# Patient Record
Sex: Male | Born: 1945 | Race: White | Hispanic: No | Marital: Married | Smoking: Former smoker
Health system: Southern US, Community
[De-identification: ages and names within clinical notes are randomized; demographics above are authoritative.]

## PROBLEM LIST (undated history)

## (undated) DIAGNOSIS — I2510 Atherosclerotic heart disease of native coronary artery without angina pectoris: Secondary | ICD-10-CM

## (undated) DIAGNOSIS — R0902 Hypoxemia: Secondary | ICD-10-CM

## (undated) DIAGNOSIS — G4730 Sleep apnea, unspecified: Secondary | ICD-10-CM

## (undated) DIAGNOSIS — E785 Hyperlipidemia, unspecified: Secondary | ICD-10-CM

## (undated) DIAGNOSIS — K810 Acute cholecystitis: Secondary | ICD-10-CM

## (undated) DIAGNOSIS — Z9981 Dependence on supplemental oxygen: Secondary | ICD-10-CM

## (undated) DIAGNOSIS — D509 Iron deficiency anemia, unspecified: Secondary | ICD-10-CM

## (undated) DIAGNOSIS — I5032 Chronic diastolic (congestive) heart failure: Secondary | ICD-10-CM

## (undated) DIAGNOSIS — J449 Chronic obstructive pulmonary disease, unspecified: Secondary | ICD-10-CM

## (undated) DIAGNOSIS — G4733 Obstructive sleep apnea (adult) (pediatric): Secondary | ICD-10-CM

## (undated) DIAGNOSIS — M1990 Unspecified osteoarthritis, unspecified site: Secondary | ICD-10-CM

## (undated) DIAGNOSIS — Z9842 Cataract extraction status, left eye: Secondary | ICD-10-CM

## (undated) DIAGNOSIS — Z952 Presence of prosthetic heart valve: Secondary | ICD-10-CM

## (undated) DIAGNOSIS — C801 Malignant (primary) neoplasm, unspecified: Secondary | ICD-10-CM

## (undated) DIAGNOSIS — I779 Disorder of arteries and arterioles, unspecified: Secondary | ICD-10-CM

## (undated) DIAGNOSIS — I5030 Unspecified diastolic (congestive) heart failure: Secondary | ICD-10-CM

## (undated) DIAGNOSIS — Z9841 Cataract extraction status, right eye: Secondary | ICD-10-CM

## (undated) DIAGNOSIS — I350 Nonrheumatic aortic (valve) stenosis: Secondary | ICD-10-CM

## (undated) DIAGNOSIS — I700 Atherosclerosis of aorta: Secondary | ICD-10-CM

## (undated) DIAGNOSIS — E039 Hypothyroidism, unspecified: Secondary | ICD-10-CM

## (undated) DIAGNOSIS — I10 Essential (primary) hypertension: Secondary | ICD-10-CM

## (undated) HISTORY — PX: CATARACT EXTRACTION: SUR2

## (undated) HISTORY — PX: EYE SURGERY: SHX253

## (undated) HISTORY — DX: Atherosclerotic heart disease of native coronary artery without angina pectoris: I25.10

## (undated) HISTORY — DX: Chronic diastolic (congestive) heart failure: I50.32

## (undated) HISTORY — DX: Nonrheumatic aortic (valve) stenosis: I35.0

## (undated) HISTORY — PX: VASECTOMY: SHX75

## (undated) HISTORY — DX: Chronic obstructive pulmonary disease, unspecified: J44.9

## (undated) HISTORY — DX: Hypothyroidism, unspecified: E03.9

## (undated) HISTORY — PX: CARDIAC CATHETERIZATION: SHX172

---

## 2008-08-24 DIAGNOSIS — D039 Melanoma in situ, unspecified: Secondary | ICD-10-CM

## 2008-08-24 HISTORY — DX: Melanoma in situ, unspecified: D03.9

## 2016-07-23 ENCOUNTER — Other Ambulatory Visit: Payer: Self-pay | Admitting: Family Medicine

## 2016-07-23 ENCOUNTER — Ambulatory Visit
Admission: RE | Admit: 2016-07-23 | Discharge: 2016-07-23 | Disposition: A | Discharge status: Home / Self Care | Payer: Medicare Other | Source: Ambulatory Visit | Attending: Family Medicine | Admitting: Family Medicine

## 2016-07-23 DIAGNOSIS — J9811 Atelectasis: Secondary | ICD-10-CM | POA: Diagnosis not present

## 2016-07-23 DIAGNOSIS — R0602 Shortness of breath: Secondary | ICD-10-CM

## 2016-07-23 DIAGNOSIS — S2241XA Multiple fractures of ribs, right side, initial encounter for closed fracture: Secondary | ICD-10-CM | POA: Insufficient documentation

## 2018-10-08 ENCOUNTER — Emergency Department: Payer: Medicare Other

## 2018-10-08 ENCOUNTER — Other Ambulatory Visit: Payer: Self-pay

## 2018-10-08 ENCOUNTER — Encounter: Payer: Self-pay | Admitting: Emergency Medicine

## 2018-10-08 ENCOUNTER — Inpatient Hospital Stay
Admission: EM | Admit: 2018-10-08 | Discharge: 2018-10-14 | DRG: 286 | Disposition: A | Discharge status: Home / Self Care | Payer: Medicare Other | Attending: Internal Medicine | Admitting: Internal Medicine

## 2018-10-08 DIAGNOSIS — R9431 Abnormal electrocardiogram [ECG] [EKG]: Secondary | ICD-10-CM | POA: Diagnosis not present

## 2018-10-08 DIAGNOSIS — J9690 Respiratory failure, unspecified, unspecified whether with hypoxia or hypercapnia: Secondary | ICD-10-CM

## 2018-10-08 DIAGNOSIS — J449 Chronic obstructive pulmonary disease, unspecified: Secondary | ICD-10-CM | POA: Diagnosis present

## 2018-10-08 DIAGNOSIS — E039 Hypothyroidism, unspecified: Secondary | ICD-10-CM | POA: Diagnosis present

## 2018-10-08 DIAGNOSIS — Z8249 Family history of ischemic heart disease and other diseases of the circulatory system: Secondary | ICD-10-CM

## 2018-10-08 DIAGNOSIS — I959 Hypotension, unspecified: Secondary | ICD-10-CM | POA: Diagnosis present

## 2018-10-08 DIAGNOSIS — I2510 Atherosclerotic heart disease of native coronary artery without angina pectoris: Secondary | ICD-10-CM | POA: Diagnosis present

## 2018-10-08 DIAGNOSIS — I5033 Acute on chronic diastolic (congestive) heart failure: Secondary | ICD-10-CM | POA: Diagnosis not present

## 2018-10-08 DIAGNOSIS — E871 Hypo-osmolality and hyponatremia: Secondary | ICD-10-CM | POA: Diagnosis present

## 2018-10-08 DIAGNOSIS — J101 Influenza due to other identified influenza virus with other respiratory manifestations: Secondary | ICD-10-CM | POA: Diagnosis present

## 2018-10-08 DIAGNOSIS — Z79890 Hormone replacement therapy (postmenopausal): Secondary | ICD-10-CM

## 2018-10-08 DIAGNOSIS — I248 Other forms of acute ischemic heart disease: Secondary | ICD-10-CM | POA: Diagnosis present

## 2018-10-08 DIAGNOSIS — A419 Sepsis, unspecified organism: Secondary | ICD-10-CM

## 2018-10-08 DIAGNOSIS — I110 Hypertensive heart disease with heart failure: Principal | ICD-10-CM | POA: Diagnosis present

## 2018-10-08 DIAGNOSIS — K5900 Constipation, unspecified: Secondary | ICD-10-CM | POA: Diagnosis present

## 2018-10-08 DIAGNOSIS — Z79899 Other long term (current) drug therapy: Secondary | ICD-10-CM

## 2018-10-08 DIAGNOSIS — I5023 Acute on chronic systolic (congestive) heart failure: Secondary | ICD-10-CM | POA: Diagnosis present

## 2018-10-08 DIAGNOSIS — Z6831 Body mass index (BMI) 31.0-31.9, adult: Secondary | ICD-10-CM

## 2018-10-08 DIAGNOSIS — E669 Obesity, unspecified: Secondary | ICD-10-CM | POA: Diagnosis present

## 2018-10-08 DIAGNOSIS — I5031 Acute diastolic (congestive) heart failure: Secondary | ICD-10-CM

## 2018-10-08 DIAGNOSIS — J9601 Acute respiratory failure with hypoxia: Secondary | ICD-10-CM | POA: Diagnosis not present

## 2018-10-08 DIAGNOSIS — I350 Nonrheumatic aortic (valve) stenosis: Secondary | ICD-10-CM

## 2018-10-08 DIAGNOSIS — I10 Essential (primary) hypertension: Secondary | ICD-10-CM | POA: Diagnosis not present

## 2018-10-08 DIAGNOSIS — J9621 Acute and chronic respiratory failure with hypoxia: Secondary | ICD-10-CM | POA: Diagnosis present

## 2018-10-08 DIAGNOSIS — Z880 Allergy status to penicillin: Secondary | ICD-10-CM | POA: Diagnosis not present

## 2018-10-08 DIAGNOSIS — Z87891 Personal history of nicotine dependence: Secondary | ICD-10-CM

## 2018-10-08 HISTORY — DX: Essential (primary) hypertension: I10

## 2018-10-08 HISTORY — DX: Respiratory failure, unspecified, unspecified whether with hypoxia or hypercapnia: J96.90

## 2018-10-08 LAB — INFLUENZA PANEL BY PCR (TYPE A & B)
Influenza A By PCR: POSITIVE — AB
Influenza B By PCR: NEGATIVE

## 2018-10-08 LAB — CBC WITH DIFFERENTIAL/PLATELET
Abs Immature Granulocytes: 0.01 10*3/uL (ref 0.00–0.07)
BASOS PCT: 1 %
Basophils Absolute: 0 10*3/uL (ref 0.0–0.1)
Eosinophils Absolute: 0 10*3/uL (ref 0.0–0.5)
Eosinophils Relative: 1 %
HCT: 48.8 % (ref 39.0–52.0)
Hemoglobin: 15.8 g/dL (ref 13.0–17.0)
Immature Granulocytes: 0 %
Lymphocytes Relative: 5 %
Lymphs Abs: 0.3 10*3/uL — ABNORMAL LOW (ref 0.7–4.0)
MCH: 29.5 pg (ref 26.0–34.0)
MCHC: 32.4 g/dL (ref 30.0–36.0)
MCV: 91.2 fL (ref 80.0–100.0)
MONOS PCT: 13 %
Monocytes Absolute: 0.7 10*3/uL (ref 0.1–1.0)
NEUTROS PCT: 80 %
Neutro Abs: 4.6 10*3/uL (ref 1.7–7.7)
Platelets: 243 10*3/uL (ref 150–400)
RBC: 5.35 MIL/uL (ref 4.22–5.81)
RDW: 12.6 % (ref 11.5–15.5)
WBC: 5.7 10*3/uL (ref 4.0–10.5)
nRBC: 0 % (ref 0.0–0.2)

## 2018-10-08 LAB — URINALYSIS, COMPLETE (UACMP) WITH MICROSCOPIC
Bacteria, UA: NONE SEEN
Bilirubin Urine: NEGATIVE
Glucose, UA: NEGATIVE mg/dL
Ketones, ur: NEGATIVE mg/dL
Leukocytes,Ua: NEGATIVE
Nitrite: NEGATIVE
PROTEIN: NEGATIVE mg/dL
Specific Gravity, Urine: 1.016 (ref 1.005–1.030)
Squamous Epithelial / HPF: NONE SEEN (ref 0–5)
pH: 6 (ref 5.0–8.0)

## 2018-10-08 LAB — COMPREHENSIVE METABOLIC PANEL
ALT: 12 U/L (ref 0–44)
AST: 20 U/L (ref 15–41)
Albumin: 3.9 g/dL (ref 3.5–5.0)
Alkaline Phosphatase: 50 U/L (ref 38–126)
Anion gap: 7 (ref 5–15)
BUN: 16 mg/dL (ref 8–23)
CO2: 35 mmol/L — ABNORMAL HIGH (ref 22–32)
Calcium: 8.5 mg/dL — ABNORMAL LOW (ref 8.9–10.3)
Chloride: 84 mmol/L — ABNORMAL LOW (ref 98–111)
Creatinine, Ser: 0.95 mg/dL (ref 0.61–1.24)
GFR calc Af Amer: >60 mL/min (ref >60)
GFR calc non Af Amer: >60 mL/min (ref >60)
Glucose, Bld: 91 mg/dL (ref 70–99)
Potassium: 3.7 mmol/L (ref 3.5–5.1)
Sodium: 126 mmol/L — ABNORMAL LOW (ref 135–145)
Total Bilirubin: 1.4 mg/dL — ABNORMAL HIGH (ref 0.3–1.2)
Total Protein: 6.9 g/dL (ref 6.5–8.1)

## 2018-10-08 LAB — LACTIC ACID, PLASMA: LACTIC ACID, VENOUS: 1.6 mmol/L (ref 0.5–1.9)

## 2018-10-08 LAB — PROTIME-INR
INR: 0.98
Prothrombin Time: 12.9 seconds (ref 11.4–15.2)

## 2018-10-08 LAB — TSH: TSH: 0.807 u[IU]/mL (ref 0.350–4.500)

## 2018-10-08 LAB — TROPONIN I
Troponin I: 0.05 ng/mL (ref <0.03)
Troponin I: 0.06 ng/mL (ref <0.03)
Troponin I: 0.05 ng/mL (ref <0.03)

## 2018-10-08 LAB — BRAIN NATRIURETIC PEPTIDE: B Natriuretic Peptide: 823 pg/mL — ABNORMAL HIGH (ref 0.0–100.0)

## 2018-10-08 MED ORDER — ORAL CARE MOUTH RINSE
15.0000 mL | Freq: Two times a day (BID) | OROMUCOSAL | Status: DC
  Administered 2018-10-09 – 2018-10-12 (×4): 15 mL via OROMUCOSAL

## 2018-10-08 MED ORDER — OSELTAMIVIR PHOSPHATE 75 MG PO CAPS
75.0000 mg | ORAL_CAPSULE | Freq: Two times a day (BID) | ORAL | Status: AC
  Administered 2018-10-08 – 2018-10-12 (×10): 75 mg via ORAL
  Filled 2018-10-08 (×10): qty 1

## 2018-10-08 MED ORDER — ONDANSETRON HCL 4 MG/2ML IJ SOLN: 4.0000 mg | Freq: Four times a day (QID) | INTRAMUSCULAR | Status: DC | PRN

## 2018-10-08 MED ORDER — SODIUM CHLORIDE 0.9 % IV BOLUS
500.0000 mL | Freq: Once | INTRAVENOUS | Status: AC
  Administered 2018-10-08: 500 mL via INTRAVENOUS

## 2018-10-08 MED ORDER — METHYLPREDNISOLONE SODIUM SUCC 125 MG IJ SOLR
125.0000 mg | Freq: Once | INTRAMUSCULAR | Status: AC
  Administered 2018-10-08: 125 mg via INTRAVENOUS
  Filled 2018-10-08: qty 2

## 2018-10-08 MED ORDER — SODIUM CHLORIDE 0.9% FLUSH
3.0000 mL | Freq: Two times a day (BID) | INTRAVENOUS | Status: DC
  Administered 2018-10-08 – 2018-10-14 (×12): 3 mL via INTRAVENOUS

## 2018-10-08 MED ORDER — ASPIRIN EC 81 MG PO TBEC
81.0000 mg | DELAYED_RELEASE_TABLET | Freq: Every day | ORAL | Status: DC
  Administered 2018-10-08 – 2018-10-14 (×7): 81 mg via ORAL
  Filled 2018-10-08 (×7): qty 1

## 2018-10-08 MED ORDER — BUDESONIDE 0.5 MG/2ML IN SUSP
0.5000 mg | Freq: Two times a day (BID) | RESPIRATORY_TRACT | Status: DC
  Administered 2018-10-08 – 2018-10-14 (×12): 0.5 mg via RESPIRATORY_TRACT
  Filled 2018-10-08 (×11): qty 2

## 2018-10-08 MED ORDER — CARVEDILOL 3.125 MG PO TABS
3.1250 mg | ORAL_TABLET | Freq: Two times a day (BID) | ORAL | Status: DC
  Administered 2018-10-08 – 2018-10-09 (×2): 3.125 mg via ORAL
  Filled 2018-10-08 (×4): qty 1

## 2018-10-08 MED ORDER — IPRATROPIUM-ALBUTEROL 0.5-2.5 (3) MG/3ML IN SOLN
3.0000 mL | Freq: Once | RESPIRATORY_TRACT | Status: AC
  Administered 2018-10-08: 3 mL via RESPIRATORY_TRACT

## 2018-10-08 MED ORDER — GUAIFENESIN ER 600 MG PO TB12
600.0000 mg | ORAL_TABLET | Freq: Two times a day (BID) | ORAL | Status: DC
  Administered 2018-10-08 – 2018-10-14 (×12): 600 mg via ORAL
  Filled 2018-10-08 (×12): qty 1

## 2018-10-08 MED ORDER — ACETAMINOPHEN 650 MG RE SUPP: 650.0000 mg | Freq: Four times a day (QID) | RECTAL | Status: DC | PRN

## 2018-10-08 MED ORDER — POLYETHYLENE GLYCOL 3350 17 G PO PACK
17.0000 g | PACK | Freq: Every day | ORAL | Status: DC | PRN
  Administered 2018-10-09 – 2018-10-11 (×3): 17 g via ORAL
  Filled 2018-10-08 (×3): qty 1

## 2018-10-08 MED ORDER — DOCUSATE SODIUM 100 MG PO CAPS
100.0000 mg | ORAL_CAPSULE | Freq: Two times a day (BID) | ORAL | Status: DC
  Administered 2018-10-08 – 2018-10-14 (×9): 100 mg via ORAL
  Filled 2018-10-08 (×11): qty 1

## 2018-10-08 MED ORDER — HYDRALAZINE HCL 20 MG/ML IJ SOLN: 10.0000 mg | INTRAMUSCULAR | Status: DC | PRN

## 2018-10-08 MED ORDER — IPRATROPIUM-ALBUTEROL 0.5-2.5 (3) MG/3ML IN SOLN
3.0000 mL | Freq: Once | RESPIRATORY_TRACT | Status: AC
  Administered 2018-10-08: 3 mL via RESPIRATORY_TRACT
  Filled 2018-10-08: qty 6

## 2018-10-08 MED ORDER — ACETAMINOPHEN 500 MG PO TABS
1000.0000 mg | ORAL_TABLET | Freq: Once | ORAL | Status: AC
  Administered 2018-10-08: 1000 mg via ORAL
  Filled 2018-10-08: qty 2

## 2018-10-08 MED ORDER — ENOXAPARIN SODIUM 40 MG/0.4ML ~~LOC~~ SOLN
40.0000 mg | SUBCUTANEOUS | Status: DC
  Administered 2018-10-08 – 2018-10-12 (×5): 40 mg via SUBCUTANEOUS
  Filled 2018-10-08 (×5): qty 0.4

## 2018-10-08 MED ORDER — FUROSEMIDE 10 MG/ML IJ SOLN
40.0000 mg | Freq: Two times a day (BID) | INTRAMUSCULAR | Status: DC
  Administered 2018-10-08 – 2018-10-09 (×2): 40 mg via INTRAVENOUS
  Filled 2018-10-08 (×2): qty 4

## 2018-10-08 MED ORDER — AMLODIPINE BESYLATE 5 MG PO TABS: 5.0000 mg | ORAL_TABLET | Freq: Every day | ORAL | Status: DC

## 2018-10-08 MED ORDER — LISINOPRIL 5 MG PO TABS
2.5000 mg | ORAL_TABLET | Freq: Every day | ORAL | Status: DC
  Administered 2018-10-09: 08:00:00 2.5 mg via ORAL
  Filled 2018-10-08: qty 1

## 2018-10-08 MED ORDER — IPRATROPIUM-ALBUTEROL 0.5-2.5 (3) MG/3ML IN SOLN
3.0000 mL | Freq: Four times a day (QID) | RESPIRATORY_TRACT | Status: DC
  Administered 2018-10-08 (×2): 3 mL via RESPIRATORY_TRACT
  Filled 2018-10-08: qty 3

## 2018-10-08 MED ORDER — SODIUM CHLORIDE 0.9% FLUSH: 3.0000 mL | Freq: Once | INTRAVENOUS | Status: DC

## 2018-10-08 MED ORDER — TRAZODONE HCL 50 MG PO TABS
50.0000 mg | ORAL_TABLET | Freq: Every evening | ORAL | Status: DC | PRN
  Filled 2018-10-08: qty 1

## 2018-10-08 MED ORDER — SODIUM CHLORIDE 0.9% FLUSH: 3.0000 mL | INTRAVENOUS | Status: DC | PRN

## 2018-10-08 MED ORDER — SODIUM CHLORIDE 1 G PO TABS
2.0000 g | ORAL_TABLET | Freq: Three times a day (TID) | ORAL | Status: DC
  Administered 2018-10-08 – 2018-10-09 (×3): 2 g via ORAL
  Filled 2018-10-08 (×4): qty 2

## 2018-10-08 MED ORDER — VITAMIN B-12 1000 MCG PO TABS
1000.0000 ug | ORAL_TABLET | Freq: Every day | ORAL | Status: DC
  Administered 2018-10-09 – 2018-10-14 (×6): 1000 ug via ORAL
  Filled 2018-10-08 (×6): qty 1

## 2018-10-08 MED ORDER — ACETAMINOPHEN 325 MG PO TABS
650.0000 mg | ORAL_TABLET | Freq: Four times a day (QID) | ORAL | Status: DC | PRN
  Administered 2018-10-09: 650 mg via ORAL
  Filled 2018-10-08: qty 2

## 2018-10-08 MED ORDER — LEVOTHYROXINE SODIUM 50 MCG PO TABS
50.0000 ug | ORAL_TABLET | Freq: Every day | ORAL | Status: DC
  Administered 2018-10-09 – 2018-10-14 (×6): 50 ug via ORAL
  Filled 2018-10-08 (×6): qty 1

## 2018-10-08 MED ORDER — VITAMIN D3 25 MCG (1000 UNIT) PO TABS
1000.0000 [IU] | ORAL_TABLET | Freq: Every day | ORAL | Status: DC
  Administered 2018-10-09 – 2018-10-14 (×6): 1000 [IU] via ORAL
  Filled 2018-10-08 (×6): qty 1

## 2018-10-08 MED ORDER — IPRATROPIUM-ALBUTEROL 0.5-2.5 (3) MG/3ML IN SOLN: 3.0000 mL | RESPIRATORY_TRACT | Status: DC | PRN

## 2018-10-08 MED ORDER — SODIUM CHLORIDE 0.9 % IV SOLN: 250.0000 mL | INTRAVENOUS | Status: DC | PRN

## 2018-10-08 MED ORDER — ONDANSETRON HCL 4 MG PO TABS: 4.0000 mg | ORAL_TABLET | Freq: Four times a day (QID) | ORAL | Status: DC | PRN

## 2018-10-08 NOTE — Progress Notes (Signed)
Family Meeting Note  Advance Directive:yes  Today a meeting took place with the Patient.  Patient is able to participate   The following clinical team members were present during this meeting:MD  The following were discussed:Patient's diagnosis:resp failure , Patient's progosis: Unable to determine and Goals for treatment: DNI  Additional follow-up to be provided: prn  Time spent during discussion:20 minutes  Gorden Harms, MD

## 2018-10-08 NOTE — ED Notes (Signed)
Pt started with SHOB 2-3 days ago. Denies other symptoms. No pain.

## 2018-10-08 NOTE — Progress Notes (Signed)
   10/08/18 1500  Clinical Encounter Type  Visited With Family (spouse Katharine Look)  Visit Type Initial (response to OR)  Referral From Nurse  Recommendations check in later today   Chaplain attempted to follow up on OR.  Per patient's spouse, patient in restroom.  Chaplain spoke with spouse for a short time and will circle back later today.

## 2018-10-08 NOTE — ED Notes (Signed)
Pt aware of need for urine specimen. 

## 2018-10-08 NOTE — Consult Note (Signed)
Cardiology Consultation:   Patient ID: [name] MRN: [id number]; DOB: March 10, 1946  Admit date: 10/08/2018 Date of Consult: 10/08/2018  Primary Care Provider: [facility] [place] Primary Cardiologist: New to [facility] Physician requesting consult: Dr. Jerelyn Charles Reason for consult: Shortness of breath, aortic valve disease   Patient Profile:   Kenneth Larsen is a 73 y.o. male with a hx of smoking for more than 25 years quit age 69, hypertension, chronic leg swelling, hypothyroidism, presenting with worsening shortness of breath, leg swelling  History of Present Illness:   Seen by urgent care October 08, 2018, reports of cough, leg edema, shortness of breath Was hypoxic in the clinic 87% on room air improved with 2 L nasal cannula Referred to the emergency room  Family in the room with him, they report worsening debility and shortness of breath since Thanksgiving 2019, severe dyspnea on exertion, progressive over the past 3 to 4 months Sleeping in a recliner for quite some time Likely undiagnosed sleep apnea per the family  He helps take care of his mother, does 75% of the caretaking Has had more difficulty Last mother-in-law, dealing with their estate   Patient reports he has had a murmur for many years, no prior cardiac evaluation  Recently noticed significant shortness of breath trying to pick up branches after a storm, to take frequent breaks  Symptoms worse in the past several days, fever Tested positive for influenza Fever 100.6 on arrival  Lab work in the emergency room sodium 126 BNP 800 troponin 0 0.05 Influenza A positive   Past Medical History:  Diagnosis Date  . Hypertension   Former smoker Hypothyroidism Obesity   History reviewed. No pertinent surgical history.   Home Medications:  Prior to Admission medications   Medication Sig Start Date End Date Taking? Authorizing Provider  amLODipine (NORVASC) 5 MG tablet Take 5 mg by mouth  daily. 09/19/18  Yes [provider]  Cholecalciferol (VITAMIN D-1000 MAX ST) 25 MCG (1000 UT) tablet Take 1,000 Units by mouth daily.   Yes [provider]  levothyroxine (SYNTHROID, LEVOTHROID) 50 MCG tablet Take 50 mcg by mouth daily.   Yes [provider]  triamterene-hydrochlorothiazide (MAXZIDE-25) 37.5-25 MG tablet Take 0.5 tablets by mouth daily. 09/19/18  Yes [provider]  vitamin B-12 (CYANOCOBALAMIN) 1000 MCG tablet Take 1,000 mcg by mouth daily.   Yes [provider]    Inpatient Medications: Scheduled Meds: . aspirin EC  81 mg Oral Daily  . budesonide (PULMICORT) nebulizer solution  0.5 mg Nebulization BID  . carvedilol  3.125 mg Oral BID WC  . [START ON 10/09/2018] cholecalciferol  1,000 Units Oral Daily  . docusate sodium  100 mg Oral BID  . enoxaparin (LOVENOX) injection  40 mg Subcutaneous Q24H  . furosemide  40 mg Intravenous BID  . guaiFENesin  600 mg Oral BID  . ipratropium-albuterol  3 mL Nebulization Q6H  . [START ON 10/09/2018] levothyroxine  50 mcg Oral Daily  . lisinopril  2.5 mg Oral Daily  . mouth rinse  15 mL Mouth Rinse BID  . oseltamivir  75 mg Oral BID  . sodium chloride flush  3 mL Intravenous Once  . sodium chloride flush  3 mL Intravenous Q12H  . sodium chloride  2 g Oral TID WC  . [START ON 10/09/2018] vitamin B-12  1,000 mcg Oral Daily   Continuous Infusions: . sodium chloride     PRN Meds: sodium chloride, acetaminophen **OR** acetaminophen, hydrALAZINE, ipratropium-albuterol, ondansetron **OR**  ondansetron (ZOFRAN) IV, polyethylene glycol, sodium chloride flush, traZODone  Allergies:    Allergies  Allergen Reactions  . Penicillins Hives    Social History:   Social History   Socioeconomic History  . Marital status: Married    Spouse name: Not on file  . Number of children: Not on file  . Years of education: Not on file  . Highest education level: Not on file  Occupational History  . Not on  file  Social Needs  . Financial resource strain: Not on file  . Food insecurity:    Worry: Not on file    Inability: Not on file  . Transportation needs:    Medical: Not on file    Non-medical: Not on file  Tobacco Use  . Smoking status: Former Research scientist (life sciences)  . Smokeless tobacco: Never Used  Substance and Sexual Activity  . Alcohol use: Not on file  . Drug use: Not on file  . Sexual activity: Not on file  Lifestyle  . Physical activity:    Days per week: Not on file    Minutes per session: Not on file  . Stress: Not on file  Relationships  . Social connections:    Talks on phone: Not on file    Gets together: Not on file    Attends religious service: Not on file    Active member of club or organization: Not on file    Attends meetings of clubs or organizations: Not on file    Relationship status: Not on file  . Intimate partner violence:    Fear of current or ex partner: Not on file    Emotionally abused: Not on file    Physically abused: Not on file    Forced sexual activity: Not on file  Other Topics Concern  . Not on file  Social History Narrative  . Not on file    Family History:   *History reviewed. No pertinent family history.   ROS:  Please see the history of present illness.  Review of Systems  Constitutional: Positive for malaise/fatigue.  Respiratory: Positive for shortness of breath.   Cardiovascular: Positive for leg swelling.  Gastrointestinal: Negative.   Musculoskeletal: Negative.   Neurological: Negative.   Psychiatric/Behavioral: Negative.   All other systems reviewed and are negative.   Physical Exam/Data:   Vitals:   10/08/18 1339 10/08/18 1400 10/08/18 1428 10/08/18 1518  BP:  116/68 116/63   Pulse:  89 85   Resp:  (!) 30 16   Temp: 98.3 F (36.8 C)  98.5 F (36.9 C)   TempSrc:   Oral   SpO2:  90% 96% 93%  Weight:      Height:   6\' 1"  (1.854 m)     Intake/Output Summary (Last 24 hours) at 10/08/2018 1810 Last data filed at 10/08/2018  1706 Gross per 24 hour  Intake 831 ml  Output 1025 ml  Net -194 ml   Last 3 Weights 10/08/2018  Weight (lbs) 240 lb  Weight (kg) 108.863 kg     Body mass index is 31.66 kg/m.  General:  Well nourished, well developed,  mild respiratory distress, obese HEENT: normal Lymph: no adenopathy Neck: no JVD Endocrine:  No thryomegaly Vascular: No carotid bruits; FA pulses 2+ bilaterally without bruits  Cardiac:  normal S1, S2; RRR; 3/6 systolic ejection murmur heard right sternal border radiating to the left Trace to 1+ pitting edema lower extremities Lungs:  clear to auscultation bilaterally, no wheezing, rhonchi or rales  Abd: soft, nontender, no hepatomegaly  Ext: no edema Musculoskeletal:  No deformities, BUE and BLE strength normal and equal Skin: warm and dry  Neuro:  CNs 2-12 intact, no focal abnormalities noted Psych:  Normal affect   EKG:  The EKG was personally reviewed and demonstrates: Normal sinus rhythm with rate 87 bpm  voltage criteria for LVH likely with repolarization abnormality in V5, V6, 1 and aVL lead II, PVCs and quadrigeminal pattern  Telemetry:  Telemetry was personally reviewed and demonstrates: Normal sinus rhythm  Relevant CV Studies: Echocardiogram pending  Laboratory Data:  Chemistry Recent Labs  Lab 10/08/18 1020  NA 126*  K 3.7  CL 84*  CO2 35*  GLUCOSE 91  BUN 16  CREATININE 0.95  CALCIUM 8.5*  GFRNONAA >60  GFRAA >60  ANIONGAP 7    Recent Labs  Lab 10/08/18 1020  PROT 6.9  ALBUMIN 3.9  AST 20  ALT 12  ALKPHOS 50  BILITOT 1.4*   Hematology Recent Labs  Lab 10/08/18 1020  WBC 5.7  RBC 5.35  HGB 15.8  HCT 48.8  MCV 91.2  MCH 29.5  MCHC 32.4  RDW 12.6  PLT 243   Cardiac Enzymes Recent Labs  Lab 10/08/18 1020 10/08/18 1445  TROPONINI 0.05* 0.06*   No results for input(s): TROPIPOC in the last 168 hours.  BNP Recent Labs  Lab 10/08/18 1020  BNP 823.0*    DDimer No results for input(s): DDIMER in the last 168  hours.  Radiology/Studies:  Dg Chest Port 1 View  Result Date: 10/08/2018 CLINICAL DATA:  Shortness of breath and hypoxia. EXAM: PORTABLE CHEST 1 VIEW COMPARISON:  07/23/2016 FINDINGS: Stable and normal heart size. Stable mild tortuosity and calcified plaque in the thoracic aorta. Lungs show evidence pulmonary venous hypertension and dilated central pulmonary vessels. No overt airspace edema or visualized pleural fluid. Healed posterior right rib fractures. 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 Electronically Signed   By: Aletta Edouard M.D.   On: 10/08/2018 10:49    Assessment and Plan:   1. Acute respiratory distress Likely multifactorial including aortic valve disease, influenza A, mild COPD, Unable to exclude CHF, echo pending -Lower extremity edema likely dependent edema -BNP is elevated but in the setting of flu is indeterminate Agree with gentle diuresis, Tamiflu, nebulizers  2) aortic valve disease Clinical exam concerning for aortic valve stenosis, plus to be severe Pathology discussed with patient and family at the bedside Discussed possible treatment options if needed including surgery versus TAVR -Recommended we wait until echocardiogram results available  3) hypertension Well-controlled on current medications  4) influenza A Supportive, Tamiflu, nebulizers given reactive airway disease  5) hypothyroidism Continue outpatient regiment  6) abnormal EKG In the setting of minimally elevated troponin and respiratory distress Echocardiogram pending, likely LVH with repolarization abnormality  7) hyponatremia Reports having high fluid intake Mountain Dew and ice tea Would recommend free water restriction Close monitoring with diuresis  8) elevated troponin Demand ischemia in the setting of respiratory distress, hypoxia Unable to exclude underlying coronary  disease Suspect LVH based off EKG   Long discussion with son who is a Marine scientist and other family concerning the above In particular focused on aortic valve disease, elevated troponins, progressive shortness of breath symptoms and flu symptoms  Total encounter time more than 110 minutes  Greater than 50% was spent in counseling and coordination of care with the patient   For questions  or updates, please contact [place] Please consult [email or web address] for contact info under     Signed, Ida Rogue, MD  10/08/2018 6:10 PM

## 2018-10-08 NOTE — H&P (Signed)
[facility] [place] NAME: Kenneth Larsen    MR#:  [id number]  DATE OF BIRTH:  11/27/1945  DATE OF ADMISSION:  10/08/2018  PRIMARY CARE PHYSICIAN: [facility] [place]   REQUESTING/REFERRING PHYSICIAN:   CHIEF COMPLAINT:   Chief Complaint  Patient presents with  . Shortness of Breath    HISTORY OF PRESENT ILLNESS: Kenneth Larsen  is a 73 y.o. male with a known history per below presenting to the emergency room with 2 to 3-day history of worsening shortness of breath, worsening leg swelling, was seen in neuro clinic earlier today found to be hypoxic with O2 saturation in the 80s, placed on oxygen via nasal cannula to the emergency room, ER work-up noted for pulmonary vascular hypertension on chest x-ray, UA negative, sodium 126, chloride 84, bicarb 35, BNP greater than 800, troponin 0.05, influenza A positive, patient evaluated in the emergency room, multiple family members present, patient in no apparent distress, resting comfortably in bed, per wife-patient unable to lay flat in bed has to sit propped up, leg edema worsening noted, patient is now being admitted for acute hypoxic respiratory failure most likely secondary to combination of acute influenza A infection and new onset congestive heart failure exacerbation.  PAST MEDICAL HISTORY:   Past Medical History:  Diagnosis Date  . Hypertension     PAST SURGICAL HISTORY:  None  SOCIAL HISTORY:  Social History   Tobacco Use  . Smoking status: Former Research scientist (life sciences)  . Smokeless tobacco: Never Used  Substance Use Topics  . Alcohol use: Not on file    FAMILY HISTORY:  Hypertension  DRUG ALLERGIES:  Allergies  Allergen Reactions  . Penicillins Hives    REVIEW OF SYSTEMS:   CONSTITUTIONAL: + fever, fatigue, weakness.  EYES: No blurred or double vision.  EARS, NOSE, AND THROAT: No tinnitus or ear pain.  RESPIRATORY: + cough, shortness of breath, no wheezing, hemoptysis.   CARDIOVASCULAR: No chest pain, + orthopnea, edema.  GASTROINTESTINAL: No nausea, vomiting, diarrhea or abdominal pain.  GENITOURINARY: No dysuria, hematuria.  ENDOCRINE: No polyuria, nocturia,  HEMATOLOGY: No anemia, easy bruising or bleeding SKIN: No rash or lesion. MUSCULOSKELETAL: No joint pain or arthritis.   NEUROLOGIC: No tingling, numbness, weakness.  PSYCHIATRY: No anxiety or depression.   MEDICATIONS AT HOME:  Prior to Admission medications   Medication Sig Start Date End Date Taking? Authorizing Provider  amLODipine (NORVASC) 5 MG tablet Take 5 mg by mouth daily. 09/19/18  Yes [provider]  Cholecalciferol (VITAMIN D-1000 MAX ST) 25 MCG (1000 UT) tablet Take 1,000 Units by mouth daily.   Yes [provider]  levothyroxine (SYNTHROID, LEVOTHROID) 50 MCG tablet Take 50 mcg by mouth daily.   Yes [provider]  triamterene-hydrochlorothiazide (MAXZIDE-25) 37.5-25 MG tablet Take 0.5 tablets by mouth daily. 09/19/18  Yes [provider]  vitamin B-12 (CYANOCOBALAMIN) 1000 MCG tablet Take 1,000 mcg by mouth daily.   Yes [provider]      PHYSICAL EXAMINATION:   VITAL SIGNS: Blood pressure 113/68, pulse 87, temperature (!) 100.6 F (38.1 C), temperature source Oral, resp. rate (!) 29, height 6' (1.829 m), weight 108.9 kg, SpO2 94 %.  GENERAL:  73 y.o.-year-old patient lying in the bed with no acute distress.  obesity, nontoxic-appearing EYES: Pupils equal, round, reactive to light and accommodation. No scleral icterus. Extraocular muscles intact.  HEENT: Head atraumatic, normocephalic. Oropharynx and nasopharynx clear.  NECK:  Supple, no jugular venous distention. No  thyroid enlargement, no tenderness.  LUNGS: Bilateral diminished breath sounds with Rales up to mid back. Mild  use of accessory muscles of respiration.  CARDIOVASCULAR: S1, S2 normal. No murmurs, rubs, or gallops.  ABDOMEN: Soft, nontender, nondistended. Bowel sounds  present. No organomegaly or mass.  EXTREMITIES: Lateral lower extremity pitting edema, no cyanosis, or clubbing.  NEUROLOGIC: Cranial nerves II through XII are intact. MAES. Gait not checked.  PSYCHIATRIC: The patient is alert and oriented x 3.  SKIN: No obvious rash, lesion, or ulcer.   LABORATORY PANEL:   CBC Recent Labs  Lab 10/08/18 1020  WBC 5.7  HGB 15.8  HCT 48.8  PLT 243  MCV 91.2  MCH 29.5  MCHC 32.4  RDW 12.6  LYMPHSABS 0.3*  MONOABS 0.7  EOSABS 0.0  BASOSABS 0.0   ------------------------------------------------------------------------------------------------------------------  Chemistries  Recent Labs  Lab 10/08/18 1020  NA 126*  K 3.7  CL 84*  CO2 35*  GLUCOSE 91  BUN 16  CREATININE 0.95  CALCIUM 8.5*  AST 20  ALT 12  ALKPHOS 50  BILITOT 1.4*   ------------------------------------------------------------------------------------------------------------------ estimated creatinine clearance is 88.3 mL/min (by C-G formula based on SCr of 0.95 mg/dL). ------------------------------------------------------------------------------------------------------------------ No results for input(s): TSH, T4TOTAL, T3FREE, THYROIDAB in the last 72 hours.  Invalid input(s): FREET3   Coagulation profile Recent Labs  Lab 10/08/18 1020  INR 0.98   ------------------------------------------------------------------------------------------------------------------- No results for input(s): DDIMER in the last 72 hours. -------------------------------------------------------------------------------------------------------------------  Cardiac Enzymes Recent Labs  Lab 10/08/18 1020  TROPONINI 0.05*   ------------------------------------------------------------------------------------------------------------------ Invalid input(s):  POCBNP  ---------------------------------------------------------------------------------------------------------------  Urinalysis    Component Value Date/Time   COLORURINE YELLOW (A) 10/08/2018 1020   APPEARANCEUR CLEAR (A) 10/08/2018 1020   LABSPEC 1.016 10/08/2018 1020   PHURINE 6.0 10/08/2018 1020   GLUCOSEU NEGATIVE 10/08/2018 1020   HGBUR SMALL (A) 10/08/2018 1020   BILIRUBINUR NEGATIVE 10/08/2018 1020   KETONESUR NEGATIVE 10/08/2018 1020   PROTEINUR NEGATIVE 10/08/2018 1020   NITRITE NEGATIVE 10/08/2018 1020   LEUKOCYTESUR NEGATIVE 10/08/2018 1020     RADIOLOGY: Dg Chest Port 1 View  Result Date: 10/08/2018 CLINICAL DATA:  Shortness of breath and hypoxia. EXAM: PORTABLE CHEST 1 VIEW COMPARISON:  07/23/2016 FINDINGS: Stable and normal heart size. Stable mild tortuosity and calcified plaque in the thoracic aorta. Lungs show evidence pulmonary venous hypertension and dilated central pulmonary vessels. No overt airspace edema or visualized pleural fluid. Healed posterior right rib fractures. 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 Electronically Signed   By: Aletta Edouard M.D.   On: 10/08/2018 10:49    EKG: Orders placed or performed during the hospital encounter of 10/08/18  . EKG 12-Lead  . EKG 12-Lead    IMPRESSION AND PLAN: *Acute hypoxic respiratory failure Suspect due to multifactorial process that includes acute influenza A infection and probable acute new onset congestive heart failure exacerbation Admit to regular nursing for bed, supplemental oxygen wean as tolerated  *Acute influenza A infection Tamiflu twice daily, supportive care  *Acute probable new onset congestive heart failure exacerbation Most likely exacerbated by acute influenza A infection Congestive heart failure protocol, strict I&O monitoring, daily weights, IV Lasix twice daily, cardiology to  see, check echocardiogram, low-dose Coreg/lisinopril if blood pressure will tolerate, aspirin, and continue close medical monitoring  *Acute elevated troponins Most likely secondary to demand ischemia from above disease process states Continue to cycle cardiac enzymes, follow-up on echocardiogram  *Chronic benign essential hypertension Currently with  low normal blood pressures, hold antihypertensives, vitals per routine, make changes as per necessary  *Chronic obesity Most likely secondary to excess calories Lifestyle modification recommended  All the records are reviewed and case discussed with ED provider. Management plans discussed with the patient, family and they are in agreement.  CODE STATUS:DNI  TOTAL TIME TAKING CARE OF THIS PATIENT: 40 minutes.    Avel Peace Salary M.D on 10/08/2018   Between 7am to 6pm - Pager - [phone]  After 6pm go to [email or web address] - password [email or web address] [facility]  Office  [phone]  CC: Primary care physician; [facility]   Note: This dictation was prepared with Dragon dictation along with smaller phrase technology. Any transcriptional errors that result from this process are unintentional.

## 2018-10-08 NOTE — ED Notes (Signed)
Attempted to call report. Per floor room being changed and will let RN know when room is clean.

## 2018-10-08 NOTE — Progress Notes (Signed)
New admission from ED with + flu/respiratory failure. Troponin 0. 06 text paged to Dr. Jerelyn Charles.

## 2018-10-08 NOTE — ED Triage Notes (Signed)
Patient presents to the ED from [facility] for shortness of breath.  Per [facility] staff, patient's oxygen saturation was 82% on room air in the office.  Patient was placed on 2L/[place] by office staff.  Patient does not normally wear oxygen.  Patient is also complaining of left ankle swelling that is more than usual.  Patient reports bronchitis in December.

## 2018-10-08 NOTE — ED Provider Notes (Signed)
[facility] Emergency Department Provider Note ____________________________________________   First MD Initiated Contact with Patient 10/08/18 1016     (approximate)  I have reviewed the triage vital signs and the nursing notes.   HISTORY  Chief Complaint Shortness of Breath    HPI Kenneth Larsen is a 73 y.o. male with PMH of hypertension who presents with worsening shortness of breath, gradual onset over the last few weeks, but acutely worsened in the last 2 to 3 days.  It is associated with generalized weakness.  The patient did not note that he had a fever until he came to the hospital.  He was found to be hypoxic when he went to the outpatient clinic.  He is not on home O2 normally.  The patient also reports lower extremity swelling but this is chronic.  He denies vomiting or diarrhea, or any urinary symptoms.   Past Medical History:  Diagnosis Date  . Hypertension     Patient Active Problem List   Diagnosis Date Noted  . Respiratory failure ([place]) 10/08/2018    History reviewed. No pertinent surgical history.  Prior to Admission medications   Medication Sig Start Date End Date Taking? Authorizing Provider  amLODipine (NORVASC) 5 MG tablet Take 5 mg by mouth daily. 09/19/18  Yes [provider]  Cholecalciferol (VITAMIN D-1000 MAX ST) 25 MCG (1000 UT) tablet Take 1,000 Units by mouth daily.   Yes [provider]  levothyroxine (SYNTHROID, LEVOTHROID) 50 MCG tablet Take 50 mcg by mouth daily.   Yes [provider]  triamterene-hydrochlorothiazide (MAXZIDE-25) 37.5-25 MG tablet Take 0.5 tablets by mouth daily. 09/19/18  Yes [provider]  vitamin B-12 (CYANOCOBALAMIN) 1000 MCG tablet Take 1,000 mcg by mouth daily.   Yes [provider]    Allergies Penicillins  No family history on file.  Social History Social History   Tobacco Use  . Smoking status: Former Research scientist (life sciences)  . Smokeless tobacco: Never  Used  Substance Use Topics  . Alcohol use: Not on file  . Drug use: Not on file    Review of Systems  Constitutional: Positive for fever. Eyes: No redness. ENT: No sore throat. Cardiovascular: Denies chest pain. Respiratory: Positive for shortness of breath. Gastrointestinal: No vomiting or diarrhea.  Genitourinary: Negative for dysuria.  Musculoskeletal: Negative for back pain. Skin: Negative for rash. Neurological: Negative for headache.   ____________________________________________   PHYSICAL EXAM:  VITAL SIGNS: ED Triage Vitals  Enc Vitals Group     BP 10/08/18 1003 (!) 115/59     Pulse Rate 10/08/18 1003 88     Resp 10/08/18 1003 (!) 36     Temp 10/08/18 1003 (!) 100.6 F (38.1 C)     Temp Source 10/08/18 1003 Oral     SpO2 10/08/18 1003 98 %     Weight 10/08/18 1004 240 lb (108.9 kg)     Height 10/08/18 1004 6' (1.829 m)     Head Circumference --      Peak Flow --      Pain Score 10/08/18 1004 0     Pain Loc --      Pain Edu? --      Excl. in [place]? --     Constitutional: Alert and oriented.  Uncomfortable appearing.   Eyes: Conjunctivae are normal.  Head: Atraumatic. Nose: No congestion/rhinnorhea. Mouth/Throat: Mucous membranes are moist.   Neck: Normal range of motion.  Cardiovascular: Borderline tachycardic, regular rhythm. Grossly normal heart sounds.  Good  peripheral circulation. Respiratory: Normal respiratory effort.  No retractions.  Decreased breath sounds bilaterally with no significant wheezing or rales. Gastrointestinal: Soft and nontender. No distention.  Genitourinary: No flank tenderness. Musculoskeletal: Trace bilateral lower extremity edema.  Extremities warm and well perfused.  Neurologic:  Normal speech and language. No gross focal neurologic deficits are appreciated.  Skin:  Skin is warm and dry. No rash noted. Psychiatric: Mood and affect are normal. Speech and behavior are normal.  ____________________________________________     LABS (all labs ordered are listed, but only abnormal results are displayed)  Labs Reviewed  COMPREHENSIVE METABOLIC PANEL - Abnormal; Notable for the following components:      Result Value   Sodium 126 (*)    Chloride 84 (*)    CO2 35 (*)    Calcium 8.5 (*)    Total Bilirubin 1.4 (*)    All other components within normal limits  CBC WITH DIFFERENTIAL/PLATELET - Abnormal; Notable for the following components:   Lymphs Abs 0.3 (*)    All other components within normal limits  URINALYSIS, COMPLETE (UACMP) WITH MICROSCOPIC - Abnormal; Notable for the following components:   Color, Urine YELLOW (*)    APPearance CLEAR (*)    Hgb urine dipstick SMALL (*)    All other components within normal limits  INFLUENZA PANEL BY PCR (TYPE A & B) - Abnormal; Notable for the following components:   Influenza A By PCR POSITIVE (*)    All other components within normal limits  BRAIN NATRIURETIC PEPTIDE - Abnormal; Notable for the following components:   B Natriuretic Peptide 823.0 (*)    All other components within normal limits  TROPONIN I - Abnormal; Notable for the following components:   Troponin I 0.05 (*)    All other components within normal limits  CULTURE, BLOOD (ROUTINE X 2)  CULTURE, BLOOD (ROUTINE X 2)  LACTIC ACID, PLASMA  PROTIME-INR  LACTIC ACID, PLASMA   ____________________________________________  EKG  ED ECG REPORT I, Arta Silence, the attending physician, personally viewed and interpreted this ECG.  Date: 10/08/2018 EKG Time: 1004 Rate: 87 Rhythm: normal sinus rhythm QRS Axis: normal Intervals: normal ST/T Wave abnormalities: LVH with repolarization abnormality and nonspecific ST abnormalities Narrative Interpretation: Nonspecific abnormalities; no prior EKG available for comparison  ____________________________________________  RADIOLOGY  CXR: Findings of pulmonary hypertension with no edema no focal  infiltrate  ____________________________________________   PROCEDURES  Procedure(s) performed: No  Procedures  Critical Care performed: No ____________________________________________   INITIAL IMPRESSION / ASSESSMENT AND PLAN / ED COURSE  Pertinent labs & imaging results that were available during my care of the patient were reviewed by me and considered in my medical decision making (see chart for details).  73 year old male with PMH as noted above presents with gradual onset of shortness of breath over the last several weeks, acutely worsened in approximately the last 2 days and associated today with fever.  The patient went to the outpatient clinic and was found to be hypoxic to the low 80s on room air.  He is not normally on home O2.  I reviewed the past medical records in Epic but the patient does not have any recent ED visits or admissions here.  On exam the patient is uncomfortable appearing and tachypneic.  He has a low-grade fever and borderline tachycardia.  His blood pressure is normal.  O2 saturation was in the low 80s on arrival but in the high 90s on nasal cannula.  The remainder of the exam  is as described above with decreased breath sounds bilaterally.  Differential includes new onset CHF, other cardiac etiology, influenza or other viral infection, or pneumonia.  The patient has no history of COPD but has had several episodes of bronchitis in the past.  We will obtain chest x-ray, lab work-up, and reassess.  I will give bronchodilators and steroid.  ----------------------------------------- 1:24 PM on 10/08/2018 -----------------------------------------  Patient is influenza A positive.  Chest x-ray shows no focal infiltrate.  His sodium is also low.  I have ordered a small saline bolus.  Given the hypoxia he will require admission.  I discussed the results of the work-up with the patient and his wife.  I signed the patient out to the hospitalist Dr. Jerelyn Charles for  admission. ____________________________________________   FINAL CLINICAL IMPRESSION(S) / ED DIAGNOSES  Final diagnoses:  Acute on chronic respiratory failure with hypoxia (HCC)  Influenza A      NEW MEDICATIONS STARTED DURING THIS VISIT:  New Prescriptions   No medications on file     Note:  This document was prepared using Dragon voice recognition software and may include unintentional dictation errors.    Arta Silence, MD 10/08/18 [phone]

## 2018-10-08 NOTE — ED Notes (Signed)
ED TO INPATIENT HANDOFF REPORT  ED Nurse Name and Phone #: Elan Mcelvain 57  S Name/Age/Gender Kenneth Larsen 73 y.o. male Room/Bed: ED13A/ED13A  Code Status   Code Status: Not on file  Home/SNF/Other Home Patient oriented to: X 4 Is this baseline? Yes   Triage Complete: Triage complete  Chief Complaint left ankle swollen SOB  Triage Note Patient presents to the ED from [facility] for shortness of breath.  Per [facility] staff, patient's oxygen saturation was 82% on room air in the office.  Patient was placed on 2L/[place] by office staff.  Patient does not normally wear oxygen.  Patient is also complaining of left ankle swelling that is more than usual.  Patient reports bronchitis in December.     Allergies Allergies  Allergen Reactions  . Penicillins Hives    Level of Care/Admitting Diagnosis ED Disposition    ED Disposition Condition [facility] Area: [place] [[id number]]  Level of Care: Med-Surg [16]  Diagnosis: Respiratory failure [facility]) [[id number]]  Admitting Physician: Gorden Harms [[id number]]  Attending Physician: Gorden Harms [[id number]]  Estimated length of stay: past midnight tomorrow  Certification:: I certify this patient will need inpatient services for at least 2 midnights  PT Class (Do Not Modify): Inpatient [101]  PT Acc Code (Do Not Modify): Private [1]       B Medical/Surgery History Past Medical History:  Diagnosis Date  . Hypertension    History reviewed. No pertinent surgical history.   A IV Location/Drains/Wounds Patient Lines/Drains/Airways Status   Active Line/Drains/Airways    Name:   Placement date:   Placement time:   Site:   Days:   Peripheral IV 10/08/18 Right Antecubital   10/08/18    1020    Antecubital   less than 1   Peripheral IV 10/08/18 Right Hand   10/08/18    1023    Hand   less than 1          Intake/Output Last 24 hours No intake or output data in the 24 hours ending 10/08/18  1346  Labs/Imaging Results for orders placed or performed during the hospital encounter of 10/08/18 (from the past 48 hour(s))  Comprehensive metabolic panel     Status: Abnormal   Collection Time: 10/08/18 10:20 AM  Result Value Ref Range   Sodium 126 (L) 135 - 145 mmol/L   Potassium 3.7 3.5 - 5.1 mmol/L   Chloride 84 (L) 98 - 111 mmol/L   CO2 35 (H) 22 - 32 mmol/L   Glucose, Bld 91 70 - 99 mg/dL   BUN 16 8 - 23 mg/dL   Creatinine, Ser 0.95 0.61 - 1.24 mg/dL   Calcium 8.5 (L) 8.9 - 10.3 mg/dL   Total Protein 6.9 6.5 - 8.1 g/dL   Albumin 3.9 3.5 - 5.0 g/dL   AST 20 15 - 41 U/L   ALT 12 0 - 44 U/L   Alkaline Phosphatase 50 38 - 126 U/L   Total Bilirubin 1.4 (H) 0.3 - 1.2 mg/dL   GFR calc non Af Amer >60 >60 mL/min   GFR calc Af Amer >60 >60 mL/min   Anion gap 7 5 - 15    Comment: Performed at [facility], [address] [place], [place] [address]  Lactic acid, plasma     Status: None   Collection Time: 10/08/18 10:20 AM  Result Value Ref Range   Lactic Acid, Venous 1.6 0.5 - 1.9 mmol/L  Comment: Performed at [facility], [address] [place], [place] [address]  CBC with Differential     Status: Abnormal   Collection Time: 10/08/18 10:20 AM  Result Value Ref Range   WBC 5.7 4.0 - 10.5 K/uL   RBC 5.35 4.22 - 5.81 MIL/uL   Hemoglobin 15.8 13.0 - 17.0 g/dL   HCT 48.8 39.0 - 52.0 %   MCV 91.2 80.0 - 100.0 fL   MCH 29.5 26.0 - 34.0 pg   MCHC 32.4 30.0 - 36.0 g/dL   RDW 12.6 11.5 - 15.5 %   Platelets 243 150 - 400 K/uL   nRBC 0.0 0.0 - 0.2 %   Neutrophils Relative % 80 %   Neutro Abs 4.6 1.7 - 7.7 K/uL   Lymphocytes Relative 5 %   Lymphs Abs 0.3 (L) 0.7 - 4.0 K/uL   Monocytes Relative 13 %   Monocytes Absolute 0.7 0.1 - 1.0 K/uL   Eosinophils Relative 1 %   Eosinophils Absolute 0.0 0.0 - 0.5 K/uL   Basophils Relative 1 %   Basophils Absolute 0.0 0.0 - 0.1 K/uL   Immature Granulocytes 0 %   Abs Immature Granulocytes 0.01 0.00 - 0.07 K/uL     Comment: Performed at [facility], [address] [place], [place] [address]  Protime-INR     Status: None   Collection Time: 10/08/18 10:20 AM  Result Value Ref Range   Prothrombin Time 12.9 11.4 - 15.2 seconds   INR 0.98     Comment: Performed at [facility], [address] [place], [place] [address]  Urinalysis, Complete w Microscopic     Status: Abnormal   Collection Time: 10/08/18 10:20 AM  Result Value Ref Range   Color, Urine YELLOW (A) YELLOW   APPearance CLEAR (A) CLEAR   Specific Gravity, Urine 1.016 1.005 - 1.030   pH 6.0 5.0 - 8.0   Glucose, UA NEGATIVE NEGATIVE mg/dL   Hgb urine dipstick SMALL (A) NEGATIVE   Bilirubin Urine NEGATIVE NEGATIVE   Ketones, ur NEGATIVE NEGATIVE mg/dL   Protein, ur NEGATIVE NEGATIVE mg/dL   Nitrite NEGATIVE NEGATIVE   Leukocytes,Ua NEGATIVE NEGATIVE   RBC / HPF 0-5 0 - 5 RBC/hpf   WBC, UA 0-5 0 - 5 WBC/hpf   Bacteria, UA NONE SEEN NONE SEEN   Squamous Epithelial / LPF NONE SEEN 0 - 5   Mucus PRESENT     Comment: Performed at [facility], [address] [place], [place] [address]  Influenza panel by PCR (type A & B)     Status: Abnormal   Collection Time: 10/08/18 10:20 AM  Result Value Ref Range   Influenza A By PCR POSITIVE (A) NEGATIVE   Influenza B By PCR NEGATIVE NEGATIVE    Comment: (NOTE) The Xpert Xpress Flu assay is intended as an aid in the diagnosis of  influenza and should not be used as a sole basis for treatment.  This  assay is FDA approved for nasopharyngeal swab specimens only. Nasal  washings and aspirates are unacceptable for Xpert Xpress Flu testing. Performed at [facility], [address] [place], [place] [address]   Brain natriuretic peptide     Status: Abnormal   Collection Time: 10/08/18 10:20 AM  Result Value Ref Range   B Natriuretic Peptide 823.0 (H) 0.0 - 100.0 pg/mL    Comment: Performed at [facility], [address], [place], [place] [address]   Troponin I - Add-On to  previous collection     Status: Abnormal   Collection Time: 10/08/18 10:20 AM  Result Value Ref Range   Troponin I 0.05 (HH) <0.03 ng/mL    Comment: CRITICAL RESULT CALLED TO, READ BACK BY AND VERIFIED WITH Kimala Horne AT 1129 10/08/2018 DAS Performed at [facility], [address] [place], [place] [address]    Dg Chest Port 1 View  Result Date: 10/08/2018 CLINICAL DATA:  Shortness of breath and hypoxia. EXAM: PORTABLE CHEST 1 VIEW COMPARISON:  07/23/2016 FINDINGS: Stable and normal heart size. Stable mild tortuosity and calcified plaque in the thoracic aorta. Lungs show evidence pulmonary venous hypertension and dilated central pulmonary vessels. No overt airspace edema or visualized pleural fluid. Healed posterior right rib fractures. 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 Electronically Signed   By: Aletta Edouard M.D.   On: 10/08/2018 10:49    Pending Labs Unresulted Labs (From admission, onward)    Start     Ordered   10/08/18 1006  Culture, blood (Routine x 2)  BLOOD CULTURE X 2,   STAT     10/08/18 1006   Signed and Held  CBC  (enoxaparin (LOVENOX)    CrCl >/= 30 ml/min)  Once,   R    Comments:  Baseline for enoxaparin therapy IF NOT ALREADY DRAWN.  Notify MD if PLT < 100 K.    Signed and Held   Signed and Held  Creatinine, serum  (enoxaparin (LOVENOX)    CrCl >/= 30 ml/min)  Once,   R    Comments:  Baseline for enoxaparin therapy IF NOT ALREADY DRAWN.    Signed and Held   Signed and Held  Creatinine, serum  (enoxaparin (LOVENOX)    CrCl >/= 30 ml/min)  Weekly,   R    Comments:  while on enoxaparin therapy    Signed and Held   Signed and Held  TSH  Once,   R     Signed and Held   Signed and Held  Troponin I - Now Then Q6H  Now then every 6 hours,   R     Signed and Held   Signed and Held  Basic metabolic panel  Tomorrow morning,   R      Signed and Held          Vitals/Pain Today's Vitals   10/08/18 1230 10/08/18 1300 10/08/18 1330 10/08/18 1339  BP: (!) 107/57 (!) 103/57 113/68   Pulse: 90 85 87   Resp: (!) 30 (!) 23 (!) 29   Temp:    98.3 F (36.8 C)  TempSrc:      SpO2: 94% 94% 94%   Weight:      Height:      PainSc:        Isolation Precautions Droplet precaution  Medications Medications  sodium chloride flush (NS) 0.9 % injection 3 mL (3 mLs Intravenous Not Given 10/08/18 1051)  sodium chloride 0.9 % bolus 500 mL (500 mLs Intravenous Transfusing/Transfer 10/08/18 1346)  ipratropium-albuterol (DUONEB) 0.5-2.5 (3) MG/3ML nebulizer solution 3 mL (3 mLs Nebulization Given 10/08/18 1042)  ipratropium-albuterol (DUONEB) 0.5-2.5 (3) MG/3ML nebulizer solution 3 mL (3 mLs Nebulization Given 10/08/18 1042)  methylPREDNISolone sodium succinate (SOLU-MEDROL) 125 mg/2 mL injection 125 mg (125 mg Intravenous Given 10/08/18 1042)  acetaminophen (TYLENOL) tablet 1,000 mg (1,000 mg Oral Given 10/08/18 1042)    Mobility walks Low fall risk   Focused Assessments Pulmonary  Assessment Handoff:  Lung sounds: L Breath Sounds: Diminished R Breath Sounds: Diminished O2 Device: Nasal Cannula O2 Flow Rate (L/min): 2 L/min      R Recommendations: See Admitting Provider Note  Report given to:   Additional Notes:

## 2018-10-08 NOTE — Progress Notes (Signed)
   10/08/18 1815  Clinical Encounter Type  Visited With Patient and family together;Health care provider  Visit Type Follow-up  Recommendations follow up for AD Sunday, 2/16   Followed up on OR regarding prayer and AD.  Patient and spouse only expressed interest in prayer.  Chaplain facilitated conversation regarding what they would like included in prayer before leading it.  In conversation with patient nurse after visit, nurse indicated that they were interested in AD and went into room to clarify with patient and spouse.  Nurse returned to chaplain and reported that they had thought earlier discussion met their needs and would like a chaplain to review AD with them on Sunday, 2/16.

## 2018-10-08 NOTE — ED Notes (Signed)
Pts WOB has improved, currently unlabored, sitting on side of bed

## 2018-10-09 LAB — BASIC METABOLIC PANEL
ANION GAP: 5 (ref 5–15)
BUN: 21 mg/dL (ref 8–23)
CO2: 34 mmol/L — ABNORMAL HIGH (ref 22–32)
Calcium: 8.2 mg/dL — ABNORMAL LOW (ref 8.9–10.3)
Chloride: 89 mmol/L — ABNORMAL LOW (ref 98–111)
Creatinine, Ser: 1.05 mg/dL (ref 0.61–1.24)
GFR calc Af Amer: >60 mL/min (ref >60)
GFR calc non Af Amer: >60 mL/min (ref >60)
Glucose, Bld: 137 mg/dL — ABNORMAL HIGH (ref 70–99)
Potassium: 4.5 mmol/L (ref 3.5–5.1)
Sodium: 128 mmol/L — ABNORMAL LOW (ref 135–145)

## 2018-10-09 LAB — TROPONIN I: TROPONIN I: 0.05 ng/mL — AB (ref <0.03)

## 2018-10-09 MED ORDER — IPRATROPIUM-ALBUTEROL 0.5-2.5 (3) MG/3ML IN SOLN
3.0000 mL | Freq: Two times a day (BID) | RESPIRATORY_TRACT | Status: DC
  Administered 2018-10-09 – 2018-10-14 (×11): 3 mL via RESPIRATORY_TRACT
  Filled 2018-10-09 (×10): qty 3

## 2018-10-09 MED ORDER — MENTHOL 3 MG MT LOZG
1.0000 | LOZENGE | OROMUCOSAL | Status: DC | PRN
  Filled 2018-10-09: qty 9

## 2018-10-09 MED ORDER — FUROSEMIDE 10 MG/ML IJ SOLN: 40.0000 mg | Freq: Every day | INTRAMUSCULAR | Status: DC

## 2018-10-09 MED ORDER — FUROSEMIDE 10 MG/ML IJ SOLN
20.0000 mg | Freq: Every day | INTRAMUSCULAR | Status: DC
  Administered 2018-10-10: 20 mg via INTRAVENOUS
  Filled 2018-10-09: qty 2

## 2018-10-09 NOTE — Progress Notes (Addendum)
Pt sitting in chair. BP low parameters/asymptomatic. Denies having any symptoms. Recheck in other arm. Pt returned to bed with po's given. Reviewed meds. Family and chaplain at bedside. No acute distress. Dr. Posey Pronto notified and advised to continue to monitor pt; no new orders at this time since he is asymptomatic. Pt and family advised on safety/fall precautions- do not get up without assistance with repeat back.

## 2018-10-09 NOTE — Progress Notes (Signed)
Transported in bed with 02 on to RM 235 with myself and transporter with pt tolerating transfer well.

## 2018-10-09 NOTE — Progress Notes (Signed)
Progress Note  Patient Name: Kenneth Larsen Date of Encounter: 10/09/2018  Primary Cardiologist: New to [facility]-  Subjective   Reports breathing has improved, still on nasal cannula oxygen Not at baseline, still with significant cough, wheezing congestion Has been receiving Lasix twice daily Good urine output per the patient, not appear to be recorded Blood pressure running low, slight climbing BUN and creatinine  Sodium 126 up to 128  Inpatient Medications    Scheduled Meds: . aspirin EC  81 mg Oral Daily  . budesonide (PULMICORT) nebulizer solution  0.5 mg Nebulization BID  . carvedilol  3.125 mg Oral BID WC  . cholecalciferol  1,000 Units Oral Daily  . docusate sodium  100 mg Oral BID  . enoxaparin (LOVENOX) injection  40 mg Subcutaneous Q24H  . [START ON 10/10/2018] furosemide  20 mg Intravenous Daily  . guaiFENesin  600 mg Oral BID  . ipratropium-albuterol  3 mL Nebulization BID  . levothyroxine  50 mcg Oral Daily  . mouth rinse  15 mL Mouth Rinse BID  . oseltamivir  75 mg Oral BID  . sodium chloride flush  3 mL Intravenous Once  . sodium chloride flush  3 mL Intravenous Q12H  . vitamin B-12  1,000 mcg Oral Daily   Continuous Infusions: . sodium chloride     PRN Meds: sodium chloride, acetaminophen **OR** acetaminophen, ipratropium-albuterol, menthol-cetylpyridinium, ondansetron **OR** ondansetron (ZOFRAN) IV, polyethylene glycol, sodium chloride flush, traZODone   Vital Signs    Vitals:   10/09/18 1510 10/09/18 1514 10/09/18 1517 10/09/18 1538  BP: (!) 72/38 (!) 72/39 (!) 79/54 (!) 85/47  Pulse: 69 74 72 73  Resp:  20 20 20   Temp: 97.9 F (36.6 C)     TempSrc: Oral     SpO2: 94% 93% 93% 95%  Weight:      Height:        Intake/Output Summary (Last 24 hours) at 10/09/2018 1620 Last data filed at 10/08/2018 1706 Gross per 24 hour  Intake -  Output 350 ml  Net -350 ml   Last 3 Weights 10/09/2018 10/08/2018  Weight (lbs) 235 lb 6.4 oz 240 lb  Weight  (kg) 106.777 kg 108.863 kg      Telemetry    NSR - Personally Reviewed  ECG     - Personally Reviewed  Physical Exam   Constitutional:  oriented to person, place, and time. No distress.  On nasal cannula oxygen HENT:  Head: Grossly normal Eyes:  no discharge. No scleral icterus.  Neck: No JVD, no carotid bruits  Cardiovascular: Regular rate and rhythm, 0-2/5 systolic ejection murmur right sternal border Pulmonary/Chest: Scattered Rales, wheezes, improved compared to yesterday Abdominal: Soft.  no distension.  no tenderness.  Musculoskeletal: Normal range of motion Neurological:  normal muscle tone. Coordination normal. No atrophy Skin: Skin warm and dry Psychiatric: normal affect, pleasant   Labs    Chemistry Recent Labs  Lab 10/08/18 1020 10/09/18 0217  NA 126* 128*  K 3.7 4.5  CL 84* 89*  CO2 35* 34*  GLUCOSE 91 137*  BUN 16 21  CREATININE 0.95 1.05  CALCIUM 8.5* 8.2*  PROT 6.9  --   ALBUMIN 3.9  --   AST 20  --   ALT 12  --   ALKPHOS 50  --   BILITOT 1.4*  --   GFRNONAA >60 >60  GFRAA >60 >60  ANIONGAP 7 5     Hematology Recent Labs  Lab 10/08/18 1020  WBC  5.7  RBC 5.35  HGB 15.8  HCT 48.8  MCV 91.2  MCH 29.5  MCHC 32.4  RDW 12.6  PLT 243    Cardiac Enzymes Recent Labs  Lab 10/08/18 1020 10/08/18 1445 10/08/18 2052 10/09/18 0217  TROPONINI 0.05* 0.06* 0.05* 0.05*   No results for input(s): TROPIPOC in the last 168 hours.   BNP Recent Labs  Lab 10/08/18 1020  BNP 823.0*     DDimer No results for input(s): DDIMER in the last 168 hours.   Radiology    Dg Chest Port 1 View  Result Date: 10/08/2018 CLINICAL DATA:  Shortness of breath and hypoxia. EXAM: PORTABLE CHEST 1 VIEW COMPARISON:  07/23/2016 FINDINGS: Stable and normal heart size. Stable mild tortuosity and calcified plaque in the thoracic aorta. Lungs show evidence pulmonary venous hypertension and dilated central pulmonary vessels. No overt airspace edema or visualized  pleural fluid. Healed posterior right rib fractures. 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 Electronically Signed   By: Aletta Edouard M.D.   On: 10/08/2018 10:49    Cardiac Studies   Echocardiogram pending  Patient Profile     Kenneth Larsen is a 73 y.o. male with a hx of smoking for more than 25 years quit age 89, hypertension, chronic leg swelling, hypothyroidism,  aortic valve disease, presenting with worsening shortness of breath, leg swelling  Assessment & Plan    1. Acute respiratory distress Likely multifactorial including aortic valve disease, influenza A, mild COPD, Unable to exclude CHF,  echo pending.  Discussed with technician, they will do this next Presented with leg swelling, this has improved moderately -BNP is elevated on arrival but in the setting of influenza and COPD Blood pressure running low, will decrease his Lasix dosing Continue Tamiflu, nebulizers  2) aortic valve disease Clinical exam concerning for aortic valve stenosis,  possibly severe given worsening shortness of breath symptoms past several months Pathology discussed with patient and family yesterday If severe we did discuss surgery versus TAVR --Echo pending  3) hypertension Improved with diuresis, now running low ACE inhibitor held, his home medications held He was started on carvedilol low-dose.  Hold parameters placed  4) influenza A Supportive, Tamiflu, nebulizers given reactive airway disease  5) hypothyroidism Continue outpatient regiment  6) abnormal EKG/elevated troponin, Demand ischemia Echocardiogram pending, likely LVH with repolarization abnormality For severe aortic valve disease would likely need outpatient catheterization in preparation for intervention  7) hyponatremia Reports having high fluid intake Mountain Dew and ice tea Would recommend free water restriction,  discussed with him today Slight increase in sodium at 128.  Denies any symptoms   Total encounter time more than 25 minutes  Greater than 50% was spent in counseling and coordination of care with the patient   For questions or updates, please contact [place] Please consult [email or web address] for contact info under        Signed, Ida Rogue, MD  10/09/2018, 4:20 PM

## 2018-10-09 NOTE — Progress Notes (Addendum)
TC report given to Lattie Haw RN with bed 253 assignment with pt accepted. Transport called to assist with transport. Pt and family updated.

## 2018-10-09 NOTE — Progress Notes (Signed)
[facility] [facility] [place] NAME: Kenneth Larsen    MR#:  [id number]  DATE OF BIRTH:  [birth year]  SUBJECTIVE:  patient came in with increasing shortness of breath a few days along with leg swelling. Wife and daughter in the room. Patient has some horse nurse of voice and sore throat. He was tested positive for flu. No fever. feelsbetter than yesterday.  REVIEW OF SYSTEMS:   Review of Systems  Constitutional: Negative for chills, fever and weight loss.  HENT: Positive for sore throat. Negative for ear discharge, ear pain and nosebleeds.   Eyes: Negative for blurred vision, pain and discharge.  Respiratory: Positive for cough, sputum production and shortness of breath. Negative for wheezing.   Cardiovascular: Positive for leg swelling and PND. Negative for chest pain, palpitations and orthopnea.  Gastrointestinal: Negative for abdominal pain, diarrhea, nausea and vomiting.  Genitourinary: Negative for frequency and urgency.  Musculoskeletal: Negative for back pain and joint pain.  Neurological: Positive for weakness. Negative for sensory change, speech change and focal weakness.  Psychiatric/Behavioral: Negative for depression and hallucinations. The patient is not nervous/anxious.    Tolerating Diet:yes Tolerating PT:   DRUG ALLERGIES:   Allergies  Allergen Reactions  . Penicillins Hives    VITALS:  Blood pressure 97/70, pulse 74, temperature 97.8 F (36.6 C), temperature source Oral, resp. rate 20, height 6\' 1"  (1.854 m), weight 106.8 kg, SpO2 94 %.  PHYSICAL EXAMINATION:   Physical Exam  GENERAL:  73 y.o.-year-old patient lying in the bed with no acute distress. Obese EYES: Pupils equal, round, reactive to light and accommodation. No scleral icterus. Extraocular muscles intact.  HEENT: Head atraumatic, normocephalic. Oropharynx and nasopharynx clear.  NECK:  Supple, no jugular venous distention. No thyroid enlargement, no  tenderness.  LUNGS decreased  breath sounds bilaterally, no wheezing, rales, rhonchi. No use of accessory muscles of respiration.  CARDIOVASCULAR: S1, S2 normal. No murmurs, rubs, or gallops.  ABDOMEN: Soft, nontender, nondistended. Bowel sounds present. No organomegaly or mass.  EXTREMITIES: ++ edema b/l.    NEUROLOGIC: Cranial nerves II through XII are intact. No focal Motor or sensory deficits b/l.   PSYCHIATRIC:  patient is alert and oriented x 3.  SKIN: No obvious rash, lesion, or ulcer.   LABORATORY PANEL:  CBC Recent Labs  Lab 10/08/18 1020  WBC 5.7  HGB 15.8  HCT 48.8  PLT 243    Chemistries  Recent Labs  Lab 10/08/18 1020 10/09/18 0217  NA 126* 128*  K 3.7 4.5  CL 84* 89*  CO2 35* 34*  GLUCOSE 91 137*  BUN 16 21  CREATININE 0.95 1.05  CALCIUM 8.5* 8.2*  AST 20  --   ALT 12  --   ALKPHOS 50  --   BILITOT 1.4*  --    Cardiac Enzymes Recent Labs  Lab 10/09/18 0217  TROPONINI 0.05*   RADIOLOGY:  Dg Chest Port 1 View  Result Date: 10/08/2018 CLINICAL DATA:  Shortness of breath and hypoxia. EXAM: PORTABLE CHEST 1 VIEW COMPARISON:  07/23/2016 FINDINGS: Stable and normal heart size. Stable mild tortuosity and calcified plaque in the thoracic aorta. Lungs show evidence pulmonary venous hypertension and dilated central pulmonary vessels. No overt airspace edema or visualized pleural fluid. Healed posterior right rib fractures. 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 Electronically Signed   By: Aletta Edouard M.D.   On: 10/08/2018 10:49   ASSESSMENT  AND PLAN:  Kenneth Larsen  is a 73 y.o. male with a known history per below presenting to the emergency room with 2 to 3-day history of worsening shortness of breath, worsening leg swelling, was seen in neuro clinic earlier today found to be hypoxic with O2 saturation in the 80s, placed on oxygen via nasal cannula to the  emergency room.  *Acute hypoxic respiratory failure -Suspect due to multifactorial process that includes acute influenza A infection and probable acute new onset congestive heart failure exacerbation -supplemental oxygen wean as tolerated  *Acute influenza A infection Tamiflu twice daily, supportive care  *Acute probable new onset congestive heart failure exacerbation -?AS -Congestive heart failure protocol -strict I&O monitoring, daily weights - IV Lasix twice daily -cardiology consultation from Dr. Rockey Situ appreciated. -check echocardiogram, low-dose Coreg/lisinopril if blood pressure will tolerate -aspirin  * elevated troponins Most likely secondary to demand ischemia from above disease process states Continue to cycle cardiac enzymes, follow-up on echocardiogram  *Chronic benign essential hypertension Currently with low normal blood pressures, hold antihypertensives, vitals per routine, make changes as per necessary  *Chronic obesity Most likely secondary to excess calories Lifestyle modification recommended  Case discussed with Care Management/Social Worker. Management plans discussed with the patient, family and they are in agreement.  CODE STATUS: partial  DVT Prophylaxis: Lovenox  TOTAL TIME TAKING CARE OF THIS PATIENT: *30** minutes.  >50% time spent on counselling and coordination of care  POSSIBLE D/C IN 2 to 3 DAYS, DEPENDING ON CLINICAL CONDITION.  Note: This dictation was prepared with Dragon dictation along with smaller phrase technology. Any transcriptional errors that result from this process are unintentional.  [name] M.D on 10/09/2018 at 1:58 PM  Between 7am to 6pm - Pager - [phone]  After 6pm go to [email or web address] - password [email or web address] [facility]  Office  [phone]  CC: Primary care physician; [facility] HealthPatient ID: Kenneth Larsen, male   DOB: 09-Feb-1946, 73 y.o.   MRN: [id number]

## 2018-10-09 NOTE — Plan of Care (Signed)
  Problem: Spiritual Needs Goal: Ability to function at adequate level Outcome: Progressing   Problem: Education: Goal: Knowledge of General Education information will improve Description Including pain rating scale, medication(s)/side effects and non-pharmacologic comfort measures Outcome: Progressing   Problem: Health Behavior/Discharge Planning: Goal: Ability to manage health-related needs will improve Outcome: Progressing   Problem: Clinical Measurements: Goal: Ability to maintain clinical measurements within normal limits will improve Outcome: Progressing Goal: Diagnostic test results will improve Outcome: Progressing Goal: Respiratory complications will improve Outcome: Progressing Goal: Cardiovascular complication will be avoided Outcome: Progressing   Problem: Activity: Goal: Risk for activity intolerance will decrease Outcome: Progressing   Problem: Safety: Goal: Ability to remain free from injury will improve Outcome: Progressing

## 2018-10-09 NOTE — Progress Notes (Signed)
Received report from Westley Gambles, RN regarding this patient, Kenneth Larsen.  Advised patient's blood pressure is low with systolic in 22'E and 49'P.  Patient is asymptomatic and Dr. Posey Pronto has advised [place] to just monitor patient.

## 2018-10-09 NOTE — Progress Notes (Signed)
Reports feeling better today. Loose wheezing throughout with clear thick sputum produced with occasional cough. Up in chair x 2 hours and tolerated well. Noted decrease in edema in LE's. Wife at bedside. 02 continued with pt DOE. Awaiting ECHO. New order to transfer pt to Telemetry 2A. Family and pt updated regarding transfer order.

## 2018-10-09 NOTE — Progress Notes (Signed)
Pastoral Care Visit for [place]    10/09/18 1520  Clinical Encounter Type  Visited With Patient and family together (spouse and son present)  Visit Type Follow-up;Other (Comment) (HCPOA)  Referral From Nurse  Consult/Referral To Oren Bracket met w/ pt and family to educate about HCPOA.  Explained and left ppwk with pt.  Pt will notify nurse when ready to have a chap come back to complete ppwk with notary. Pt and spouse wish to complete  Darcey Nora, Chaplain

## 2018-10-10 ENCOUNTER — Inpatient Hospital Stay (HOSPITAL_COMMUNITY)
Admit: 2018-10-10 | Discharge: 2018-10-10 | Disposition: A | Discharge status: Home / Self Care | Payer: Medicare Other | Attending: Cardiovascular Disease | Admitting: Cardiovascular Disease

## 2018-10-10 DIAGNOSIS — I5031 Acute diastolic (congestive) heart failure: Secondary | ICD-10-CM

## 2018-10-10 DIAGNOSIS — I350 Nonrheumatic aortic (valve) stenosis: Secondary | ICD-10-CM

## 2018-10-10 DIAGNOSIS — R9431 Abnormal electrocardiogram [ECG] [EKG]: Secondary | ICD-10-CM

## 2018-10-10 LAB — ECHOCARDIOGRAM COMPLETE
Height: 73 in
Weight: 3820.8 oz

## 2018-10-10 MED ORDER — FUROSEMIDE 10 MG/ML IJ SOLN
20.0000 mg | Freq: Two times a day (BID) | INTRAMUSCULAR | Status: DC
  Administered 2018-10-10 – 2018-10-11 (×2): 20 mg via INTRAVENOUS
  Filled 2018-10-10 (×2): qty 2

## 2018-10-10 MED ORDER — PERFLUTREN LIPID MICROSPHERE
1.0000 mL | INTRAVENOUS | Status: AC | PRN
  Administered 2018-10-10: 3 mL via INTRAVENOUS
  Filled 2018-10-10: qty 10

## 2018-10-10 NOTE — Plan of Care (Signed)
Nutrition Education Note  RD consulted for nutrition education regarding new onset CHF.  RD provided "Low Sodium Nutrition Therapy" handout from the Academy of Nutrition and Dietetics. Reviewed patient's dietary recall. Provided examples on ways to decrease sodium intake in diet. Discouraged intake of processed foods and use of salt shaker. Encouraged fresh fruits and vegetables as well as whole grain sources of carbohydrates to maximize fiber intake.   RD discussed why it is important for patient to adhere to diet recommendations, and emphasized the role of fluids, foods to avoid, and importance of weighing self daily. Teach back method used.  Expect good compliance.  Body mass index is 31.51 kg/m. Pt meets criteria for obese based on current BMI.  Current diet order is HH, patient is consuming approximately 85% of meals at this time. Labs and medications reviewed. No further nutrition interventions warranted at this time. RD contact information provided. If additional nutrition issues arise, please re-consult RD.   Kenneth Larsen, RD, LDN  After Hours/Weekend Pager: [phone]

## 2018-10-10 NOTE — Progress Notes (Addendum)
Ch received an OR regarding finalizing an AD for pt. Pt was responsive and had mental capacity to sign in the presence of witness. Pt was having a ECO done when ch arrived but ch assured pt that they could have the AD completed in a timely manner. Ch was able to gather 2 witnesses from volunteer services and a notary. Ch was mindful to not utilize witnesses that were visitors b/c entry into pt room required wearing a face mask and clean hands. Both pt and wife had a AD completed by notary after ch checked over the document with the notary. Ch ensured pt that a copy was placed in his chart and handed a copy and the original AD to the pt's wife. No further needs at this time.    10/10/18 1000  Clinical Encounter Type  Visited With Patient and family together;Health care provider  Visit Type Social support;Spiritual support;Psychological support (AD completion )  Referral From Chaplain  Consult/Referral To Chaplain  Spiritual Encounters  Spiritual Needs Emotional;Grief support  Stress Factors  Patient Stress Factors Exhausted;Major life changes  Family Stress Factors Exhausted;Major life changes  Advance Directives (For Healthcare)  Does Patient Have a Medical Advance Directive? Yes  Does patient want to make changes to medical advance directive? No - Patient declined  Type of Paramedic of [place];Living will  Copy of [place] in Chart? Yes - validated most recent copy scanned in chart (See row information)  Copy of Living Will in Chart? Yes - validated most recent copy scanned in chart (See row information)  Would patient like information on creating a medical advance directive? No - Patient declined

## 2018-10-10 NOTE — Plan of Care (Signed)
  Problem: Activity: Goal: Risk for activity intolerance will decrease Outcome: Progressing   

## 2018-10-10 NOTE — Progress Notes (Signed)
*  PRELIMINARY RESULTS* Echocardiogram 2D Echocardiogram has been performed.  Wallie Char Arrie Zuercher 10/10/2018, 10:21 AM

## 2018-10-10 NOTE — Progress Notes (Signed)
Progress Note  Patient Name: Kenneth Larsen Date of Encounter: 10/10/2018  Primary Cardiologist: New to [facility]  Subjective   He reports continued shortness of breath and orthopnea although he has improved. He reports progressive symptoms of worsening exertional dyspnea over the last year.  He has been told about a heart murmur in the past but no recent echocardiogram evaluation. No chest pain.   Inpatient Medications    Scheduled Meds: . aspirin EC  81 mg Oral Daily  . budesonide (PULMICORT) nebulizer solution  0.5 mg Nebulization BID  . cholecalciferol  1,000 Units Oral Daily  . docusate sodium  100 mg Oral BID  . enoxaparin (LOVENOX) injection  40 mg Subcutaneous Q24H  . furosemide  20 mg Intravenous BID  . guaiFENesin  600 mg Oral BID  . ipratropium-albuterol  3 mL Nebulization BID  . levothyroxine  50 mcg Oral Daily  . mouth rinse  15 mL Mouth Rinse BID  . oseltamivir  75 mg Oral BID  . sodium chloride flush  3 mL Intravenous Once  . sodium chloride flush  3 mL Intravenous Q12H  . vitamin B-12  1,000 mcg Oral Daily   Continuous Infusions: . sodium chloride     PRN Meds: sodium chloride, acetaminophen **OR** acetaminophen, ipratropium-albuterol, menthol-cetylpyridinium, ondansetron **OR** ondansetron (ZOFRAN) IV, polyethylene glycol, sodium chloride flush, traZODone   Vital Signs    Vitals:   10/09/18 2031 10/10/18 0506 10/10/18 0807 10/10/18 0907  BP:  (!) 95/51  (!) 99/45  Pulse:  65  75  Resp:  18  18  Temp:  98.8 F (37.1 C)  100 F (37.8 C)  TempSrc:  Oral  Oral  SpO2: 92% 95% 93% 94%  Weight:  108.3 kg    Height:        Intake/Output Summary (Last 24 hours) at 10/10/2018 1903 Last data filed at 10/10/2018 1030 Gross per 24 hour  Intake 6 ml  Output 625 ml  Net -619 ml   Last 3 Weights 10/10/2018 10/09/2018 10/08/2018  Weight (lbs) 238 lb 12.8 oz 235 lb 6.4 oz 240 lb  Weight (kg) 108.319 kg 106.777 kg 108.863 kg      Telemetry    NSR -  Personally Reviewed  ECG     - Personally Reviewed  Physical Exam   Constitutional:  oriented to person, place, and time. No distress.  On nasal cannula oxygen HENT:  Head: Grossly normal Eyes:  no discharge. No scleral icterus.  Neck: Mild JVD, no carotid bruits  Cardiovascular: Regular rate and rhythm, 3/6 systolic ejection murmur right sternal border which is late peaking with absent S2. Pulmonary/Chest: Bibasilar crackles are still here. Abdominal: Soft.  no distension.  no tenderness.  Musculoskeletal: Normal range of motion Neurological:  normal muscle tone. Coordination normal. No atrophy Skin: Skin warm and dry Psychiatric: normal affect, pleasant   Labs    Chemistry Recent Labs  Lab 10/08/18 1020 10/09/18 0217  NA 126* 128*  K 3.7 4.5  CL 84* 89*  CO2 35* 34*  GLUCOSE 91 137*  BUN 16 21  CREATININE 0.95 1.05  CALCIUM 8.5* 8.2*  PROT 6.9  --   ALBUMIN 3.9  --   AST 20  --   ALT 12  --   ALKPHOS 50  --   BILITOT 1.4*  --   GFRNONAA >60 >60  GFRAA >60 >60  ANIONGAP 7 5     Hematology Recent Labs  Lab 10/08/18 1020  WBC 5.7  RBC 5.35  HGB 15.8  HCT 48.8  MCV 91.2  MCH 29.5  MCHC 32.4  RDW 12.6  PLT 243    Cardiac Enzymes Recent Labs  Lab 10/08/18 1020 10/08/18 1445 10/08/18 2052 10/09/18 0217  TROPONINI 0.05* 0.06* 0.05* 0.05*   No results for input(s): TROPIPOC in the last 168 hours.   BNP Recent Labs  Lab 10/08/18 1020  BNP 823.0*     DDimer No results for input(s): DDIMER in the last 168 hours.   Radiology    No results found.  Cardiac Studies   Echocardiogram was done today and personally reviewed by me:    1. The left ventricle has low normal systolic function, with an ejection fraction of 50-55%. The cavity size was mildly dilated. Left ventricular diastology could not be evaluated. Not able to evaluate wall motion.  2. The right ventricle has normal systolic function. The cavity was normal. There is no increase  in right ventricular wall thickness.  3. Left atrial size was mildly dilated.  4. The mitral valve is normal in structure. There is moderate mitral annular calcification present.  5. The tricuspid valve is normal in structure.  6. The aortic valve is tricuspid Severe calcifcation of the aortic valve. severe stenosis of the aortic valve.  7. The pulmonic valve was normal in structure.  8. Difficult study overall. Definity was used.    Patient Profile     RAYMUNDO ROUT is a 73 y.o. male with a hx of smoking for more than 25 years quit age 89, hypertension, chronic leg swelling, hypothyroidism,  aortic valve disease, presenting with worsening shortness of breath, leg swelling  Assessment & Plan    1. Acute respiratory distress Likely multifactorial including aortic valve disease, influenza A, mild COPD,  2.  Acute diastolic heart failure: I suspect that this is likely due to aortic valve disease.  He continues to be volume overloaded and I increased IV furosemide to twice daily. Given that his blood pressure has been on the low side, I discontinued carvedilol.  Do not start ACE inhibitor or ARB.  3.  Severe aortic stenosis: I suspect that this is the likely culprit for the patient's heart failure. The patient and his family had a lot of questions about management discussed.  It appears that he has been having progressive symptoms over the last year. I recommend evaluation for TAVR preferably once he recovers from influenza. Potentially, a right and left cardiac catheterization can be performed during this hospitalization once he is not orthopneic.  The patient and his family prefer this route.  4.  Hypotension: Seems to be mild.  I discontinued carvedilol.  Cannot start any antihypertensive medications. This could be due to severe aortic stenosis.  5. influenza A Supportive, Tamiflu, nebulizers given reactive airway disease  6.  hyponatremia: Likely due to congestive heart  failure. Would recommend free water restriction.  Avoid any sodium supplementation given his heart failure.  The patient and his family had a lot of questions about the patient's condition.  His son is a Marine scientist.   Total encounter time more than 35 minutes  Greater than 50% was spent in counseling and coordination of care with the patient   For questions or updates, please contact [place] Please consult [email or web address] for contact info under        Signed, Kathlyn Sacramento, MD  10/10/2018, 7:03 PM

## 2018-10-10 NOTE — Progress Notes (Signed)
Kenneth Larsen at [place] NAME: Kenneth Larsen    MR#:  [id number]  DATE OF BIRTH:  05/14/1946  SUBJECTIVE:  patient is out sitting in the chair. Wife in the room. Feels a lot better. Good urine output over 24 hours.  REVIEW OF SYSTEMS:   Review of Systems  Constitutional: Negative for chills, fever and weight loss.  HENT: Positive for sore throat. Negative for ear discharge, ear pain and nosebleeds.   Eyes: Negative for blurred vision, pain and discharge.  Respiratory: Positive for cough, sputum production and shortness of breath. Negative for wheezing.   Cardiovascular: Positive for leg swelling and PND. Negative for chest pain, palpitations and orthopnea.  Gastrointestinal: Negative for abdominal pain, diarrhea, nausea and vomiting.  Genitourinary: Negative for frequency and urgency.  Musculoskeletal: Negative for back pain and joint pain.  Neurological: Positive for weakness. Negative for sensory change, speech change and focal weakness.  Psychiatric/Behavioral: Negative for depression and hallucinations. The patient is not nervous/anxious.    Tolerating Diet:yes Tolerating PT: ambulatory  DRUG ALLERGIES:   Allergies  Allergen Reactions  . Penicillins Hives    VITALS:  Blood pressure (!) 99/45, pulse 75, temperature 100 F (37.8 C), temperature source Oral, resp. rate 18, height 6\' 1"  (1.854 m), weight 108.3 kg, SpO2 94 %.  PHYSICAL EXAMINATION:   Physical Exam  GENERAL:  73 y.o.-year-old patient lying in the bed with no acute distress. Obese EYES: Pupils equal, round, reactive to light and accommodation. No scleral icterus. Extraocular muscles intact.  HEENT: Head atraumatic, normocephalic. Oropharynx and nasopharynx clear.  NECK:  Supple, no jugular venous distention. No thyroid enlargement, no tenderness.  LUNGS decreased  breath sounds bilaterally, no wheezing, rales, rhonchi. No use of accessory muscles of respiration.   CARDIOVASCULAR: S1, S2 normal. Systolic harsh murmurs, rubs, or gallops.  ABDOMEN: Soft, nontender, nondistended. Bowel sounds present. No organomegaly or mass.  EXTREMITIES: ++ edema b/l.    NEUROLOGIC: Cranial nerves II through XII are intact. No focal Motor or sensory deficits b/l.   PSYCHIATRIC:  patient is alert and oriented x 3.  SKIN: No obvious rash, lesion, or ulcer.   LABORATORY PANEL:  CBC Recent Labs  Lab 10/08/18 1020  WBC 5.7  HGB 15.8  HCT 48.8  PLT 243    Chemistries  Recent Labs  Lab 10/08/18 1020 10/09/18 0217  NA 126* 128*  K 3.7 4.5  CL 84* 89*  CO2 35* 34*  GLUCOSE 91 137*  BUN 16 21  CREATININE 0.95 1.05  CALCIUM 8.5* 8.2*  AST 20  --   ALT 12  --   ALKPHOS 50  --   BILITOT 1.4*  --    Cardiac Enzymes Recent Labs  Lab 10/09/18 0217  TROPONINI 0.05*   RADIOLOGY:  No results found. ASSESSMENT AND PLAN:  Kenneth Larsen  is a 73 y.o. male with a known history per below presenting to the emergency room with 2 to 3-day history of worsening shortness of breath, worsening leg swelling, was seen in neuro clinic earlier today found to be hypoxic with O2 saturation in the 80s, placed on oxygen via nasal cannula to the emergency room.  *Acute hypoxic respiratory failure -Suspect due to multifactorial process that includes acute influenza A infection and probable acute new onset congestive heart failure exacerbation -supplemental oxygen wean as tolerated  *Acute influenza A infection Tamiflu twice daily, supportive care  *Acute probable new onset congestive heart failure exacerbation -?AS -Congestive heart  failure protocol -strict I&O monitoring, daily weights - IV Lasix twice daily -cardiology consultation from Dr. Rockey Situ appreciated. -check echocardiogram, low-dose Coreg/lisinopril if blood pressure will tolerate -aspirin  * elevated troponins Most likely secondary to demand ischemia from above disease process states Continue to cycle  cardiac enzymes, follow-up on echocardiogram  *Chronic benign essential hypertension Currently with low normal blood pressures, hold antihypertensives, vitals per routine, make changes as per necessary  Case discussed with Care Management/Social Worker. Management plans discussed with the patient, family and they are in agreement.  CODE STATUS: partial  DVT Prophylaxis: Lovenox  TOTAL TIME TAKING CARE OF THIS PATIENT: *30** minutes.  >50% time spent on counselling and coordination of care  POSSIBLE D/C IN 2 to 3 DAYS, DEPENDING ON CLINICAL CONDITION.  Note: This dictation was prepared with Dragon dictation along with smaller phrase technology. Any transcriptional errors that result from this process are unintentional.  Kenneth Larsen M.D on 10/10/2018 at 4:10 PM  Between 7am to 6pm - Pager - [phone]  After 6pm go to [email or web address] - password [email or web address] [place] [facility]  Office  [phone]  CC: Primary care physician; [facility] HealthPatient ID: Kenneth Larsen, male   DOB: August 12, 1946, 73 y.o.   MRN: [id number]

## 2018-10-11 ENCOUNTER — Encounter: Payer: Self-pay | Admitting: *Deleted

## 2018-10-11 DIAGNOSIS — J9601 Acute respiratory failure with hypoxia: Secondary | ICD-10-CM

## 2018-10-11 DIAGNOSIS — I5033 Acute on chronic diastolic (congestive) heart failure: Secondary | ICD-10-CM

## 2018-10-11 LAB — BASIC METABOLIC PANEL
Anion gap: 5 (ref 5–15)
BUN: 21 mg/dL (ref 8–23)
CALCIUM: 8 mg/dL — AB (ref 8.9–10.3)
CO2: 39 mmol/L — ABNORMAL HIGH (ref 22–32)
Chloride: 86 mmol/L — ABNORMAL LOW (ref 98–111)
Creatinine, Ser: 0.98 mg/dL (ref 0.61–1.24)
GFR calc Af Amer: >60 mL/min (ref >60)
GFR calc non Af Amer: >60 mL/min (ref >60)
Glucose, Bld: 89 mg/dL (ref 70–99)
Potassium: 4.2 mmol/L (ref 3.5–5.1)
SODIUM: 130 mmol/L — AB (ref 135–145)

## 2018-10-11 MED ORDER — MAGNESIUM CITRATE PO SOLN
1.0000 | Freq: Once | ORAL | Status: AC
  Administered 2018-10-11: 1 via ORAL
  Filled 2018-10-11 (×2): qty 296

## 2018-10-11 NOTE — Plan of Care (Signed)
  Problem: Activity: Goal: Risk for activity intolerance will decrease Outcome: Progressing   

## 2018-10-11 NOTE — Progress Notes (Signed)
[name] NAME: Kenneth Larsen    MR#:  [id number]  DATE OF BIRTH:  08-25-1945  SUBJECTIVE:  patient is out sitting in the chair. Feels a lot better. Good urine output over 24 hours.  REVIEW OF SYSTEMS:   Review of Systems  Constitutional: Negative for chills, fever and weight loss.  HENT: Positive for sore throat. Negative for ear discharge, ear pain and nosebleeds.   Eyes: Negative for blurred vision, pain and discharge.  Respiratory: Positive for cough, sputum production and shortness of breath. Negative for wheezing.   Cardiovascular: Positive for leg swelling and PND. Negative for chest pain, palpitations and orthopnea.  Gastrointestinal: Negative for abdominal pain, diarrhea, nausea and vomiting.  Genitourinary: Negative for frequency and urgency.  Musculoskeletal: Negative for back pain and joint pain.  Neurological: Positive for weakness. Negative for sensory change, speech change and focal weakness.  Psychiatric/Behavioral: Negative for depression and hallucinations. The patient is not nervous/anxious.    Tolerating Diet:yes Tolerating PT: ambulatory  DRUG ALLERGIES:   Allergies  Allergen Reactions  . Penicillins Hives    VITALS:  Blood pressure 114/68, pulse 77, temperature 98.2 F (36.8 C), temperature source Oral, resp. rate 18, height 6\' 1"  (1.854 m), weight 106.6 kg, SpO2 93 %.  PHYSICAL EXAMINATION:   Physical Exam  GENERAL:  73 y.o.-year-old patient lying in the bed with no acute distress. Obese EYES: Pupils equal, round, reactive to light and accommodation. No scleral icterus. Extraocular muscles intact.  HEENT: Head atraumatic, normocephalic. Oropharynx and nasopharynx clear.  NECK:  Supple, no jugular venous distention. No thyroid enlargement, no tenderness.  LUNGS decreased  breath sounds bilaterally, no wheezing, rales, rhonchi. No use of accessory muscles of respiration.  CARDIOVASCULAR: S1,  S2 normal. Systolic harsh murmurs, rubs, or gallops.  ABDOMEN: Soft, nontender, nondistended. Bowel sounds present. No organomegaly or mass.  EXTREMITIES: + edema b/l.    NEUROLOGIC: Cranial nerves II through XII are intact. No focal Motor or sensory deficits b/l.   PSYCHIATRIC:  patient is alert and oriented x 3.  SKIN: No obvious rash, lesion, or ulcer.   LABORATORY PANEL:  CBC Recent Labs  Lab 10/08/18 1020  WBC 5.7  HGB 15.8  HCT 48.8  PLT 243    Chemistries  Recent Labs  Lab 10/08/18 1020  10/11/18 0454  NA 126*   < > 130*  K 3.7   < > 4.2  CL 84*   < > 86*  CO2 35*   < > 39*  GLUCOSE 91   < > 89  BUN 16   < > 21  CREATININE 0.95   < > 0.98  CALCIUM 8.5*   < > 8.0*  AST 20  --   --   ALT 12  --   --   ALKPHOS 50  --   --   BILITOT 1.4*  --   --    < > = values in this interval not displayed.   Cardiac Enzymes Recent Labs  Lab 10/09/18 0217  TROPONINI 0.05*   RADIOLOGY:  No results found. ASSESSMENT AND PLAN:  Kenneth Larsen  is a 73 y.o. male with a known history per below presenting to the emergency room with 2 to 3-day history of worsening shortness of breath, worsening leg swelling, was seen in neuro clinic earlier today found to be hypoxic with O2 saturation in the 80s, placed on oxygen via nasal cannula to the emergency room.  *  Acute hypoxic respiratory failure -Suspect due to multifactorial process that includes acute influenza A infection and probable acute new onset congestive heart failure exacerbation and COPD -supplemental oxygen wean as tolerated -IV lasix 20 mg bid per cardiology  *Acute influenza A infection Tamiflu twice daily, supportive care  *known history of aortic stenosis looks severe Per echo. --Congestive heart failure protocol -strict I&O monitoring, daily weights - IV Lasix twice daily -cardiology consultation from Dr. Emelia Loron appreciated. -Plan noted for heart catheterization tomorrow.  *elevated troponins Most likely  secondary to demand ischemia from above disease process states  *Chronic benign essential hypertension Currently with low normal blood pressures, hold antihypertensives, vitals per routine, make changes as per necessary  Case discussed with Care Management/Social Worker. Management plans discussed with the patient, family and they are in agreement.  CODE STATUS: partial  DVT Prophylaxis: Lovenox  TOTAL TIME TAKING CARE OF THIS PATIENT: *30** minutes.  >50% time spent on counselling and coordination of care  POSSIBLE D/C IN fewDAYS, DEPENDING ON CLINICAL CONDITION.  Note: This dictation was prepared with Dragon dictation along with smaller phrase technology. Any transcriptional errors that result from this process are unintentional.  [name] M.D on 10/11/2018 at 2:03 PM  Between 7am to 6pm - Pager - [phone]  After 6pm go to [email or web address] - password [email or web address] [place] [facility]  Office  [phone]  CC: Primary care physician; Center, [facility] HealthPatient ID: Kenneth Larsen, male   DOB: 11-03-1945, 73 y.o.   MRN: [id number]

## 2018-10-11 NOTE — Progress Notes (Signed)
Progress Note  Patient Name: Kenneth Larsen Date of Encounter: 10/11/2018  Primary Cardiologist: New - [facility]  Subjective   Breathing continues to improve, though some chest congestion persists.  Feels like in the orthopnea has gotten better, though he still slept at 60 degrees last night.  He has not tried lying flat.  No chest pain.  Leg swelling almost completely resolved.  Inpatient Medications    Scheduled Meds: . aspirin EC  81 mg Oral Daily  . budesonide (PULMICORT) nebulizer solution  0.5 mg Nebulization BID  . cholecalciferol  1,000 Units Oral Daily  . docusate sodium  100 mg Oral BID  . enoxaparin (LOVENOX) injection  40 mg Subcutaneous Q24H  . furosemide  20 mg Intravenous BID  . guaiFENesin  600 mg Oral BID  . ipratropium-albuterol  3 mL Nebulization BID  . levothyroxine  50 mcg Oral Daily  . mouth rinse  15 mL Mouth Rinse BID  . oseltamivir  75 mg Oral BID  . sodium chloride flush  3 mL Intravenous Once  . sodium chloride flush  3 mL Intravenous Q12H  . vitamin B-12  1,000 mcg Oral Daily   Continuous Infusions: . sodium chloride     PRN Meds: sodium chloride, acetaminophen **OR** acetaminophen, ipratropium-albuterol, menthol-cetylpyridinium, ondansetron **OR** ondansetron (ZOFRAN) IV, polyethylene glycol, sodium chloride flush, traZODone   Vital Signs    Vitals:   10/10/18 2118 10/11/18 0509 10/11/18 0803 10/11/18 0826  BP:  97/60  114/68  Pulse:  78  77  Resp:    18  Temp:  99.3 F (37.4 C)  98.2 F (36.8 C)  TempSrc:  Oral  Oral  SpO2: 94% 91% 93% 93%  Weight:  106.6 kg    Height:        Intake/Output Summary (Last 24 hours) at 10/11/2018 1350 Last data filed at 10/11/2018 1259 Gross per 24 hour  Intake 640 ml  Output 2600 ml  Net -1960 ml   Last 3 Weights 10/11/2018 10/10/2018 10/09/2018  Weight (lbs) 235 lb 1.6 oz 238 lb 12.8 oz 235 lb 6.4 oz  Weight (kg) 106.641 kg 108.319 kg 106.777 kg      Telemetry    Normal sinus rhythm with PACs  and PVCs.- Personally Reviewed  ECG   No new tracing.  Physical Exam   GEN: No acute distress.  Wife at the bedside. Neck: No JVD or HJR.   Cardiac: RRR with 3/6 crescendo-decrescendo systolic murmur loudest at the right upper sternal border. Respiratory:  Diffusely diminished.  No wheezes or crackles. GI: Soft, nontender, non-distended  MS:  Compression stockings in place with trace ankle edema bilaterally; No deformity. Neuro:  Nonfocal  Psych: Normal affect   Labs    Chemistry Recent Labs  Lab 10/08/18 1020 10/09/18 0217 10/11/18 0454  NA 126* 128* 130*  K 3.7 4.5 4.2  CL 84* 89* 86*  CO2 35* 34* 39*  GLUCOSE 91 137* 89  BUN 16 21 21   CREATININE 0.95 1.05 0.98  CALCIUM 8.5* 8.2* 8.0*  PROT 6.9  --   --   ALBUMIN 3.9  --   --   AST 20  --   --   ALT 12  --   --   ALKPHOS 50  --   --   BILITOT 1.4*  --   --   GFRNONAA >60 >60 >60  GFRAA >60 >60 >60  ANIONGAP 7 5 5      Hematology Recent Labs  Lab 10/08/18  1020  WBC 5.7  RBC 5.35  HGB 15.8  HCT 48.8  MCV 91.2  MCH 29.5  MCHC 32.4  RDW 12.6  PLT 243    Cardiac Enzymes Recent Labs  Lab 10/08/18 1020 10/08/18 1445 10/08/18 2052 10/09/18 0217  TROPONINI 0.05* 0.06* 0.05* 0.05*   No results for input(s): TROPIPOC in the last 168 hours.   BNP Recent Labs  Lab 10/08/18 1020  BNP 823.0*     DDimer No results for input(s): DDIMER in the last 168 hours.   Radiology    No results found.  Cardiac Studies   Echo (10/10/2018):  1. The left ventricle has low normal systolic function, with an ejection fraction of 50-55%. The cavity size was mildly dilated. Left ventricular diastology could not be evaluated. Not able to evaluate wall motion. 2. The right ventricle has normal systolic function. The cavity was normal. There is no increase in right ventricular wall thickness. 3. Left atrial size was mildly dilated. 4. The mitral valve is normal in structure. There is moderate mitral annular  calcification present. 5. The tricuspid valve is normal in structure. 6. The aortic valve is tricuspid Severe calcifcation of the aortic valve. severe stenosis of the aortic valve. 7. The pulmonic valve was normal in structure. 8. Difficult study overall. Definity was used.  Patient Profile     Kenneth Swift Wrightis a 73 y.o.malewith a hx of smoking for more than 25 years quit age 73, hypertension, chronic leg swelling, hypothyroidism, aortic valve disease, presenting with worsening shortness of breath, leg swelling.  Assessment & Plan    Acute respiratory distress This seems to be improving and is likely multifactorial including influenza infection, underlying COPD, and a half path with severe aortic stenosis.  Patient is down 5 pounds during this admission and has only trace pedal edema on exam today.  Continue treatment of flu and COPD.  Continue gentle diuresis with furosemide 20 mg IV twice daily (will hold tonight and tomorrow morning's doses in anticipation of possible catheterization).  HFpEF Patient is only mildly volume overloaded at this time by physical exam.  He has lost 5 pounds since admission and is net -2.4 L (intake not completely recorded).  Continue furosemide 20 mg IV twice daily (will hold tonight and tomorrow morning's doses in anticipation of possible catheterization).  Severe aortic stenosis Likely contributing some to symptoms, though this is not an acute problem.  Patient will need further work-up including left and right heart catheterization.  Patient would like to exabyte this is much as possible.  I have asked him to try and lie flat to see if you would tolerate the procedure.  If he does not have any significant orthopnea, we will tentatively plan for left and right heart catheterization tomorrow.  NPO after midnight for possible right and left heart catheterization tomorrow.  Patient will need outpatient follow-up with the valve clinic in [place]  after discharge.  Influenza A Continue oseltamavir and supportive care per internal medicine.  Hyponatremia Improving.  Continue gentle diuresis, as above.  For questions or updates, please contact [place] Please consult [email or web address] for contact info under [facility].   Signed, Nelva Bush, MD  10/11/2018, 1:50 PM

## 2018-10-11 NOTE — Care Management Important Message (Signed)
Important Message  Patient Details  Name: Kenneth Larsen MRN: [id number] Date of Birth: 10-03-1945   Medicare Important Message Given:  Yes    Elza Rafter, RN 10/11/2018, 1:16 PM

## 2018-10-11 NOTE — Progress Notes (Signed)
Pt has not had a BM since 2/15, has received miralax multiple times with no relief.. pt requesting magnesium citrate, MD paged Dr. Jerelyn Charles gave orders to give magnesium citrate x 1. Will give and continue to monitor. Conley Simmonds, RN, BSN

## 2018-10-12 MED ORDER — SODIUM CHLORIDE 0.9% FLUSH: 3.0000 mL | Freq: Two times a day (BID) | INTRAVENOUS | Status: DC

## 2018-10-12 MED ORDER — SODIUM CHLORIDE 0.9 % IV SOLN
INTRAVENOUS | Status: DC
  Administered 2018-10-13: 06:00:00 via INTRAVENOUS

## 2018-10-12 MED ORDER — GUAIFENESIN-DM 100-10 MG/5ML PO SYRP
5.0000 mL | ORAL_SOLUTION | ORAL | Status: DC | PRN
  Administered 2018-10-12 – 2018-10-14 (×4): 5 mL via ORAL
  Filled 2018-10-12 (×6): qty 5

## 2018-10-12 MED ORDER — FUROSEMIDE 40 MG PO TABS
40.0000 mg | ORAL_TABLET | Freq: Once | ORAL | Status: AC
  Administered 2018-10-12: 40 mg via ORAL
  Filled 2018-10-12: qty 1

## 2018-10-12 MED ORDER — SODIUM CHLORIDE 0.9 % IV SOLN: 250.0000 mL | INTRAVENOUS | Status: DC | PRN

## 2018-10-12 MED ORDER — ASPIRIN 81 MG PO CHEW
81.0000 mg | CHEWABLE_TABLET | ORAL | Status: AC
  Administered 2018-10-13: 81 mg via ORAL
  Filled 2018-10-12: qty 1

## 2018-10-12 NOTE — Plan of Care (Signed)
  Problem: Clinical Measurements: Goal: Ability to maintain clinical measurements within normal limits will improve Outcome: Progressing   Problem: Clinical Measurements: Goal: Diagnostic test results will improve Outcome: Not Progressing Note:  BNP 823 on admission, NA 130, Troponins slightly elevated Goal: Respiratory complications will improve Outcome: Not Progressing Note:  Pt positive for flu, 2L oxygen in place, this is acute for pt

## 2018-10-12 NOTE — Care Management Note (Signed)
Case Management Note  Patient Details  Name: RANDALL COLDEN MRN: [id number] Date of Birth: January 27, 1946  Subjective/Objective:     Patient is from home with wife.  He is independent in all ADL's.  Scheduled cath for 2-20.  New CHF.  Patient does not have a scale but can obtain one at [facility].  Prescriptions from [facility] on [address] [place].  Current with PCP.  No equipment needs.  Currently on 2L acute oxygen.  Offered HH services and they would like to wait to make a decision until after Cath tomorrow.  Will speak with patient again tomorrow.                Action/Plan:   Expected Discharge Date:                  Expected Discharge Plan:  Home/Self Care  In-House Referral:     Discharge planning Services  CM Consult  Post Acute Care Choice:    Choice offered to:     DME Arranged:    DME Agency:     HH Arranged:  Patient Refused [place] Agency:     Status of Service:  In process, will continue to follow  If discussed at Long Length of Stay Meetings, dates discussed:    Additional Comments:  Elza Rafter, RN 10/12/2018, 12:19 PM

## 2018-10-12 NOTE — Progress Notes (Signed)
Progress Note  Patient Name: Kenneth Larsen Date of Encounter: 10/12/2018  Primary Cardiologist: Reola Calkins - [facility]  Subjective   Difficult night due to multiple bowel movements.  Patient also with increasing cough with yellow sputum.  No chest pain.  Breathing is improving.  Able to lie almost completely flat most of the night.  Inpatient Medications    Scheduled Meds: . aspirin EC  81 mg Oral Daily  . budesonide (PULMICORT) nebulizer solution  0.5 mg Nebulization BID  . cholecalciferol  1,000 Units Oral Daily  . docusate sodium  100 mg Oral BID  . enoxaparin (LOVENOX) injection  40 mg Subcutaneous Q24H  . guaiFENesin  600 mg Oral BID  . ipratropium-albuterol  3 mL Nebulization BID  . levothyroxine  50 mcg Oral Daily  . mouth rinse  15 mL Mouth Rinse BID  . oseltamivir  75 mg Oral BID  . sodium chloride flush  3 mL Intravenous Once  . sodium chloride flush  3 mL Intravenous Q12H  . vitamin B-12  1,000 mcg Oral Daily   Continuous Infusions: . sodium chloride     PRN Meds: sodium chloride, acetaminophen **OR** acetaminophen, guaiFENesin-dextromethorphan, ipratropium-albuterol, menthol-cetylpyridinium, ondansetron **OR** ondansetron (ZOFRAN) IV, polyethylene glycol, sodium chloride flush, traZODone   Vital Signs    Vitals:   10/11/18 0826 10/11/18 2011 10/11/18 2041 10/12/18 0359  BP: 114/68 133/67  133/75  Pulse: 77 66  80  Resp: 18 18  16   Temp: 98.2 F (36.8 C) 98.7 F (37.1 C)  98.5 F (36.9 C)  TempSrc: Oral Oral  Oral  SpO2: 93% 92% 96% 94%  Weight:    106.2 kg  Height:        Intake/Output Summary (Last 24 hours) at 10/12/2018 0715 Last data filed at 10/11/2018 2142 Gross per 24 hour  Intake 620 ml  Output 1150 ml  Net -530 ml   Last 3 Weights 10/12/2018 10/11/2018 10/10/2018  Weight (lbs) 234 lb 1.6 oz 235 lb 1.6 oz 238 lb 12.8 oz  Weight (kg) 106.187 kg 106.641 kg 108.319 kg      Telemetry    Normal sinus rhythm with frequent PVCs. - Personally  Reviewed  ECG   No new tracing.  Physical Exam   GEN: No acute distress.   Neck: No JVD Cardiac: RRR with 3/6 6 crescendo-decrescendo systolic murmur loudest at the right upper sternal border.  No rubs or gallops.Marland Kitchen  Respiratory:  Faint rhonchi.  No wheezes or crackles. GI: Soft, nontender, non-distended  MS:  Trace ankle edema; No deformity. Neuro:  Nonfocal  Psych: Normal affect   Labs    Chemistry Recent Labs  Lab 10/08/18 1020 10/09/18 0217 10/11/18 0454  NA 126* 128* 130*  K 3.7 4.5 4.2  CL 84* 89* 86*  CO2 35* 34* 39*  GLUCOSE 91 137* 89  BUN 16 21 21   CREATININE 0.95 1.05 0.98  CALCIUM 8.5* 8.2* 8.0*  PROT 6.9  --   --   ALBUMIN 3.9  --   --   AST 20  --   --   ALT 12  --   --   ALKPHOS 50  --   --   BILITOT 1.4*  --   --   GFRNONAA >60 >60 >60  GFRAA >60 >60 >60  ANIONGAP 7 5 5      Hematology Recent Labs  Lab 10/08/18 1020  WBC 5.7  RBC 5.35  HGB 15.8  HCT 48.8  MCV 91.2  MCH 29.5  MCHC 32.4  RDW 12.6  PLT 243    Cardiac Enzymes Recent Labs  Lab 10/08/18 1020 10/08/18 1445 10/08/18 2052 10/09/18 0217  TROPONINI 0.05* 0.06* 0.05* 0.05*   No results for input(s): TROPIPOC in the last 168 hours.   BNP Recent Labs  Lab 10/08/18 1020  BNP 823.0*     DDimer No results for input(s): DDIMER in the last 168 hours.   Radiology    No results found.  Cardiac Studies   Echo (10/10/2018):  1. The left ventricle has low normal systolic function, with an ejection fraction of 50-55%. The cavity size was mildly dilated. Left ventricular diastology could not be evaluated. Not able to evaluate wall motion. 2. The right ventricle has normal systolic function. The cavity was normal. There is no increase in right ventricular wall thickness. 3. Left atrial size was mildly dilated. 4. The mitral valve is normal in structure. There is moderate mitral annular calcification present. 5. The tricuspid valve is normal in structure. 6. The aortic  valve is tricuspid Severe calcifcation of the aortic valve. severe stenosis of the aortic valve. 7. The pulmonic valve was normal in structure. 8. Difficult study overall. Definity was used.  Patient Profile     Breken Nazari Wrightis a 73 y.o.malewith a hx of smoking for more than 25 years quit age 75, hypertension, chronic leg swelling, hypothyroidism,aortic valve disease, presenting with worsening shortness of breath, leg swelling.  Assessment & Plan    Acute respiratory distress This continues to improve.  Patient notes increasing productive cough today.  Treatment of influenza and COPD per IM.  Gentle diuresis.  Will initiate furosemide 40 mg p.o. daily beginning this morning.  HFpEF Patient now able to lie flat with minimal edema on exam.  Transition furosemide to 40 mg p.o. daily.  Plan for right and left heart cath tomorrow.  We will defer to procedure today, as the patient feels unwell from his frequent bowel movements overnight as well as increased cough.  I have reviewed the risks, indications, and alternatives to cardiac catheterization, possible angioplasty, and stenting with the patient. Risks include but are not limited to bleeding, infection, vascular injury, stroke, myocardial infection, arrhythmia, kidney injury, radiation-related injury in the case of prolonged fluoroscopy use, emergency cardiac surgery, and death. The patient understands the risks of serious complication is 1-2 in 1610 with diagnostic cardiac cath and 1-2% or less with angioplasty/stenting.  Severe aortic stenosis Likely contributing to HFpEF and respiratory distress.  Plan for right and left heart catheterization in anticipation of further outpatient work-up in the valve clinic.  Influenza A  Continue oseltamavir and supportive care per internal medicine.  For questions or updates, please contact [place] Please consult [email or web address] for contact info under [facility].      Signed, Nelva Bush, MD  10/12/2018, 7:15 AM

## 2018-10-12 NOTE — Plan of Care (Signed)
Still receiving tamiflu, NPO in hopes for cardiac cath today Problem: Activity: Goal: Risk for activity intolerance will decrease Outcome: Progressing Note:  Up independently in room - wife in room   Problem: Safety: Goal: Ability to remain free from injury will improve Outcome: Progressing   Problem: Clinical Measurements: Goal: Respiratory complications will improve Outcome: Not Progressing Note:  Still on 2L O2 - unable to wean desats to low 80's on room air

## 2018-10-12 NOTE — H&P (View-Only) (Signed)
Progress Note  Patient Name: Kenneth Larsen Date of Encounter: 10/12/2018  Primary Cardiologist: Reola Calkins - [place]  Subjective   Difficult night due to multiple bowel movements.  Patient also with increasing cough with yellow sputum.  No chest pain.  Breathing is improving.  Able to lie almost completely flat most of the night.  Inpatient Medications    Scheduled Meds: . aspirin EC  81 mg Oral Daily  . budesonide (PULMICORT) nebulizer solution  0.5 mg Nebulization BID  . cholecalciferol  1,000 Units Oral Daily  . docusate sodium  100 mg Oral BID  . enoxaparin (LOVENOX) injection  40 mg Subcutaneous Q24H  . guaiFENesin  600 mg Oral BID  . ipratropium-albuterol  3 mL Nebulization BID  . levothyroxine  50 mcg Oral Daily  . mouth rinse  15 mL Mouth Rinse BID  . oseltamivir  75 mg Oral BID  . sodium chloride flush  3 mL Intravenous Once  . sodium chloride flush  3 mL Intravenous Q12H  . vitamin B-12  1,000 mcg Oral Daily   Continuous Infusions: . sodium chloride     PRN Meds: sodium chloride, acetaminophen **OR** acetaminophen, guaiFENesin-dextromethorphan, ipratropium-albuterol, menthol-cetylpyridinium, ondansetron **OR** ondansetron (ZOFRAN) IV, polyethylene glycol, sodium chloride flush, traZODone   Vital Signs    Vitals:   10/11/18 0826 10/11/18 2011 10/11/18 2041 10/12/18 0359  BP: 114/68 133/67  133/75  Pulse: 77 66  80  Resp: 18 18  16   Temp: 98.2 F (36.8 C) 98.7 F (37.1 C)  98.5 F (36.9 C)  TempSrc: Oral Oral  Oral  SpO2: 93% 92% 96% 94%  Weight:    106.2 kg  Height:        Intake/Output Summary (Last 24 hours) at 10/12/2018 0715 Last data filed at 10/11/2018 2142 Gross per 24 hour  Intake 620 ml  Output 1150 ml  Net -530 ml   Last 3 Weights 10/12/2018 10/11/2018 10/10/2018  Weight (lbs) 234 lb 1.6 oz 235 lb 1.6 oz 238 lb 12.8 oz  Weight (kg) 106.187 kg 106.641 kg 108.319 kg      Telemetry    Normal sinus rhythm with frequent PVCs. - Personally  Reviewed  ECG   No new tracing.  Physical Exam   GEN: No acute distress.   Neck: No JVD Cardiac: RRR with 3/6 6 crescendo-decrescendo systolic murmur loudest at the right upper sternal border.  No rubs or gallops.Marland Kitchen  Respiratory:  Faint rhonchi.  No wheezes or crackles. GI: Soft, nontender, non-distended  MS:  Trace ankle edema; No deformity. Neuro:  Nonfocal  Psych: Normal affect   Labs    Chemistry Recent Labs  Lab 10/08/18 1020 10/09/18 0217 10/11/18 0454  NA 126* 128* 130*  K 3.7 4.5 4.2  CL 84* 89* 86*  CO2 35* 34* 39*  GLUCOSE 91 137* 89  BUN 16 21 21   CREATININE 0.95 1.05 0.98  CALCIUM 8.5* 8.2* 8.0*  PROT 6.9  --   --   ALBUMIN 3.9  --   --   AST 20  --   --   ALT 12  --   --   ALKPHOS 50  --   --   BILITOT 1.4*  --   --   GFRNONAA >60 >60 >60  GFRAA >60 >60 >60  ANIONGAP 7 5 5      Hematology Recent Labs  Lab 10/08/18 1020  WBC 5.7  RBC 5.35  HGB 15.8  HCT 48.8  MCV 91.2  MCH 29.5  MCHC 32.4  RDW 12.6  PLT 243    Cardiac Enzymes Recent Labs  Lab 10/08/18 1020 10/08/18 1445 10/08/18 2052 10/09/18 0217  TROPONINI 0.05* 0.06* 0.05* 0.05*   No results for input(s): TROPIPOC in the last 168 hours.   BNP Recent Labs  Lab 10/08/18 1020  BNP 823.0*     DDimer No results for input(s): DDIMER in the last 168 hours.   Radiology    No results found.  Cardiac Studies   Echo (10/10/2018):  1. The left ventricle has low normal systolic function, with an ejection fraction of 50-55%. The cavity size was mildly dilated. Left ventricular diastology could not be evaluated. Not able to evaluate wall motion. 2. The right ventricle has normal systolic function. The cavity was normal. There is no increase in right ventricular wall thickness. 3. Left atrial size was mildly dilated. 4. The mitral valve is normal in structure. There is moderate mitral annular calcification present. 5. The tricuspid valve is normal in structure. 6. The aortic  valve is tricuspid Severe calcifcation of the aortic valve. severe stenosis of the aortic valve. 7. The pulmonic valve was normal in structure. 8. Difficult study overall. Definity was used.  Patient Profile     Kenneth Dykstra Wrightis a 73 y.o.malewith a hx of smoking for more than 25 years quit age 37, hypertension, chronic leg swelling, hypothyroidism,aortic valve disease, presenting with worsening shortness of breath, leg swelling.  Assessment & Plan    Acute respiratory distress This continues to improve.  Patient notes increasing productive cough today.  Treatment of influenza and COPD per IM.  Gentle diuresis.  Will initiate furosemide 40 mg p.o. daily beginning this morning.  HFpEF Patient now able to lie flat with minimal edema on exam.  Transition furosemide to 40 mg p.o. daily.  Plan for right and left heart cath tomorrow.  We will defer to procedure today, as the patient feels unwell from his frequent bowel movements overnight as well as increased cough.  I have reviewed the risks, indications, and alternatives to cardiac catheterization, possible angioplasty, and stenting with the patient. Risks include but are not limited to bleeding, infection, vascular injury, stroke, myocardial infection, arrhythmia, kidney injury, radiation-related injury in the case of prolonged fluoroscopy use, emergency cardiac surgery, and death. The patient understands the risks of serious complication is 1-2 in 7893 with diagnostic cardiac cath and 1-2% or less with angioplasty/stenting.  Severe aortic stenosis Likely contributing to HFpEF and respiratory distress.  Plan for right and left heart catheterization in anticipation of further outpatient work-up in the valve clinic.  Influenza A  Continue oseltamavir and supportive care per internal medicine.  For questions or updates, please contact [place] Please consult [email or web address] for contact info under [facility].      Signed, Nelva Bush, MD  10/12/2018, 7:15 AM

## 2018-10-12 NOTE — Plan of Care (Signed)
  Problem: Clinical Measurements: Goal: Ability to maintain clinical measurements within normal limits will improve Outcome: Progressing   Problem: Education: Goal: Individualized Educational Video(s) Outcome: Progressing Note:  Pt has viewed heart failure videos via EMMI   Problem: Clinical Measurements: Goal: Diagnostic test results will improve Outcome: Not Progressing Note:  BNP 823 on admission, NA 130, Troponins slightly elevated Goal: Respiratory complications will improve Outcome: Not Progressing Note:  Pt positive for flu, 2L oxygen in place, this is acute for pt

## 2018-10-12 NOTE — Progress Notes (Signed)
[place] [facility] [place] NAME: Kenneth Larsen    MR#:  [id number]  DATE OF BIRTH:  May 31, 1946  SUBJECTIVE:  patient has several bowel movements after taking make citrate for constipation. He is a bit tired from it. Cough with sputum. No fever. wife in the room.  REVIEW OF SYSTEMS:   Review of Systems  Constitutional: Negative for chills, fever and weight loss.  HENT: Negative for ear discharge, ear pain and nosebleeds.   Eyes: Negative for blurred vision, pain and discharge.  Respiratory: Positive for cough, sputum production and shortness of breath. Negative for wheezing.   Cardiovascular: Negative for chest pain, palpitations and orthopnea.  Gastrointestinal: Negative for abdominal pain, diarrhea, nausea and vomiting.  Genitourinary: Negative for frequency and urgency.  Musculoskeletal: Negative for back pain and joint pain.  Neurological: Positive for weakness. Negative for sensory change, speech change and focal weakness.  Psychiatric/Behavioral: Negative for depression and hallucinations. The patient is not nervous/anxious.    Tolerating Diet:yes Tolerating PT: ambulatory  DRUG ALLERGIES:   Allergies  Allergen Reactions  . Penicillins Hives    VITALS:  Blood pressure 106/67, pulse 72, temperature 97.8 F (36.6 C), temperature source Oral, resp. rate 16, height 6\' 1"  (1.854 m), weight 106.2 kg, SpO2 99 %.  PHYSICAL EXAMINATION:   Physical Exam  GENERAL:  73 y.o.-year-old patient lying in the bed with no acute distress. Obese EYES: Pupils equal, round, reactive to light and accommodation. No scleral icterus. Extraocular muscles intact.  HEENT: Head atraumatic, normocephalic. Oropharynx and nasopharynx clear.  NECK:  Supple, no jugular venous distention. No thyroid enlargement, no tenderness.  LUNGS decreased  breath sounds bilaterally, no wheezing, rales, rhonchi. No use of accessory muscles of respiration.  CARDIOVASCULAR:  S1, S2 normal. Systolic harsh murmurs, rubs, or gallops.  ABDOMEN: Soft, nontender, nondistended. Bowel sounds present. No organomegaly or mass.  EXTREMITIES: + edema b/l.    NEUROLOGIC: Cranial nerves II through XII are intact. No focal Motor or sensory deficits b/l.   PSYCHIATRIC:  patient is alert and oriented x 3.  SKIN: No obvious rash, lesion, or ulcer.   LABORATORY PANEL:  CBC Recent Labs  Lab 10/08/18 1020  WBC 5.7  HGB 15.8  HCT 48.8  PLT 243    Chemistries  Recent Labs  Lab 10/08/18 1020  10/11/18 0454  NA 126*   < > 130*  K 3.7   < > 4.2  CL 84*   < > 86*  CO2 35*   < > 39*  GLUCOSE 91   < > 89  BUN 16   < > 21  CREATININE 0.95   < > 0.98  CALCIUM 8.5*   < > 8.0*  AST 20  --   --   ALT 12  --   --   ALKPHOS 50  --   --   BILITOT 1.4*  --   --    < > = values in this interval not displayed.   Cardiac Enzymes Recent Labs  Lab 10/09/18 0217  TROPONINI 0.05*   RADIOLOGY:  No results found. ASSESSMENT AND PLAN:  Kenneth Larsen  is a 73 y.o. male with a known history per below presenting to the emergency room with 2 to 3-day history of worsening shortness of breath, worsening leg swelling, was seen in neuro clinic earlier today found to be hypoxic with O2 saturation in the 80s, placed on oxygen via nasal cannula to the emergency room.  *  Acute hypoxic respiratory failure -Suspect due to multifactorial process that includes acute influenza A infection and probable acute new onset congestive heart failure exacerbation and COPD -supplemental oxygen wean as tolerated -lasix 40 mg qd per cardiology  *Influenza A infection Tamiflu twice daily  *Acute Systolic CHF with  history of aortic stenosis looks severe Per echo. --Congestive heart failure protocol -strict I&O monitoring, daily weights -diuresis well with IV Lasix no change to oral Lasix. -cardiology consultation from Dr. Emelia Loron appreciated. -Plan noted for heart catheterization on Thursday,  February 20  *elevated troponins Most likely secondary to demand ischemia  * benign essential hypertension -currently not on any antihypertensives.  Discussed with Dr. Saunders Revel   CODE STATUS: partial  DVT Prophylaxis: Lovenox  TOTAL TIME TAKING CARE OF THIS PATIENT: *30** minutes.  >50% time spent on counselling and coordination of care  POSSIBLE D/C 1-2 DAYS, DEPENDING ON CLINICAL CONDITION.  Note: This dictation was prepared with Dragon dictation along with smaller phrase technology. Any transcriptional errors that result from this process are unintentional.  [name] M.D on 10/12/2018 at 8:26 AM  Between 7am to 6pm - Pager - [phone]  After 6pm go to [email or web address] - password [email or web address] [place] [facility]  Office  [phone]  CC: Primary care physician; [facility] HealthPatient ID: Kenneth Larsen, male   DOB: 10/22/1945, 73 y.o.   MRN: [id number]

## 2018-10-12 NOTE — Plan of Care (Signed)
  Problem: Cardiovascular: Goal: Ability to achieve and maintain adequate cardiovascular perfusion will improve Outcome: Progressing Note:  Cardiac cath video shown via EMMI

## 2018-10-12 NOTE — Progress Notes (Signed)
Pt complaining of productive cough with yellow thick sputum.. wanting something for cough, this RN placed orders for robitussin DM... which are under there general RN standing orders. Will give and continue to monitor. Conley Simmonds, RN, BSN

## 2018-10-13 ENCOUNTER — Encounter
Admission: EM | Disposition: A | Discharge status: Home / Self Care | Payer: Self-pay | Source: Home / Self Care | Attending: Internal Medicine

## 2018-10-13 ENCOUNTER — Encounter: Payer: Self-pay | Admitting: Internal Medicine

## 2018-10-13 DIAGNOSIS — I350 Nonrheumatic aortic (valve) stenosis: Secondary | ICD-10-CM

## 2018-10-13 DIAGNOSIS — I5031 Acute diastolic (congestive) heart failure: Secondary | ICD-10-CM

## 2018-10-13 HISTORY — PX: RIGHT/LEFT HEART CATH AND CORONARY ANGIOGRAPHY: CATH118266

## 2018-10-13 LAB — BASIC METABOLIC PANEL
Anion gap: 9 (ref 5–15)
BUN: 18 mg/dL (ref 8–23)
CO2: 35 mmol/L — ABNORMAL HIGH (ref 22–32)
Calcium: 8.1 mg/dL — ABNORMAL LOW (ref 8.9–10.3)
Chloride: 87 mmol/L — ABNORMAL LOW (ref 98–111)
Creatinine, Ser: 0.87 mg/dL (ref 0.61–1.24)
GFR calc Af Amer: >60 mL/min (ref >60)
GLUCOSE: 97 mg/dL (ref 70–99)
POTASSIUM: 4 mmol/L (ref 3.5–5.1)
Sodium: 131 mmol/L — ABNORMAL LOW (ref 135–145)

## 2018-10-13 LAB — CBC
HCT: 44.1 % (ref 39.0–52.0)
HEMOGLOBIN: 14.2 g/dL (ref 13.0–17.0)
MCH: 29.3 pg (ref 26.0–34.0)
MCHC: 32.2 g/dL (ref 30.0–36.0)
MCV: 90.9 fL (ref 80.0–100.0)
Platelets: 190 10*3/uL (ref 150–400)
RBC: 4.85 MIL/uL (ref 4.22–5.81)
RDW: 12.5 % (ref 11.5–15.5)
WBC: 4.9 10*3/uL (ref 4.0–10.5)
nRBC: 0 % (ref 0.0–0.2)

## 2018-10-13 LAB — CULTURE, BLOOD (ROUTINE X 2)
CULTURE: NO GROWTH
Culture: NO GROWTH
Special Requests: ADEQUATE
Special Requests: ADEQUATE

## 2018-10-13 SURGERY — RIGHT/LEFT HEART CATH AND CORONARY ANGIOGRAPHY
Anesthesia: Moderate Sedation

## 2018-10-13 MED ORDER — FENTANYL CITRATE (PF) 100 MCG/2ML IJ SOLN
INTRAMUSCULAR | Status: DC | PRN
  Administered 2018-10-13: 25 ug via INTRAVENOUS

## 2018-10-13 MED ORDER — ATORVASTATIN CALCIUM 20 MG PO TABS
20.0000 mg | ORAL_TABLET | Freq: Every day | ORAL | Status: DC
  Administered 2018-10-13: 20 mg via ORAL
  Filled 2018-10-13: qty 1

## 2018-10-13 MED ORDER — HEPARIN (PORCINE) IN NACL 1000-0.9 UT/500ML-% IV SOLN
INTRAVENOUS | Status: DC | PRN
  Administered 2018-10-13: 500 mL

## 2018-10-13 MED ORDER — FUROSEMIDE 10 MG/ML IJ SOLN
40.0000 mg | Freq: Two times a day (BID) | INTRAMUSCULAR | Status: DC
  Administered 2018-10-13 – 2018-10-14 (×2): 40 mg via INTRAVENOUS
  Filled 2018-10-13 (×2): qty 4

## 2018-10-13 MED ORDER — HEPARIN (PORCINE) IN NACL 1000-0.9 UT/500ML-% IV SOLN: INTRAVENOUS | Status: DC | PRN

## 2018-10-13 MED ORDER — VERAPAMIL HCL 2.5 MG/ML IV SOLN
INTRAVENOUS | Status: DC | PRN
  Administered 2018-10-13: 2.5 mg via INTRA_ARTERIAL

## 2018-10-13 MED ORDER — VERAPAMIL HCL 2.5 MG/ML IV SOLN
INTRAVENOUS | Status: AC
  Filled 2018-10-13: qty 2

## 2018-10-13 MED ORDER — HEPARIN SODIUM (PORCINE) 1000 UNIT/ML IJ SOLN
INTRAMUSCULAR | Status: DC | PRN
  Administered 2018-10-13: 5000 [IU] via INTRAVENOUS

## 2018-10-13 MED ORDER — MIDAZOLAM HCL 2 MG/2ML IJ SOLN
INTRAMUSCULAR | Status: AC
  Filled 2018-10-13: qty 2

## 2018-10-13 MED ORDER — IOPAMIDOL (ISOVUE-300) INJECTION 61%
INTRAVENOUS | Status: DC | PRN
  Administered 2018-10-13: 55 mL via INTRA_ARTERIAL

## 2018-10-13 MED ORDER — HEPARIN SODIUM (PORCINE) 1000 UNIT/ML IJ SOLN
INTRAMUSCULAR | Status: AC
  Filled 2018-10-13: qty 1

## 2018-10-13 MED ORDER — HEPARIN (PORCINE) IN NACL 1000-0.9 UT/500ML-% IV SOLN
INTRAVENOUS | Status: AC
  Filled 2018-10-13: qty 1000

## 2018-10-13 MED ORDER — FENTANYL CITRATE (PF) 100 MCG/2ML IJ SOLN
INTRAMUSCULAR | Status: AC
  Filled 2018-10-13: qty 2

## 2018-10-13 MED ORDER — MIDAZOLAM HCL 2 MG/2ML IJ SOLN
INTRAMUSCULAR | Status: DC | PRN
  Administered 2018-10-13: 1 mg via INTRAVENOUS

## 2018-10-13 SURGICAL SUPPLY — 10 items
CATH 5F 110X4 TIG (CATHETERS) ×2 IMPLANT
CATH BALLN WEDGE 5F 110CM (CATHETERS) ×2 IMPLANT
DEVICE RAD TR BAND REGULAR (VASCULAR PRODUCTS) ×4 IMPLANT
GLIDESHEATH SLEND SS 6F .021 (SHEATH) ×2 IMPLANT
GUIDEWIRE .025 260CM (WIRE) ×2 IMPLANT
KIT MANI 3VAL PERCEP (MISCELLANEOUS) ×2 IMPLANT
KIT RIGHT HEART (MISCELLANEOUS) ×2 IMPLANT
PACK CARDIAC CATH (CUSTOM PROCEDURE TRAY) ×2 IMPLANT
SHEATH GLIDE SLENDER 4/5FR (SHEATH) ×2 IMPLANT
WIRE ROSEN-J .035X260CM (WIRE) ×2 IMPLANT

## 2018-10-13 NOTE — Progress Notes (Signed)
SATURATION QUALIFICATIONS: (This note is used to comply with regulatory documentation for home oxygen)  Patient Saturations on Room Air at Rest = 90%  Patient Saturations on Room Air while Ambulating = 81%  Patient Saturations on 2 Liters of oxygen while Ambulating = 90%  Please briefly explain why patient needs home oxygen: desats on room air with ambulation

## 2018-10-13 NOTE — Care Management Note (Signed)
Case Management Note  Patient Details  Name: Kenneth Larsen MRN: [id number] Date of Birth: [birth year]  Subjective/Objective:       Patient has qualified for home oxygen.  Referral made to [facility] with [facility].  Will deliver to bedside prior to DC. Offered HH services again.  Patient declines.                 Action/Plan:   Expected Discharge Date:                  Expected Discharge Plan:  Home/Self Care  In-House Referral:     Discharge planning Services  CM Consult  Post Acute Care Choice:  Durable Medical Equipment Choice offered to:     DME Arranged:  Oxygen DME Agency:  [place] Arranged:  Patient Refused [place] Agency:     Status of Service:  Completed, signed off  If discussed at [place] of Stay Meetings, dates discussed:    Additional Comments:  Elza Rafter, RN 10/13/2018, 2:34 PM

## 2018-10-13 NOTE — Progress Notes (Signed)
Progress Note  Patient Name: Kenneth Larsen Date of Encounter: 10/13/2018  Primary Cardiologist: Kenneth Larsen  Subjective   Patient feels better today.  No further diarrhea.  Breathing also better with less cough.  Nursing attempted to wean him from oxygen yesterday, though he desaturated on room air.  No chest pain.  Inpatient Medications    Scheduled Meds: . aspirin EC  81 mg Oral Daily  . budesonide (PULMICORT) nebulizer solution  0.5 mg Nebulization BID  . cholecalciferol  1,000 Units Oral Daily  . docusate sodium  100 mg Oral BID  . furosemide  40 mg Intravenous BID  . guaiFENesin  600 mg Oral BID  . ipratropium-albuterol  3 mL Nebulization BID  . levothyroxine  50 mcg Oral Daily  . mouth rinse  15 mL Mouth Rinse BID  . sodium chloride flush  3 mL Intravenous Once  . sodium chloride flush  3 mL Intravenous Q12H  . vitamin B-12  1,000 mcg Oral Daily   Continuous Infusions:  PRN Meds: acetaminophen **OR** acetaminophen, guaiFENesin-dextromethorphan, ipratropium-albuterol, menthol-cetylpyridinium, ondansetron **OR** ondansetron (ZOFRAN) IV, polyethylene glycol, sodium chloride flush, traZODone   Vital Signs    Vitals:   10/13/18 1115 10/13/18 1130 10/13/18 1200 10/13/18 1238  BP: (!) 118/59 (!) 104/59 110/63 121/68  Pulse: 76 76 70 68  Resp: 17 18 (!) 22   Temp:    98.7 F (37.1 C)  TempSrc:    Oral  SpO2: 94% 93% 92% 97%  Weight:      Height:        Intake/Output Summary (Last 24 hours) at 10/13/2018 1432 Last data filed at 10/13/2018 1300 Gross per 24 hour  Intake -  Output 1150 ml  Net -1150 ml   Last 3 Weights 10/13/2018 10/13/2018 10/12/2018  Weight (lbs) 233 lb 6.4 oz 233 lb 6.4 oz 234 lb 1.6 oz  Weight (kg) 105.87 kg 105.87 kg 106.187 kg      Telemetry    Normal sinus rhythm with PVCs - Personally Reviewed  ECG    No new tracing.  Physical Exam   GEN: No acute distress.   Neck: No JVD Cardiac: RRR with occasional extrasystoles.  3/6  crescendo-decrescendo systolic murmur loudest at the RUSB. Respiratory:  Mildly diminished breath sounds at both lung bases.  No wheezes or crackles. GI: Soft, nontender, non-distended  MS: No edema; No deformity. Neuro:  Nonfocal  Psych: Normal affect   Labs    Chemistry Recent Labs  Lab 10/08/18 1020 10/09/18 0217 10/11/18 0454 10/13/18 0413  NA 126* 128* 130* 131*  K 3.7 4.5 4.2 4.0  CL 84* 89* 86* 87*  CO2 35* 34* 39* 35*  GLUCOSE 91 137* 89 97  BUN 16 21 21 18   CREATININE 0.95 1.05 0.98 0.87  CALCIUM 8.5* 8.2* 8.0* 8.1*  PROT 6.9  --   --   --   ALBUMIN 3.9  --   --   --   AST 20  --   --   --   ALT 12  --   --   --   ALKPHOS 50  --   --   --   BILITOT 1.4*  --   --   --   GFRNONAA >60 >60 >60 >60  GFRAA >60 >60 >60 >60  ANIONGAP 7 5 5 9      Hematology Recent Labs  Lab 10/08/18 1020 10/13/18 0413  WBC 5.7 4.9  RBC 5.35 4.85  HGB 15.8 14.2  HCT 48.8 44.1  MCV 91.2 90.9  MCH 29.5 29.3  MCHC 32.4 32.2  RDW 12.6 12.5  PLT 243 190    Cardiac Enzymes Recent Labs  Lab 10/08/18 1020 10/08/18 1445 10/08/18 2052 10/09/18 0217  TROPONINI 0.05* 0.06* 0.05* 0.05*   No results for input(s): TROPIPOC in the last 168 hours.   BNP Recent Labs  Lab 10/08/18 1020  BNP 823.0*     DDimer No results for input(s): DDIMER in the last 168 hours.   Radiology    No results found.  Cardiac Studies   L/RHC (10/13/2018): 1. Mild to moderate, non-obstructive coronary artery disease. 2. Moderately elevated left heart, right heart, and pulmonary artery pressures. 3. Normal Fick cardiac output/index.  Echo (10/10/2018):  1. The left ventricle has low normal systolic function, with an ejection fraction of 50-55%. The cavity size was mildly dilated. Left ventricular diastology could not be evaluated. Not able to evaluate wall motion.  2. The right ventricle has normal systolic function. The cavity was normal. There is no increase in right ventricular wall  thickness.  3. Left atrial size was mildly dilated.  4. The mitral valve is normal in structure. There is moderate mitral annular calcification present.  5. The tricuspid valve is normal in structure.  6. The aortic valve is tricuspid Severe calcifcation of the aortic valve. severe stenosis of the aortic valve.  7. The pulmonic valve was normal in structure.  8. Difficult study overall. Definity was used.  Patient Profile     Kenneth Larsen a 73 y.o.malewith a hx of smoking for more than 25 years quit age 11, hypertension, chronic leg swelling, hypothyroidism,aortic valve disease, presenting with worsening shortness of breath, leg swelling.  Assessment & Plan    HFpEF Clinically, the patient appears mildly volume overloaded.  However, he remains hypoxic on room air and also has moderately elevated left and right heart filling pressures.  I think he would benefit from at least 1 more day of IV diuresis.  I will start furosemide 40 mg IV twice daily this afternoon.  If his breathing/oxygen requirements are better tomorrow, he could be discharged on oral diuretic therapy, likely 40 mg p.o. twice daily, with close outpatient monitoring.  Severe aortic stenosis Most likely underlying much of the patient's HFpEF.  Catheterization today showed nonobstructive CAD.  I therefore believe Kenneth Larsen could be a good candidate for TAVR.  I have referred him to our TAVR clinic in [place] for further evaluation and management.  Nonobstructive coronary artery disease No angina reported.  Continue aspirin 81 mg daily.  I will add atorvastatin 20 mg daily to prevent progression of disease.  Influenza Continue supportive care and medical therapy per internal medicine.  For questions or updates, please contact Kenneth [place] Please consult [email or web address] for contact info under [facility].  Signed, Kenneth Bush, MD  10/13/2018, 2:32 PM

## 2018-10-13 NOTE — Interval H&P Note (Signed)
History and Physical Interval Note:  10/13/2018 9:07 AM  Kenneth Larsen  has presented today for cardiac catheterization, with the diagnosis of HFpEF and severe aortic stenosis  The various methods of treatment have been discussed with the patient and family. After consideration of risks, benefits and other options for treatment, the patient has consented to  Procedure(s): RIGHT/LEFT HEART CATH AND CORONARY ANGIOGRAPHY (N/A) as a surgical intervention .  The patient's history has been reviewed, patient examined, no change in status, stable for surgery.  I have reviewed the patient's chart and labs.  Questions were answered to the patient's satisfaction.    Cath Lab Visit (complete for each Cath Lab visit)  Clinical Evaluation Leading to the Procedure:   ACS: No.  Non-ACS:    Anginal/Heart Failure Classification: NYHA class IV  Anti-ischemic medical therapy: Minimal Therapy (1 class of medications)  Non-Invasive Test Results: No non-invasive testing performed  Prior CABG: No previous CABG  Kenneth Larsen

## 2018-10-13 NOTE — Progress Notes (Signed)
[facility] [facility] [place] NAME: Kenneth Larsen    MR#:  [id number]  DATE OF BIRTH:  08/18/1946  SUBJECTIVE:  No new complaints. No fever. wife in the room.  REVIEW OF SYSTEMS:   Review of Systems  Constitutional: Negative for chills, fever and weight loss.  HENT: Negative for ear discharge, ear pain and nosebleeds.   Eyes: Negative for blurred vision, pain and discharge.  Respiratory: Positive for cough, sputum production and shortness of breath. Negative for wheezing.   Cardiovascular: Negative for chest pain, palpitations and orthopnea.  Gastrointestinal: Negative for abdominal pain, diarrhea, nausea and vomiting.  Genitourinary: Negative for frequency and urgency.  Musculoskeletal: Negative for back pain and joint pain.  Neurological: Positive for weakness. Negative for sensory change, speech change and focal weakness.  Psychiatric/Behavioral: Negative for depression and hallucinations. The patient is not nervous/anxious.    Tolerating Diet:yes Tolerating PT: ambulatory  DRUG ALLERGIES:   Allergies  Allergen Reactions  . Penicillins Hives    VITALS:  Blood pressure 110/66, pulse 73, temperature 98.3 F (36.8 C), temperature source Oral, resp. rate 20, height 6\' 1"  (1.854 m), weight 105.9 kg, SpO2 96 %.  PHYSICAL EXAMINATION:   Physical Exam  GENERAL:  73 y.o.-year-old patient lying in the bed with no acute distress. Obese EYES: Pupils equal, round, reactive to light and accommodation. No scleral icterus. Extraocular muscles intact.  HEENT: Head atraumatic, normocephalic. Oropharynx and nasopharynx clear.  NECK:  Supple, no jugular venous distention. No thyroid enlargement, no tenderness.  LUNGS decreased  breath sounds bilaterally, no wheezing, rales, rhonchi. No use of accessory muscles of respiration.  CARDIOVASCULAR: S1, S2 normal. Systolic harsh murmurs, rubs, or gallops.  ABDOMEN: Soft, nontender, nondistended. Bowel  sounds present. No organomegaly or mass.  EXTREMITIES: + edema b/l.    NEUROLOGIC: Cranial nerves II through XII are intact. No focal Motor or sensory deficits b/l.   PSYCHIATRIC:  patient is alert and oriented x 3.  SKIN: No obvious rash, lesion, or ulcer.   LABORATORY PANEL:  CBC Recent Labs  Lab 10/13/18 0413  WBC 4.9  HGB 14.2  HCT 44.1  PLT 190    Chemistries  Recent Labs  Lab 10/08/18 1020  10/13/18 0413  NA 126*   < > 131*  K 3.7   < > 4.0  CL 84*   < > 87*  CO2 35*   < > 35*  GLUCOSE 91   < > 97  BUN 16   < > 18  CREATININE 0.95   < > 0.87  CALCIUM 8.5*   < > 8.1*  AST 20  --   --   ALT 12  --   --   ALKPHOS 50  --   --   BILITOT 1.4*  --   --    < > = values in this interval not displayed.   Cardiac Enzymes Recent Labs  Lab 10/09/18 0217  TROPONINI 0.05*   RADIOLOGY:  No results found. ASSESSMENT AND PLAN:  Kenneth Larsen  is a 73 y.o. male with a known history per below presenting to the emergency room with 2 to 3-day history of worsening shortness of breath, worsening leg swelling, was seen in neuro clinic earlier today found to be hypoxic with O2 saturation in the 80s, placed on oxygen via nasal cannula to the emergency room.  *Acute hypoxic respiratory failure -Suspect due to multifactorial process that includes acute influenza A infection and probable acute  new onset congestive heart failure exacerbation and COPD -supplemental oxygen wean as tolerated -lasix 40 mg qd per cardiology -UOP total 5600 cc  *Influenza A infection Tamiflu twice daily  *Acute Systolic CHF with  history of aortic stenosis looks severe Per echo. --Congestive heart failure protocol -strict I&O monitoring, daily weights -diuresis well with IV Lasix  -now changeg to oral Lasix. -cardiology consultation from Dr. Emelia Loron appreciated. -Plan noted for heart catheterization on Thursday, February 20 -assess for home oxygen  *elevated troponins Most likely secondary to  demand ischemia  * benign essential hypertension -currently not on any antihypertensives.(was on amlodipine and HCTZ/triamterene)  Discussed with Dr. Saunders Revel   CODE STATUS: partial  DVT Prophylaxis: Lovenox  TOTAL TIME TAKING CARE OF THIS PATIENT: *30* minutes.  >50% time spent on counselling and coordination of care  POSSIBLE D/C 1-2 DAYS, DEPENDING ON CLINICAL CONDITION.  Note: This dictation was prepared with Dragon dictation along with smaller phrase technology. Any transcriptional errors that result from this process are unintentional.  [name] M.D on 10/13/2018 at 7:22 AM  Between 7am to 6pm - Pager - [phone]  After 6pm go to [email or web address] - password [email or web address] [place] [facility]  Office  [phone]  CC: Primary care physician; Center, [facility] HealthPatient ID: Kenneth Larsen, male   DOB: [birth year], 73 y.o.   MRN: [id number]

## 2018-10-14 LAB — LIPID PANEL
Cholesterol: 117 mg/dL (ref 0–200)
HDL: 46 mg/dL (ref >40)
LDL Cholesterol: 65 mg/dL (ref 0–99)
Total CHOL/HDL Ratio: 2.5 RATIO
Triglycerides: 29 mg/dL (ref <150)
VLDL: 6 mg/dL (ref 0–40)

## 2018-10-14 LAB — BASIC METABOLIC PANEL
Anion gap: 8 (ref 5–15)
BUN: 15 mg/dL (ref 8–23)
CHLORIDE: 87 mmol/L — AB (ref 98–111)
CO2: 34 mmol/L — ABNORMAL HIGH (ref 22–32)
CREATININE: 0.72 mg/dL (ref 0.61–1.24)
Calcium: 8.2 mg/dL — ABNORMAL LOW (ref 8.9–10.3)
GFR calc Af Amer: >60 mL/min (ref >60)
GFR calc non Af Amer: >60 mL/min (ref >60)
Glucose, Bld: 113 mg/dL — ABNORMAL HIGH (ref 70–99)
Potassium: 3.8 mmol/L (ref 3.5–5.1)
Sodium: 129 mmol/L — ABNORMAL LOW (ref 135–145)

## 2018-10-14 MED ORDER — ATORVASTATIN CALCIUM 20 MG PO TABS: 20.0000 mg | ORAL_TABLET | Freq: Every day | ORAL | 1 refills | Status: DC

## 2018-10-14 MED ORDER — FUROSEMIDE 40 MG PO TABS: 40.0000 mg | ORAL_TABLET | Freq: Every day | ORAL | 11 refills | Status: DC

## 2018-10-14 MED ORDER — ASPIRIN 81 MG PO TBEC: 81.0000 mg | DELAYED_RELEASE_TABLET | Freq: Every day | ORAL | 0 refills | Status: DC

## 2018-10-14 NOTE — Progress Notes (Signed)
  [facility]   MULTIDISCIPLINARY HEART VALVE TEAM  Arranged for an appointment with Dr. Angelena Form to discuss TAVR on 2/26 @ 2pm. Details have been discussed with his wife and appt information placed in follow up section of chart.    Angelena Form PA-C  [facility]

## 2018-10-14 NOTE — Progress Notes (Addendum)
BP 100/62, Per Murray Hodgkins, NP okay to give IV lasix 40 mg. Will continue to monitor.

## 2018-10-14 NOTE — Progress Notes (Signed)
Progress Note  Patient Name: Kenneth Larsen Date of Encounter: 10/14/2018  Primary Cardiologist: New - [place]  Subjective   Still with significant bronchospastic cough On oxygen, Reports they are ordering oxygen for home Had several doses of IV Lasix, no shortness of breath, leg swelling much better Discussed recent cardiac catheterization findings with him, elevated end-diastolic pressures  Inpatient Medications    Scheduled Meds: . aspirin EC  81 mg Oral Daily  . budesonide (PULMICORT) nebulizer solution  0.5 mg Nebulization BID  . cholecalciferol  1,000 Units Oral Daily  . docusate sodium  100 mg Oral BID  . furosemide  40 mg Intravenous BID  . guaiFENesin  600 mg Oral BID  . ipratropium-albuterol  3 mL Nebulization BID  . levothyroxine  50 mcg Oral Daily  . mouth rinse  15 mL Mouth Rinse BID  . sodium chloride flush  3 mL Intravenous Once  . sodium chloride flush  3 mL Intravenous Q12H  . vitamin B-12  1,000 mcg Oral Daily   Continuous Infusions: PRN Meds: acetaminophen **OR** acetaminophen, guaiFENesin-dextromethorphan, ipratropium-albuterol, menthol-cetylpyridinium, ondansetron **OR** ondansetron (ZOFRAN) IV, polyethylene glycol, sodium chloride flush, traZODone      Vital Signs    Vitals:   10/14/18 0608 10/14/18 0748 10/14/18 0918 10/14/18 0920  BP:   (!) 94/57 100/62  Pulse:   90 86  Resp:   18   Temp:   98.3 F (36.8 C)   TempSrc:   Oral   SpO2:  96% 95%   Weight: 105.5 kg     Height:        Intake/Output Summary (Last 24 hours) at 10/14/2018 2036 Last data filed at 10/14/2018 0845 Gross per 24 hour  Intake 0 ml  Output 700 ml  Net -700 ml   Last 3 Weights 10/14/2018 10/13/2018 10/13/2018  Weight (lbs) 232 lb 9.6 oz 233 lb 6.4 oz 233 lb 6.4 oz  Weight (kg) 105.507 kg 105.87 kg 105.87 kg      Telemetry    Normal sinus rhythm with PVCs - Personally Reviewed  ECG    No new tracing.  Physical Exam   Constitutional:  oriented to person,  place, and time. No distress.  Significant cough on oxygen HENT:  Head: Grossly normal Eyes:  no discharge. No scleral icterus.  Neck: No JVD, no carotid bruits  Cardiovascular: Regular rate and rhythm, 3/6 systolic ejection murmur appreciated right sternal border Pulmonary/Chest: Coarse breath sounds bilaterally Abdominal: Soft.  no distension.  no tenderness.  Musculoskeletal: Normal range of motion Neurological:  normal muscle tone. Coordination normal. No atrophy Skin: Skin warm and dry Psychiatric: normal affect, pleasant   Labs    Chemistry Recent Labs  Lab 10/08/18 1020  10/11/18 0454 10/13/18 0413 10/14/18 0627  NA 126*   < > 130* 131* 129*  K 3.7   < > 4.2 4.0 3.8  CL 84*   < > 86* 87* 87*  CO2 35*   < > 39* 35* 34*  GLUCOSE 91   < > 89 97 113*  BUN 16   < > 21 18 15   CREATININE 0.95   < > 0.98 0.87 0.72  CALCIUM 8.5*   < > 8.0* 8.1* 8.2*  PROT 6.9  --   --   --   --   ALBUMIN 3.9  --   --   --   --   AST 20  --   --   --   --  ALT 12  --   --   --   --   ALKPHOS 50  --   --   --   --   BILITOT 1.4*  --   --   --   --   GFRNONAA >60   < > >60 >60 >60  GFRAA >60   < > >60 >60 >60  ANIONGAP 7   < > 5 9 8    < > = values in this interval not displayed.     Hematology Recent Labs  Lab 10/08/18 1020 10/13/18 0413  WBC 5.7 4.9  RBC 5.35 4.85  HGB 15.8 14.2  HCT 48.8 44.1  MCV 91.2 90.9  MCH 29.5 29.3  MCHC 32.4 32.2  RDW 12.6 12.5  PLT 243 190    Cardiac Enzymes Recent Labs  Lab 10/08/18 1020 10/08/18 1445 10/08/18 2052 10/09/18 0217  TROPONINI 0.05* 0.06* 0.05* 0.05*   No results for input(s): TROPIPOC in the last 168 hours.   BNP Recent Labs  Lab 10/08/18 1020  BNP 823.0*     DDimer No results for input(s): DDIMER in the last 168 hours.   Radiology    No results found.  Cardiac Studies   L/RHC (10/13/2018): 1. Mild to moderate, non-obstructive coronary artery disease. 2. Moderately elevated left heart, right heart, and  pulmonary artery pressures. 3. Normal Fick cardiac output/index.  Echo (10/10/2018):  1. The left ventricle has low normal systolic function, with an ejection fraction of 50-55%. The cavity size was mildly dilated. Left ventricular diastology could not be evaluated. Not able to evaluate wall motion.  2. The right ventricle has normal systolic function. The cavity was normal. There is no increase in right ventricular wall thickness.  3. Left atrial size was mildly dilated.  4. The mitral valve is normal in structure. There is moderate mitral annular calcification present.  5. The tricuspid valve is normal in structure.  6. The aortic valve is tricuspid Severe calcifcation of the aortic valve. severe stenosis of the aortic valve.  7. The pulmonic valve was normal in structure.  8. Difficult study overall. Definity was used.  Patient Profile     Kenneth Dufrane Wrightis a 73 y.o.malewith a hx of smoking for more than 25 years quit age 89, hypertension, chronic leg swelling, hypothyroidism,aortic valve disease, presenting with worsening shortness of breath, leg swelling, severe aortic valve stenosis, Valenza positive And Assessment & Plan    Influenza Predominant reason for his admission Cough, fever, malaise, Slow improvement, still on nasal cannula oxygen Discussed with him that recovery will likely take several weeks before cough resolves  HFpEF Normal ejection fraction, elevated end-diastolic pressure Had Lasix IV following catheterization Long discussion with him concerning CHF management Stressed importance of monitoring weight at home Suggested CHF clinic follow-up, close follow-up in our clinic Lasix 40 twice daily Discussed if weight stabilizes and he moderate his fluid intake may be able to take Lasix 40 daily with extra Lasix after lunch for 3 pound weight gain -Long discussion concerning fluids and monitoring weight  Severe aortic stenosis May be a good candidate for TAVR.    Notes indicating referral has been placed  Nonobstructive coronary artery disease Aspirin Lipitor  All questions answered Significant CHF discussion during management of weight, fluids, adjustment of medications And discussed CHF resources Discussed process for aortic valve disease work-up  Total encounter time more than 45 minutes  Greater than 50% was spent in counseling and coordination of care with the patient  For questions or updates, please contact [place] Please consult [email or web address] for contact info under [facility].  Signed, Ida Rogue, MD  10/14/2018, 8:36 PM

## 2018-10-14 NOTE — Progress Notes (Signed)
Kenneth Larsen to be D/C'd Home per MD order.  Discussed prescriptions and follow up appointments with the patient. Prescriptions were eprescribed, medication list explained in detail. Pt verbalized understanding. Portable oxygen and tank delivered to room.  Allergies as of 10/14/2018      Reactions   Penicillins Hives      Medication List    STOP taking these medications   amLODipine 5 MG tablet Commonly known as:  NORVASC   triamterene-hydrochlorothiazide 37.5-25 MG tablet Commonly known as:  MAXZIDE-25     TAKE these medications   aspirin 81 MG EC tablet Take 1 tablet (81 mg total) by mouth daily. Start taking on:  October 15, 2018   atorvastatin 20 MG tablet Commonly known as:  LIPITOR Take 1 tablet (20 mg total) by mouth daily at 6 PM.   furosemide 40 MG tablet Commonly known as:  LASIX Take 1 tablet (40 mg total) by mouth daily.   levothyroxine 50 MCG tablet Commonly known as:  SYNTHROID, LEVOTHROID Take 50 mcg by mouth daily.   vitamin B-12 1000 MCG tablet Commonly known as:  CYANOCOBALAMIN Take 1,000 mcg by mouth daily.   VITAMIN D-1000 MAX ST 25 MCG (1000 UT) tablet Generic drug:  Cholecalciferol Take 1,000 Units by mouth daily.            Durable Medical Equipment  (From admission, onward)         Start     Ordered   10/14/18 1142  DME Oxygen  Once    Question Answer Comment  Mode or (Route) Nasal cannula   Liters per Minute 2   Frequency Continuous (stationary and portable oxygen unit needed)   Oxygen conserving device Yes   Oxygen delivery system Gas      10/14/18 1142          Vitals:   10/14/18 0918 10/14/18 0920  BP: (!) 94/57 100/62  Pulse: 90 86  Resp: 18   Temp: 98.3 F (36.8 C)   SpO2: 95%     Tele box removed and returned. Skin clean, dry and intact without evidence of skin break down, no evidence of skin tears noted. IV catheter discontinued intact. Site without signs and symptoms of complications. Dressing and pressure  applied. Pt denies pain at this time. No complaints noted.  An After Visit Summary was printed and given to the patient. Patient escorted via [place], and D/C home via private auto.  Kenneth Larsen

## 2018-10-14 NOTE — Care Management Important Message (Signed)
Important Message  Patient Details  Name: Kenneth Larsen MRN: [id number] Date of Birth: 10/03/1945   Medicare Important Message Given:  Yes    Elza Rafter, RN 10/14/2018, 2:45 PM

## 2018-10-14 NOTE — Discharge Summary (Signed)
[facility] at [place] NAME: Kenneth Larsen    MR#:  [id number]  DATE OF BIRTH:  Jan 05, 1946  DATE OF ADMISSION:  10/08/2018 ADMITTING PHYSICIAN: Gorden Harms, MD  DATE OF DISCHARGE: 10/14/2018  PRIMARY CARE PHYSICIAN: [facility], [place]    ADMISSION DIAGNOSIS:  Influenza A [J10.1] Sepsis ([place]) [A41.9] Acute on chronic respiratory failure with hypoxia ([place]) [J96.21]  DISCHARGE DIAGNOSIS:  Influenza A Acute Systolic CHF  Severe AS  SECONDARY DIAGNOSIS:   Past Medical History:  Diagnosis Date  . Hypertension     HOSPITAL COURSE:   AlvinWrightis a72 y.o.malewith a known history per below presenting to the emergency room with 2 to 3-day history of worsening shortness of breath, worsening leg swelling, was seen in neuro clinic earlier today found to be hypoxic with O2 saturation in the 80s, placed on oxygen via nasal cannula to the emergency room.  *Acute hypoxic respiratory failure -Suspect due to multifactorial process that includes acute influenza A infection and probable acute new onset congestive heart failure exacerbation and COPD -supplemental oxygen wean as tolerated -lasix 40 mg qd per cardiology -UOP total 7900 cc  *Influenza A infection Tamiflu twice daily  *severe AS -pt to see Dr Angelena Form on feb 26 th for possible TAVR  *Acute Systolic CHF with  history of aortic stenosis,severe Per echo. --Congestive heart failure protocol -strict I&O monitoring, daily weights -diuresed well with IV Lasix  -now changeg to oral Lasix. -cardiology consultation from Dr. Emelia Loron appreciated. -s/p heart catheterization on Thursday, February 20--minimal nonobstructive  CAD.  -pt will qualify for home oxygen  *elevated troponins Most likely secondary to demand ischemia  * benign essential hypertension--relative low BP -currently not on any antihypertensives.(was on amlodipine and  HCTZ/triamterene)  Discussed with Dr. Rockey Situ  D/c home D/w pt and wife CONSULTS OBTAINED:  Treatment Team:  Minna Merritts, MD  DRUG ALLERGIES:   Allergies  Allergen Reactions  . Penicillins Hives    DISCHARGE MEDICATIONS:   Allergies as of 10/14/2018      Reactions   Penicillins Hives      Medication List    STOP taking these medications   amLODipine 5 MG tablet Commonly known as:  NORVASC   triamterene-hydrochlorothiazide 37.5-25 MG tablet Commonly known as:  MAXZIDE-25     TAKE these medications   aspirin 81 MG EC tablet Take 1 tablet (81 mg total) by mouth daily. Start taking on:  October 15, 2018   atorvastatin 20 MG tablet Commonly known as:  LIPITOR Take 1 tablet (20 mg total) by mouth daily at 6 PM.   furosemide 40 MG tablet Commonly known as:  LASIX Take 1 tablet (40 mg total) by mouth daily.   levothyroxine 50 MCG tablet Commonly known as:  SYNTHROID, LEVOTHROID Take 50 mcg by mouth daily.   vitamin B-12 1000 MCG tablet Commonly known as:  CYANOCOBALAMIN Take 1,000 mcg by mouth daily.   VITAMIN D-1000 MAX ST 25 MCG (1000 UT) tablet Generic drug:  Cholecalciferol Take 1,000 Units by mouth daily.            Durable Medical Equipment  (From admission, onward)         Start     Ordered   10/14/18 1142  DME Oxygen  Once    Question Answer Comment  Mode or (Route) Nasal cannula   Liters per Minute 2   Frequency Continuous (stationary and portable oxygen unit needed)   Oxygen conserving  device Yes   Oxygen delivery system Gas      10/14/18 1142          If you experience worsening of your admission symptoms, develop shortness of breath, life threatening emergency, suicidal or homicidal thoughts you must seek medical attention immediately by calling 911 or calling your MD immediately  if symptoms less severe.  You Must read complete instructions/literature along with all the possible adverse reactions/side effects for all the  Medicines you take and that have been prescribed to you. Take any new Medicines after you have completely understood and accept all the possible adverse reactions/side effects.   Please note  You were cared for by a hospitalist during your hospital stay. If you have any questions about your discharge medications or the care you received while you were in the hospital after you are discharged, you can call the unit and asked to speak with the hospitalist on call if the hospitalist that took care of you is not available. Once you are discharged, your primary care physician will handle any further medical issues. Please note that NO REFILLS for any discharge medications will be authorized once you are discharged, as it is imperative that you return to your primary care physician (or establish a relationship with a primary care physician if you do not have one) for your aftercare needs so that they can reassess your need for medications and monitor your lab values. Today   SUBJECTIVE   Doing well  VITAL SIGNS:  Blood pressure 100/62, pulse 86, temperature 98.3 F (36.8 C), temperature source Oral, resp. rate 18, height 6\' 1"  (1.854 m), weight 105.5 kg, SpO2 95 %.  I/O:    Intake/Output Summary (Last 24 hours) at 10/14/2018 1143 Last data filed at 10/14/2018 0845 Gross per 24 hour  Intake 0 ml  Output 2350 ml  Net -2350 ml    PHYSICAL EXAMINATION:  GENERAL:  73 y.o.-year-old patient lying in the bed with no acute distress.  EYES: Pupils equal, round, reactive to light and accommodation. No scleral icterus. Extraocular muscles intact.  HEENT: Head atraumatic, normocephalic. Oropharynx and nasopharynx clear.  NECK:  Supple, no jugular venous distention. No thyroid enlargement, no tenderness.  LUNGS: Normal breath sounds bilaterally, no wheezing, rales,rhonchi or crepitation. No use of accessory muscles of respiration.  CARDIOVASCULAR: S1, S2 normal. No murmurs, rubs, or gallops.  ABDOMEN:  Soft, non-tender, non-distended. Bowel sounds present. No organomegaly or mass.  EXTREMITIES: + pedal edema,no cyanosis, or clubbing.  NEUROLOGIC: Cranial nerves II through XII are intact. Muscle strength 5/5 in all extremities. Sensation intact. Gait not checked.  PSYCHIATRIC: The patient is alert and oriented x 3.  SKIN: No obvious rash, lesion, or ulcer.   DATA REVIEW:   CBC  Recent Labs  Lab 10/13/18 0413  WBC 4.9  HGB 14.2  HCT 44.1  PLT 190    Chemistries  Recent Labs  Lab 10/08/18 1020  10/14/18 0627  NA 126*   < > 129*  K 3.7   < > 3.8  CL 84*   < > 87*  CO2 35*   < > 34*  GLUCOSE 91   < > 113*  BUN 16   < > 15  CREATININE 0.95   < > 0.72  CALCIUM 8.5*   < > 8.2*  AST 20  --   --   ALT 12  --   --   ALKPHOS 50  --   --   BILITOT 1.4*  --   --    < > =  values in this interval not displayed.    Microbiology Results   Recent Results (from the past 240 hour(s))  Culture, blood (Routine x 2)     Status: None   Collection Time: 10/08/18 10:20 AM  Result Value Ref Range Status   Specimen Description BLOOD RIGHT ANTECUBITAL  Final   Special Requests   Final    BOTTLES DRAWN AEROBIC AND ANAEROBIC Blood Culture adequate volume   Culture   Final    NO GROWTH 5 DAYS Performed at [facility], [address] [place], [place] [address]    Report Status 10/13/2018 FINAL  Final  Culture, blood (Routine x 2)     Status: None   Collection Time: 10/08/18 10:23 AM  Result Value Ref Range Status   Specimen Description BLOOD BLOOD RIGHT HAND  Final   Special Requests   Final    BOTTLES DRAWN AEROBIC AND ANAEROBIC Blood Culture adequate volume   Culture   Final    NO GROWTH 5 DAYS Performed at [facility], [address], [place], [place] [address]    Report Status 10/13/2018 FINAL  Final    RADIOLOGY:  No results found.   CODE STATUS:     Code Status Orders  (From admission, onward)         Start     Ordered   10/08/18 1409   Limited resuscitation (code)  Continuous    Question Answer Comment  In the event of cardiac or respiratory ARREST: Initiate Code Blue, Call Rapid Response Yes   In the event of cardiac or respiratory ARREST: Perform CPR Yes   In the event of cardiac or respiratory ARREST: Perform Intubation/Mechanical Ventilation No   In the event of cardiac or respiratory ARREST: Use NIPPV/BiPAp only if indicated Yes   In the event of cardiac or respiratory ARREST: Administer ACLS medications if indicated Yes   In the event of cardiac or respiratory ARREST: Perform Defibrillation or Cardioversion if indicated Yes      10/08/18 1409        Code Status History    Date Active Date Inactive Code Status Order ID Comments User Context   10/08/2018 1408 10/08/2018 1409 Full Code [id number]  SalaryAvel Peace, MD ED    Advance Directive Documentation     Most Recent Value  Type of Advance Directive  Healthcare Power of Attorney, Living will  Pre-existing out of facility DNR order (yellow form or pink MOST form)  -  "MOST" Form in Place?  -      TOTAL TIME TAKING CARE OF THIS PATIENT: *40* minutes.    [name] M.D on 10/14/2018 at 11:43 AM  Between 7am to 6pm - Pager - [phone] After 6pm go to [email or web address] - password [email or web address] [place] [facility]  Office  [phone]  CC: Primary care physician; Center, [facility]

## 2018-10-17 ENCOUNTER — Encounter: Payer: Self-pay | Admitting: Physician Assistant

## 2018-10-18 ENCOUNTER — Other Ambulatory Visit: Payer: Self-pay

## 2018-10-18 DIAGNOSIS — I350 Nonrheumatic aortic (valve) stenosis: Secondary | ICD-10-CM

## 2018-10-18 DIAGNOSIS — R0602 Shortness of breath: Secondary | ICD-10-CM

## 2018-10-19 ENCOUNTER — Encounter: Payer: Self-pay | Admitting: Cardiovascular Disease

## 2018-10-19 ENCOUNTER — Ambulatory Visit (INDEPENDENT_AMBULATORY_CARE_PROVIDER_SITE_OTHER): Payer: Medicare Other | Admitting: Cardiovascular Disease

## 2018-10-19 VITALS — BP 120/70 | HR 82 | Ht 73.0 in | Wt 234.0 lb

## 2018-10-19 DIAGNOSIS — I350 Nonrheumatic aortic (valve) stenosis: Secondary | ICD-10-CM | POA: Diagnosis not present

## 2018-10-19 NOTE — Progress Notes (Signed)
Chief Complaint  Patient presents with  . Follow-up    Severe aortic stenosis    History of Present Illness: 73 yo male with history of chronic diastolic CHF, ? COPD, HTN, hypothyroidism and severe aortic stenosis who is here today as a new consult, referred by Dr. Rockey Situ, for evaluation of his severe aortic stenosis. He was admitted to [facility] February 2020 with dyspnea and lower extremity edema. He was felt to have acute diastolic CHF. He was treated for COPD and influenza and was diuresed. He was seen by Dr. Rockey Situ. Echo 10/10/18 with LVEF=50-55%. Moderate MAC with no regurgitation. The aortic valve is thickened and calcified with limited leaflet mobility. Mean gradient 48 mmHg, peak gradient 53 mmHg. AVA 0.81 cm2. Dimensionless index 0.20. This is consistent with severe aortic stenosis. Cardiac cath 10/13/18 with mild non-obstructive CAD. The aortic valve was not crossed.   He tells me today that his dyspnea has improved since he was diuresed. He describes progressive dyspnea for the last year as well as fatigue. He has had lower extremity edema for the past few months with orthopnea. He has not seen the dentist lately but has no active dental issues. He lives with his wife in [place]. He is a retired from a Clinical cytogeneticist.   Primary Care Physician: [facility], [place]) Primary Cardiologist: Esmond Plants Referring Cardioloigist: Esmond Plants  Past Medical History:  Diagnosis Date  . Chronic diastolic (congestive) heart failure ([place])   . COPD (chronic obstructive pulmonary disease) ([place])   . Hypertension   . Hypothyroidism   . Severe aortic stenosis     Past Surgical History:  Procedure Laterality Date  . RIGHT/LEFT HEART CATH AND CORONARY ANGIOGRAPHY N/A 10/13/2018   Procedure: RIGHT/LEFT HEART CATH AND CORONARY ANGIOGRAPHY;  Surgeon: Nelva Bush, MD;  Location: [place] [facility];  Service: Cardiovascular;  Laterality: N/A;  . VASECTOMY       Current Outpatient Medications  Medication Sig Dispense Refill  . aspirin EC 81 MG EC tablet Take 1 tablet (81 mg total) by mouth daily. 30 tablet 0  . atorvastatin (LIPITOR) 20 MG tablet Take 1 tablet (20 mg total) by mouth daily at 6 PM. 30 tablet 1  . Cholecalciferol (VITAMIN D-1000 MAX ST) 25 MCG (1000 UT) tablet Take 1,000 Units by mouth daily.    . furosemide (LASIX) 40 MG tablet Take 1 tablet (40 mg total) by mouth daily. 30 tablet 11  . levothyroxine (SYNTHROID, LEVOTHROID) 50 MCG tablet Take 50 mcg by mouth daily.    . vitamin B-12 (CYANOCOBALAMIN) 1000 MCG tablet Take 1,000 mcg by mouth daily.     No current facility-administered medications for this visit.     Allergies  Allergen Reactions  . Penicillins Hives    Social History   Socioeconomic History  . Marital status: Married    Spouse name: Not on file  . Number of children: 3  . Years of education: Not on file  . Highest education level: Not on file  Occupational History  . Occupation: Retired-Textiles  Social Needs  . Financial resource strain: Not on file  . Food insecurity:    Worry: Not on file    Inability: Not on file  . Transportation needs:    Medical: Not on file    Non-medical: Not on file  Tobacco Use  . Smoking status: Former Smoker    Packs/day: 1.00    Years: 25.00    Pack years: 25.00  Types: Cigarettes  . Smokeless tobacco: Never Used  Substance and Sexual Activity  . Alcohol use: Never    Frequency: Never  . Drug use: Never  . Sexual activity: Not on file  Lifestyle  . Physical activity:    Days per week: Not on file    Minutes per session: Not on file  . Stress: Not on file  Relationships  . Social connections:    Talks on phone: Not on file    Gets together: Not on file    Attends religious service: Not on file    Active member of club or organization: Not on file    Attends meetings of clubs or organizations: Not on file    Relationship status: Not on file  .  Intimate partner violence:    Fear of current or ex partner: Not on file    Emotionally abused: Not on file    Physically abused: Not on file    Forced sexual activity: Not on file  Other Topics Concern  . Not on file  Social History Narrative  . Not on file    Family History  Problem Relation Age of Onset  . Cancer Father     Review of Systems:  As stated in the HPI and otherwise negative.   BP 120/70   Pulse 82   Ht _0  (1.854 m)   Wt 106.1 kg   SpO2 91%   BMI 30.87 kg/m   Physical Examination: General: Well developed, well nourished, NAD  HEENT: OP clear, mucus membranes moist  SKIN: warm, dry. No rashes. Neuro: No focal deficits  Musculoskeletal: Muscle strength 5/5 all ext  Psychiatric: Mood and affect normal  Neck: No JVD, no carotid bruits, no thyromegaly, no lymphadenopathy.  Lungs:Clear bilaterally, no wheezes, rhonci, crackles Cardiovascular: Regular rate and rhythm. Loud, harsh systolic murmur.  Abdomen:Soft. Bowel sounds present. Non-tender.  Extremities: No lower extremity edema. Pulses are 2 + in the bilateral DP/PT.  Echo 10/10/18:  1. The left ventricle has low normal systolic function, with an ejection fraction of 50-55%. The cavity size was mildly dilated. Left ventricular diastology could not be evaluated. Not able to evaluate wall motion.  2. The right ventricle has normal systolic function. The cavity was normal. There is no increase in right ventricular wall thickness.  3. Left atrial size was mildly dilated.  4. The mitral valve is normal in structure. There is moderate mitral annular calcification present.  5. The tricuspid valve is normal in structure.  6. The aortic valve is tricuspid Severe calcifcation of the aortic valve. severe stenosis of the aortic valve.  7. The pulmonic valve was normal in structure.  8. Difficult study overall. Definity was used.  FINDINGS  Left Ventricle: The left ventricle has low normal systolic function, with  an ejection fraction of 50-55%. The cavity size was mildly dilated. There is no increase in left ventricular wall thickness. Left ventricular diastology could not be evaluated  Definity contrast agent was given IV to delineate the left ventricular endocardial borders. Right Ventricle: The right ventricle has normal systolic function. The cavity was normal. There is no increase in right ventricular wall thickness.     Left Atrium: left atrial size was mildly dilated Right Atrium: right atrial size was normal in size. Right atrial pressure is estimated at 10 mmHg. Right atrial pressure is estimated at 10 mmHg. Interatrial Septum: No atrial level shunt detected by color flow Doppler. Pericardium: There is no evidence of pericardial effusion.  Mitral Valve: The mitral valve is normal in structure. There is moderate mitral annular calcification present. Mitral valve regurgitation is not visualized by color flow Doppler. Tricuspid Valve: The tricuspid valve is normal in structure. Tricuspid valve regurgitation was not visualized by color flow Doppler. Aortic Valve: The aortic valve is tricuspid Severe calcifcation of the aortic valve Aortic valve regurgitation was not visualized by color flow Doppler. There is severe stenosis of the aortic valve, with a calculated valve area of 0.81 cm. Pulmonic Valve: The pulmonic valve was normal in structure. Pulmonic valve regurgitation is not visualized by color flow Doppler. Venous: The inferior vena cava is normal in size with greater than 50% respiratory variability.   LEFT VENTRICLE PLAX 2D (Teich) LV EF:          60.6 %   Diastology LVIDd:          5.30 cm  LV e' lateral:   7.07 cm/s LVIDs:          3.57 cm  LV E/e' lateral: 14.6 LV PW:          1.03 cm  LV e' medial:    5.44 cm/s LV IVS:         0.83 cm  LV E/e' medial:  18.9 LVOT diam:      2.30 cm LV SV:          82 ml LVOT Area:      4.15 cm  LEFT ATRIUM             Index       RIGHT ATRIUM            Index LA diam:        4.60 cm 1.98 cm/m  RA Pressure: 10 mmHg LA Vol (A2C):   50.5 ml 21.77 ml/m RA Area:     21.00 cm LA Vol (A4C):   46.3 ml 19.96 ml/m RA Volume:   57.90 ml  24.96 ml/m LA Biplane Vol: 53.0 ml 22.84 ml/m  AORTIC VALVE                    PULMONIC VALVE AV Area (Vmax):                 PV Vmax:       1.09 m/s AV Area (Vmean):                PV Vmean:      62.500 cm/s AV Area (VTI):                  PV VTI:        0.181 m AV Vmax:           366.67 cm/s  PV Peak grad:  4.8 mmHg AV Vmean:          262.667 cm/s PV Mean grad:  2.0 mmHg AV VTI:            0.778 m AV Peak Grad:      53.8 mmHg AV Mean Grad:      48.0 mmHg LVOT Vmax:         80.50 cm/s LVOT Vmean:        56.200 cm/s LVOT VTI:          0.152 m LVOT/AV VTI ratio: 0.20   AORTA Ao Root diam: 3.30 cm  MITRAL VALVE MV Area (PHT): MV Peak grad:  4.2 mmHg MV Mean grad:  2.0 mmHg MV Vmax:  1.02 m/s MV Vmean:      64.4 cm/s MV VTI:        0.27 m MV PHT:        59.74 msec MV Decel Time: 206 msec MV E velocity: 103.00 cm/s MV A velocity: 85.40 cm/s MV E/A ratio:  1.21  Cardiac cath 10/13/18: 1. Mild to moderate, non-obstructive coronary artery disease. 2. Moderately elevated left heart, right heart, and pulmonary artery pressures. 3. Normal Fick cardiac output/index.  Left Main  Vessel is large.  Dist LM lesion 15% stenosed  Dist LM lesion is 15% stenosed.  Left Anterior Descending  Vessel is large. Vessel is angiographically normal.  Prox LAD lesion 40% stenosed  Prox LAD lesion is 40% stenosed. The lesion is eccentric.  First Diagonal Branch  Vessel is large in size.  Second Diagonal Branch  Vessel is small in size.  Third Diagonal Branch  Vessel is small in size.  Left Circumflex  Vessel is large. The vessel exhibits minimal luminal irregularities.  Mid Cx lesion 30% stenosed  Mid Cx lesion is 30% stenosed.  First Obtuse Marginal Branch  Vessel is moderate in size. The vessel  exhibits minimal luminal irregularities.  Second Obtuse Marginal Branch  Vessel is moderate in size.  Third Obtuse Marginal Branch  Vessel is small in size.  Right Coronary Artery  Vessel is large.  Prox RCA lesion 40% stenosed  Prox RCA lesion is 40% stenosed. The lesion is eccentric.  Dist RCA lesion 30% stenosed  Dist RCA lesion is 30% stenosed.    Diagnostic  Dominance: Right     EKG:  EKG is not ordered today. The ekg ordered today demonstrates   Recent Labs: 10/08/2018: ALT 12; B Natriuretic Peptide 823.0; TSH 0.807 10/13/2018: Hemoglobin 14.2; Platelets 190 10/14/2018: BUN 15; Creatinine, Ser 0.72; Potassium 3.8; Sodium 129   Lipid Panel    Component Value Date/Time   CHOL 117 10/14/2018 0627   TRIG 29 10/14/2018 0627   HDL 46 10/14/2018 0627   CHOLHDL 2.5 10/14/2018 0627   VLDL 6 10/14/2018 0627   LDLCALC 65 10/14/2018 0627     Wt Readings from Last 3 Encounters:  10/19/18 106.1 kg  10/14/18 105.5 kg     Other studies Reviewed: Additional studies/ records that were reviewed today include: Echo images, cath films, hospital records.  Review of the above records demonstrates: severe AS, mild CAD  STS Risk Score:  Risk of Mortality: 1.157%  Renal Failure: 0.752%  Permanent Stroke: 0.861%  Prolonged Ventilation: 5.983%  DSW Infection: 0.147%  Reoperation: 3.632%  Morbidity or Mortality: 9.532%  Short Length of Stay: 49.614%  Long Length of Stay: 3.742%   Assessment and Plan:   1. Severe aortic stenosis: He has severe, stage D aortic valve stenosis. I have personally reviewed the echo images. The aortic valve is thickened, calcified with limited leaflet mobility. I think he would benefit from AVR. He would be a candidate for surgical AVR or TAVR but given his age, would prefer TAVR. I think he may be a good candidate for TAVR.   I have reviewed the natural history of aortic stenosis with the patient and their family members  who are present today. We have  discussed the limitations of medical therapy and the poor prognosis associated with symptomatic aortic stenosis. We have reviewed potential treatment options, including palliative medical therapy, conventional surgical aortic valve replacement, and transcatheter aortic valve replacement. We discussed treatment options in the context of the patient's specific comorbid medical  conditions.   He would like to proceed with planning for TAVR. His cardiac cath is complete. He has mild CAD. Risks and benefits of the TAVR procedure reviewed with the patient and his family today. We will arrange a gated cardiac CT, CTA of the chest/abdomen and pelvis, PFTs, carotid dopplers and PT assessment. I will then refer him to see one of the CT surgeons on our TAVR team.    Current medicines are reviewed at length with the patient today.  The patient does not have concerns regarding medicines.  The following changes have been made:  no change  Labs/ tests ordered today include:  No orders of the defined types were placed in this encounter.    Disposition:   FU with the valve team.    Signed, Lauree Chandler, MD 10/19/2018 2:47 PM    [address], [place], [place]  [address] Phone: [phone]; Fax: [phone]

## 2018-10-25 ENCOUNTER — Telehealth (HOSPITAL_COMMUNITY): Payer: Self-pay | Admitting: Emergency Medicine

## 2018-10-25 NOTE — Telephone Encounter (Signed)
Reaching out to patient to offer assistance regarding upcoming cardiac imaging study; pt verbalizes understanding of appt date/time, parking situation and where to check in, pre-test NPO status and medications ordered, and verified current allergies; name and call back number provided for further questions should they arise Marchia Bond RN Navigator Cardiac Imaging [place] [facility] [phone] office [phone] cell  Per laurens note: check in 7:45a, PFTs at 8a, check in at Radiology 10a, CTs at 10:30a. Pt understands to hold morning dose of furosemide

## 2018-10-27 ENCOUNTER — Ambulatory Visit (HOSPITAL_COMMUNITY)
Admission: RE | Admit: 2018-10-27 | Discharge: 2018-10-27 | Disposition: A | Discharge status: Home / Self Care | Payer: Medicare Other | Source: Ambulatory Visit | Attending: Cardiovascular Disease | Admitting: Cardiovascular Disease

## 2018-10-27 ENCOUNTER — Other Ambulatory Visit (HOSPITAL_COMMUNITY): Payer: Medicare Other

## 2018-10-27 DIAGNOSIS — R0602 Shortness of breath: Secondary | ICD-10-CM

## 2018-10-27 DIAGNOSIS — I350 Nonrheumatic aortic (valve) stenosis: Secondary | ICD-10-CM

## 2018-10-27 LAB — PULMONARY FUNCTION TEST
DL/VA % pred: 98 %
DL/VA: 3.87 ml/min/mmHg/L
DLCO cor % pred: 43 %
DLCO cor: 12.21 ml/min/mmHg
DLCO unc % pred: 43 %
DLCO unc: 12.07 ml/min/mmHg
FEF 25-75 Post: 0.98 L/sec
FEF 25-75 Pre: 0.36 L/sec
FEF2575-%Change-Post: 171 %
FEF2575-%Pred-Post: 37 %
FEF2575-%Pred-Pre: 13 %
FEV1-%Change-Post: 48 %
FEV1-%Pred-Post: 33 %
FEV1-%Pred-Pre: 22 %
FEV1-Post: 1.19 L
FEV1-Pre: 0.8 L
FEV1FVC-%Change-Post: 6 %
FEV1FVC-%PRED-PRE: 71 %
FEV6-%Change-Post: 37 %
FEV6-%Pred-Post: 44 %
FEV6-%Pred-Pre: 32 %
FEV6-POST: 2.02 L
FEV6-Pre: 1.47 L
FEV6FVC-%Change-Post: -1 %
FEV6FVC-%PRED-POST: 100 %
FEV6FVC-%Pred-Pre: 101 %
FVC-%CHANGE-POST: 39 %
FVC-%Pred-Post: 44 %
FVC-%Pred-Pre: 31 %
FVC-Post: 2.13 L
FVC-Pre: 1.53 L
Post FEV1/FVC ratio: 56 %
Post FEV6/FVC ratio: 95 %
Pre FEV1/FVC ratio: 52 %
Pre FEV6/FVC Ratio: 96 %
RV % pred: 137 %
RV: 3.67 L
TLC % PRED: 70 %
TLC: 5.37 L

## 2018-10-27 MED ORDER — ALBUTEROL SULFATE (2.5 MG/3ML) 0.083% IN NEBU
2.5000 mg | INHALATION_SOLUTION | Freq: Once | RESPIRATORY_TRACT | Status: AC
  Administered 2018-10-27: 2.5 mg via RESPIRATORY_TRACT

## 2018-10-27 MED ORDER — IOHEXOL 350 MG/ML SOLN
100.0000 mL | Freq: Once | INTRAVENOUS | Status: AC | PRN
  Administered 2018-10-27: 100 mL via INTRAVENOUS

## 2018-11-09 ENCOUNTER — Institutional Professional Consult (permissible substitution) (INDEPENDENT_AMBULATORY_CARE_PROVIDER_SITE_OTHER): Payer: Medicare Other | Admitting: Surgery

## 2018-11-09 ENCOUNTER — Ambulatory Visit: Payer: Medicare Other | Attending: Cardiovascular Disease | Admitting: Physical Therapy

## 2018-11-09 ENCOUNTER — Encounter: Payer: Self-pay | Admitting: Physical Therapy

## 2018-11-09 ENCOUNTER — Encounter: Payer: Self-pay | Admitting: Surgery

## 2018-11-09 ENCOUNTER — Other Ambulatory Visit: Payer: Self-pay

## 2018-11-09 VITALS — BP 113/70 | HR 74 | Resp 16 | Ht 73.0 in | Wt 229.0 lb

## 2018-11-09 DIAGNOSIS — R2689 Other abnormalities of gait and mobility: Secondary | ICD-10-CM | POA: Diagnosis present

## 2018-11-09 DIAGNOSIS — I350 Nonrheumatic aortic (valve) stenosis: Secondary | ICD-10-CM

## 2018-11-09 NOTE — Progress Notes (Signed)
[name], male   DOB: 01/16/1946, 73 y.o.   MRN: [id number]  [place] [facility] CONSULTATION REPORT  Referring Provider is End, Harrell Gave, MD Primary Cardiologist is No primary care provider on file. PCP is [facility], [facility]  Chief Complaint  Patient presents with   Aortic Stenosis    TAVR eval...all studies completed....Marland KitchenPT EXERCISE EVAL TO BE DONE THIS PM, needs carotid u/s with pre op testing    HPI:  The patient is a 73 year old gentleman with history of hypertension, hypothyroidism, remote smoking until 30 years ago with COPD, severe aortic stenosis and chronic diastolic congestive heart failure who is being evaluated for consideration of transcatheter aortic valve replacement.  The patient has been followed by Dr. Rockey Situ and was admitted to [facility] in February 2020 with shortness of breath and lower extremity edema.  He was felt to have acute diastolic congestive heart failure as well as exacerbation of COPD and tested positive for influenza.  He improved with diuresis and was discharged but said that even when he went home he still had difficulty laying down due to shortness of breath and had a persistent cough which gradually improved.  At the present time he said that he gets short of breath with mild exertion and is fatigued.  He denies any chest pain or pressure.  He has had no dizziness or syncope.  He no longer has orthopnea and can lay flat in bed.  He has been on oxygen since discharge which he uses around-the-clock.  He had an echocardiogram done on 10/10/2018 which showed a trileaflet aortic valve with severe calcification and restricted mobility of the leaflets.  The mean gradient across aortic valve was 48 mmHg with a calculated aortic valve area of 0.8 cm.  Left ventricular ejection fraction was 50 to 55%.  There is no aortic insufficiency and no mitral regurgitation.  Cardiac  catheterization on 10/13/2018 showed mild nonobstructive coronary disease.  PA pressure was moderately elevated at 54/24 with a mean wedge pressure of 25.  Right atrial pressure was 12.  PA saturation was 78% with a cardiac index of 3.0.  The patient is here today with his wife and daughter.  He lives with his wife at home.  He had been fairly active until the past 6 months.  He is retired from [employer] where he worked for many years with significant occupational exposure to [employer]  Past Medical History:  Diagnosis Date   Chronic diastolic (congestive) heart failure (HCC)    COPD (chronic obstructive pulmonary disease) ([place])    Hypertension    Hypothyroidism    Severe aortic stenosis     Past Surgical History:  Procedure Laterality Date   RIGHT/LEFT HEART CATH AND CORONARY ANGIOGRAPHY N/A 10/13/2018   Procedure: RIGHT/LEFT HEART CATH AND CORONARY ANGIOGRAPHY;  Surgeon: Nelva Bush, MD;  Location: [facility] [place] [facility];  Service: Cardiovascular;  Laterality: N/A;   VASECTOMY      Family History  Problem Relation Age of Onset   Cancer Father     Social History   Socioeconomic History   Marital status: Married    Spouse name: Not on file   Number of children: 3   Years of education: Not on file   Highest education level: Not on file  Occupational History   Occupation: Retired-Textiles  Social Needs   Financial resource strain: Not on file   Food insecurity:    Worry:  Not on file    Inability: Not on file   Transportation needs:    Medical: Not on file    Non-medical: Not on file  Tobacco Use   Smoking status: Former Smoker    Packs/day: 1.00    Years: 25.00    Pack years: 25.00    Types: Cigarettes   Smokeless tobacco: Never Used  Substance and Sexual Activity   Alcohol use: Never    Frequency: Never   Drug use: Never   Sexual activity: Not on file  Lifestyle   Physical activity:    Days per week: Not on file    Minutes per  session: Not on file   Stress: Not on file  Relationships   Social connections:    Talks on phone: Not on file    Gets together: Not on file    Attends religious service: Not on file    Active member of club or organization: Not on file    Attends meetings of clubs or organizations: Not on file    Relationship status: Not on file   Intimate partner violence:    Fear of current or ex partner: Not on file    Emotionally abused: Not on file    Physically abused: Not on file    Forced sexual activity: Not on file  Other Topics Concern   Not on file  Social History Narrative   Not on file    Current Outpatient Medications  Medication Sig Dispense Refill   aspirin EC 81 MG EC tablet Take 1 tablet (81 mg total) by mouth daily. 30 tablet 0   atorvastatin (LIPITOR) 20 MG tablet Take 1 tablet (20 mg total) by mouth daily at 6 PM. 30 tablet 1   Cholecalciferol (VITAMIN D-1000 MAX ST) 25 MCG (1000 UT) tablet Take 1,000 Units by mouth daily.     furosemide (LASIX) 40 MG tablet Take 1 tablet (40 mg total) by mouth daily. 30 tablet 11   levothyroxine (SYNTHROID, LEVOTHROID) 50 MCG tablet Take 50 mcg by mouth daily.     vitamin B-12 (CYANOCOBALAMIN) 1000 MCG tablet Take 1,000 mcg by mouth daily.     No current facility-administered medications for this visit.     Allergies  Allergen Reactions   Penicillins Hives      Review of Systems:   General:  Normal appetite, decreased energy, no weight gain, no weight loss, no fever  Cardiac:  no chest pain with exertion, no chest pain at rest, +SOB with mild exertion, no resting SOB, no PND, no orthopnea, no palpitations, no arrhythmia, no atrial fibrillation, + LE edema, no dizzy spells, no syncope  Respiratory:  + exertional shortness of breath, + home oxygen, no productive cough, no dry cough, no bronchitis, no wheezing, no hemoptysis, no asthma, no pain with inspiration or cough, o sleep apnea, no CPAP at night  GI:   no difficulty  swallowing, no reflux, no frequent heartburn, no hiatal hernia, no abdominal pain, no constipation, no diarrhea, no hematochezia, no hematemesis, no melena  GU:   no dysuria,  no frequency, no urinary tract infection, no hematuria, no enlarged prostate, no kidney stones, no kidney disease  Vascular:  no pain suggestive of claudication, no pain in feet, + leg cramps, no varicose veins, no DVT, no non-healing foot ulcer  Neuro:   no stroke, no TIA's, no seizures, no headaches, no temporary blindness one eye,  no slurred speech, no peripheral neuropathy, no chronic pain, no instability of gait,  no memory/cognitive dysfunction  Musculoskeletal: no arthritis, no joint swelling, no myalgias, no difficulty walking, normal mobility   Skin:   no rash, no itching, no skin infections, no pressure sores or ulcerations  Psych:   no anxiety, no depression, no nervousness, no unusual recent stress  Eyes:   no blurry vision, no floaters, no recent vision changes, + wears glasses   ENT:   no hearing loss, no loose or painful teeth, + partial dentures, last saw dentist couple years ago. No pain in teeth or jaw.  Hematologic:  + easy bruising, no abnormal bleeding, no clotting disorder, no frequent epistaxis  Endocrine:  no diabetes, does not check CBG's at home           Physical Exam:   BP 113/70 (BP Location: Right Arm, Patient Position: Sitting, Cuff Size: Large)    Pulse 74    Resp 16    Ht 6\' 1"  (1.854 m)    Wt 229 lb (103.9 kg)    SpO2 94% Comment: 2L O2 continuously   BMI 30.21 kg/m   General:  Elderly but well-appearing with oxygen on  HEENT:  Unremarkable, NCAT, PERLA, EOMI, oropharynx clear, teeth in good condtion  Neck:   no JVD, no bruits, no adenopathy or thyromegaly  Chest:   clear to auscultation, symmetrical breath sounds, no wheezes, no rhonchi, + rales in bases.  CV:   RRR, grade III/VI crescendo/decrescendo murmur heard best at RSB,  no diastolic murmur  Abdomen:  soft, non-tender, no  masses   Extremities:  warm, well-perfused, pulses palpable in feet, mild  LE edema  Rectal/GU  Deferred  Neuro:   Grossly non-focal and symmetrical throughout  Skin:   Clean and dry, no rashes, no breakdown   Diagnostic Tests:  Patient Name:   SANTE BIEDERMANN Date of Exam: 10/10/2018 Medical Rec #:  [id number]      Height:       73.0 in Accession #:    [id number]     Weight:       238.8 lb Date of Birth:  1946-05-18      BSA:          2.32 m Patient Age:    44 years       BP:           99/45 mmHg Patient Gender: M              HR:           81 bpm. Exam Location:  [facility]    Procedure: 2D Echo, Cardiac Doppler, Color Doppler and Intracardiac            Opacification Agent  Indications:     R94.31 Abnormal ECG   History:         Patient has no prior history of Echocardiogram examinations.                  Risk Factors: Hypertension.   Sonographer:     Charmayne Sheer Referring Phys:  [place] Diagnosing Phys: Kathlyn Sacramento MD  IMPRESSIONS    1. The left ventricle has low normal systolic function, with an ejection fraction of 50-55%. The cavity size was mildly dilated. Left ventricular diastology could not be evaluated. Not able to evaluate wall motion.  2. The right ventricle has normal systolic function. The cavity was normal. There is no increase in right ventricular wall thickness.  3. Left atrial size was mildly dilated.  4.  The mitral valve is normal in structure. There is moderate mitral annular calcification present.  5. The tricuspid valve is normal in structure.  6. The aortic valve is tricuspid Severe calcifcation of the aortic valve. severe stenosis of the aortic valve.  7. The pulmonic valve was normal in structure.  8. Difficult study overall. Definity was used.  FINDINGS  Left Ventricle: The left ventricle has low normal systolic function, with an ejection fraction of 50-55%. The cavity size was mildly dilated. There is no increase in left ventricular  wall thickness. Left ventricular diastology could not be evaluated  Definity contrast agent was given IV to delineate the left ventricular endocardial borders. Right Ventricle: The right ventricle has normal systolic function. The cavity was normal. There is no increase in right ventricular wall thickness.     Left Atrium: left atrial size was mildly dilated Right Atrium: right atrial size was normal in size. Right atrial pressure is estimated at 10 mmHg. Right atrial pressure is estimated at 10 mmHg. Interatrial Septum: No atrial level shunt detected by color flow Doppler. Pericardium: There is no evidence of pericardial effusion. Mitral Valve: The mitral valve is normal in structure. There is moderate mitral annular calcification present. Mitral valve regurgitation is not visualized by color flow Doppler. Tricuspid Valve: The tricuspid valve is normal in structure. Tricuspid valve regurgitation was not visualized by color flow Doppler. Aortic Valve: The aortic valve is tricuspid Severe calcifcation of the aortic valve Aortic valve regurgitation was not visualized by color flow Doppler. There is severe stenosis of the aortic valve, with a calculated valve area of 0.81 cm. Pulmonic Valve: The pulmonic valve was normal in structure. Pulmonic valve regurgitation is not visualized by color flow Doppler. Venous: The inferior vena cava is normal in size with greater than 50% respiratory variability.   LEFT VENTRICLE PLAX 2D (Teich) LV EF:          60.6 %   Diastology LVIDd:          5.30 cm  LV e' lateral:   7.07 cm/s LVIDs:          3.57 cm  LV E/e' lateral: 14.6 LV PW:          1.03 cm  LV e' medial:    5.44 cm/s LV IVS:         0.83 cm  LV E/e' medial:  18.9 LVOT diam:      2.30 cm LV SV:          82 ml LVOT Area:      4.15 cm  LEFT ATRIUM             Index       RIGHT ATRIUM           Index LA diam:        4.60 cm 1.98 cm/m  RA Pressure: 10 mmHg LA Vol (A2C):   50.5 ml 21.77 ml/m  RA Area:     21.00 cm LA Vol (A4C):   46.3 ml 19.96 ml/m RA Volume:   57.90 ml  24.96 ml/m LA Biplane Vol: 53.0 ml 22.84 ml/m  AORTIC VALVE                    PULMONIC VALVE AV Area (Vmax):                 PV Vmax:       1.09 m/s AV Area (Vmean):  PV Vmean:      62.500 cm/s AV Area (VTI):                  PV VTI:        0.181 m AV Vmax:           366.67 cm/s  PV Peak grad:  4.8 mmHg AV Vmean:          262.667 cm/s PV Mean grad:  2.0 mmHg AV VTI:            0.778 m AV Peak Grad:      53.8 mmHg AV Mean Grad:      48.0 mmHg LVOT Vmax:         80.50 cm/s LVOT Vmean:        56.200 cm/s LVOT VTI:          0.152 m LVOT/AV VTI ratio: 0.20   AORTA Ao Root diam: 3.30 cm  MITRAL VALVE MV Area (PHT): MV Peak grad:  4.2 mmHg MV Mean grad:  2.0 mmHg MV Vmax:       1.02 m/s MV Vmean:      64.4 cm/s MV VTI:        0.27 m MV PHT:        59.74 msec MV Decel Time: 206 msec MV E velocity: 103.00 cm/s MV A velocity: 85.40 cm/s MV E/A ratio:  1.21    Kathlyn Sacramento MD Electronically signed by Kathlyn Sacramento MD Signature Date/Time: 10/10/2018/6:02:29 PM    Physicians   Panel Physicians Referring Physician Case Authorizing Physician  End, Harrell Gave, MD (Primary)  End, Harrell Gave, MD  Procedures   RIGHT/LEFT HEART CATH AND CORONARY ANGIOGRAPHY  Conclusion   Conclusions: 1. Mild to moderate, non-obstructive coronary artery disease. 2. Moderately elevated left heart, right heart, and pulmonary artery pressures. 3. Normal Fick cardiac output/index.  Recommendations: 1. Medical therapy and risk factor modification to prevent progression of moderate coronary artery disease. 2. Restart diuresis this afternoon; will initiate furosemide 40 mg IV BID. 3. Outpatient TAVR clinic referral for further workup/management of severe aortic stenosis.  Nelva Bush, MD [facility] Pager: [phone]   Recommendations   Antiplatelet/Anticoag Recommend Aspirin  81mg  daily for moderate CAD.  Indications   Acute heart failure with preserved ejection fraction (HFpEF) (HCC) [I50.31 (ICD-10-CM)]  Severe aortic stenosis [I35.0 (ICD-10-CM)]  Procedural Details   Technical Details Indication: 73 y.o. year-old with a hx of smoking for more than 25 years quit age 47, hypertension, chronic leg swelling, hypothyroidism, aortic valve disease, presenting with worsening shortness of breath, leg swelling. He was found to have severe aortic stenosis as well as acute HFpEF. He presents for [facility] for assessment of his heart failure in anticipation of aortic valve intervention.  GFR: >60 ml/min  Procedure: The risks, benefits, complications, treatment options, and expected outcomes were discussed with the patient. The patient and/or family concurred with the proposed plan, giving informed consent. The patient was brought to the cath lab after IV hydration was begun and oral premedication was given. The patient was further sedated with Versed and Fentanyl. The right wrist was assessed with a modified Allens test which was normal. The right wrist was prepped and draped in a sterile fashion. 1% lidocaine was used for local anesthesia. Ultrasound was used to evaluate the right basilic veini. It was patent. A micropuncture needle was used to access the right basilic vein under ultrasound guidance. A 91F slender Glidesheath was inserted using modified Seldinger technique. Right  heart catheterization was performed by advancing a 622F balloon-tipped catheter through the right heart chambers into the pulmonary capillary wedge position. Pressure measurements and oxygen saturations were obtained.  Ultrasound was used to evaluate the right radial artery. It was patent. A micropuncture needle was used to access the right radial artery under ultrasound guidance. Using the modified Seldinger access technique, a 22F slender Glidesheath was placed in the right radial artery. 3 mg Verapamil was given  through the sheath. Heparin 5,000 units were administered. Selective coronary angiography was performed using 622F TIG4.0 catheters to engage the left and right coronary arteries. Left heart catheterization was not performed due to known severe aortic stenosis.  At the end of the procedure, the radial artery sheath was removed and a TR band applied to achieve patent hemostasis. There were no immediate complications. The patient was taken to the recovery area in stable condition.  Contrast used: 55 mL Fluoroscopy time: 6.0 min Radiation dose: 1,246 mGy  Estimated blood loss <50 mL.   During this procedure medications were administered to achieve and maintain moderate conscious sedation while the patient's heart rate, blood pressure, and oxygen saturation were continuously monitored and I was present face-to-face 100% of this time.  Medications  (Filter: Administrations occurring from 10/13/18 0937 to 10/13/18 1034)  Medication Rate/Dose/Volume Action  Date Time   fentaNYL (SUBLIMAZE) injection (mcg) 25 mcg Given 10/13/18 0949   Total dose as of 11/09/18 1631        25 mcg        midazolam (VERSED) injection (mg) 1 mg Given 10/13/18 0949   Total dose as of 11/09/18 1631        1 mg        verapamil (ISOPTIN) injection (mg) 2.5 mg Given 10/13/18 1010   Total dose as of 11/09/18 1631        2.5 mg        heparin injection (Units) 5,000 Units Given 10/13/18 1012   Total dose as of 11/09/18 1631        5,000 Units        iopamidol (ISOVUE-300) 61 % injection (mL) 55 mL Given 10/13/18 1028   Total dose as of 11/09/18 1631        55 mL        Heparin (Porcine) in NaCl 1000-0.9 UT/500ML-% SOLN (mL) 500 mL Given 10/13/18 1028   Total dose as of 11/09/18 1631        500 mL        Heparin (Porcine) in NaCl 1000-0.9 UT/500ML-% SOLN (mL) 500 mL Canceled Entry 10/13/18 1028   Total dose as of 11/09/18 1631        0 mL        aspirin EC tablet 81 mg (mg) *Not included in total Automatically Held  10/13/18 1000   Dosing weight:  108.9 kg        Total dose as of 11/09/18 1631        Cannot be calculated        cholecalciferol (VITAMIN D) tablet 1,000 Units (Units) *Not included in total Automatically Held 10/13/18 1000   Dosing weight:  108.9 kg        Total dose as of 11/09/18 1631        Cannot be calculated        docusate sodium (COLACE) capsule 100 mg (mg) *Not included in total Automatically Held 10/13/18 1000   Dosing weight:  108.9 kg  Total dose as of 11/09/18 1631        Cannot be calculated        guaiFENesin (MUCINEX) 12 hr tablet 600 mg (mg) *Not included in total Automatically Held 10/13/18 1000   Dosing weight:  108.9 kg        Total dose as of 11/09/18 1631        Cannot be calculated        MEDLINE mouth rinse (mL) *Not included in total Automatically Held 10/13/18 1000   Dosing weight:  108.9 kg        Total dose as of 11/09/18 1631        Cannot be calculated        sodium chloride flush (NS) 0.9 % injection 3 mL (mL) *Not included in total Automatically Held 10/13/18 1000   Dosing weight:  108.9 kg        Total dose as of 11/09/18 1631        Cannot be calculated        vitamin B-12 (CYANOCOBALAMIN) tablet 1,000 mcg (mcg) *Not included in total Automatically Held 10/13/18 1000   Dosing weight:  108.9 kg        Total dose as of 11/09/18 1631        Cannot be calculated        Sedation Time   Sedation Time Physician-1: 33 minutes 37 seconds  Complications   Complications documented before study signed (10/13/2018 8:39 PM EST)    No complications were associated with this study.  Documented by Nelva Bush, MD - 10/13/2018 10:57 AM EST    Coronary Findings   Diagnostic  Dominance: Right  Left Main  Vessel is large.  Dist LM lesion 15% stenosed  Dist LM lesion is 15% stenosed.  Left Anterior Descending  Vessel is large. Vessel is angiographically normal.  Prox LAD lesion 40% stenosed  Prox LAD lesion is 40% stenosed. The lesion is  eccentric.  First Diagonal Branch  Vessel is large in size.  Second Diagonal Branch  Vessel is small in size.  Third Diagonal Branch  Vessel is small in size.  Left Circumflex  Vessel is large. The vessel exhibits minimal luminal irregularities.  Mid Cx lesion 30% stenosed  Mid Cx lesion is 30% stenosed.  First Obtuse Marginal Branch  Vessel is moderate in size. The vessel exhibits minimal luminal irregularities.  Second Obtuse Marginal Branch  Vessel is moderate in size.  Third Obtuse Marginal Branch  Vessel is small in size.  Right Coronary Artery  Vessel is large.  Prox RCA lesion 40% stenosed  Prox RCA lesion is 40% stenosed. The lesion is eccentric.  Dist RCA lesion 30% stenosed  Dist RCA lesion is 30% stenosed.  Intervention   No interventions have been documented.  Right Heart   Right Heart Pressures RA (mean): 12 mmHg RV (S/EDP): 54/13 mmHg PA (S/D, mean): 54/24 (37) mmHg PCWP (mean): 25 mmHg  Ao sat: 99% PA sat: 78%  Fick CO: 6.8 L/min Fick CI: 3.0 L/min/m^2  Coronary Diagrams   Diagnostic  Dominance: Right    Intervention   Implants    No implant documentation for this case.  Syngo Images   Show images for CARDIAC CATHETERIZATION  MERGE Images   Show images for CARDIAC CATHETERIZATION   Link to Procedure Log   Procedure Log    Hemo Data (last 7 days) before discharge   AO Systolic Cath Pressure AO Diastolic Cath Pressure AO Mean Cath  Pressure PA Systolic Cath Pressure PA Diastolic Cath Pressure PA Mean Cath Pressure RA Wedge A Wave RA Wedge V Wave RV Systolic Cath Pressure RV Diastolic Cath Pressure RV End Diastolic PCW A Wave PCW V Wave PCW Mean AO O2 Sat PA O2 Sat AO O2 Sat Fick C.O. Fick C.I.  -- -- -- -- -- -- 13 mmHg 11 mmHg -- -- -- 27 mmHg 24 mmHg 21 mmHg -- -- -- 6.81 L/min 2.97 L/min/m2  -- -- -- -- -- -- -- -- 52 mmHg 8 mmHg 13 mmHg -- -- -- -- -- -- -- --  -- -- -- 54 mmHg 24 mmHg 37 mmHg -- -- -- -- -- -- -- -- -- -- -- -- --  --  -- -- -- -- -- -- -- -- -- -- -- -- -- 98.7 % -- SA -- --  -- -- -- -- -- -- -- -- -- -- -- -- -- -- -- MV -- -- --  107 54 mmHg 72 mmHg -- -- -- --                ADDENDUM REPORT: 10/27/2018 11:51  CLINICAL DATA:  Aortic stenosis  EXAM: Cardiac TVR CT  TECHNIQUE: The patient was scanned on a Siemens Force 458 slice scanner. A 120 kV retrospective scan was triggered in the descending thoracic aorta at 111 HU's. Gantry rotation speed was 270 msec's and collimation was .9 mm. No beta blockade or nitro were given. The 3 D data set was reconstructed in 5% intervals of the R-R cycle. Systolic and diastolic phases were analyzed on a dedicated work station using [place], MIP and VT modes. The patient received 80 cc of contrast.  FINDINGS: Aortic Valve: Tri leaflet calcified with restricted leaflet motion  Aorta: Moderate calcific atherosclerosis with normal arch vessels  Semitubular junction: 30 mm  Ascending Thoracic Aorta: 33 mm  Aortic Arch: 27 mm  Descending Thoracic Aorta: 27 mm  Sinus of Valsalva Measurements:  Non-coronary: 34.2 mm  Right - coronary: 33 mm  Left - coronary: 33.5 mm  Coronary Artery Height above Annulus:  Left Main: 14.2 mm above annulus  Right Coronary: 15.6 mm above annulus  Virtual Basal Annulus Measurements:  Maximum/Minimum Diameter: 32.1 mm x 25.6 mm  Perimeter: 94 mm  Area: 622 mm 2  Coronary Arteries: Sufficient height above annulus for deployment  Optimum Fluoroscopic Angle for Delivery: RAO 10 Caudal 32 degrees  IMPRESSION: 1. Tri leaflet AV with annular area 622 mm 2 suitable for a 29 mm Sapiens 3 valve  2. Optimum angiographic angle for deployment RAO 10 Caudal 32 degrees  3.  Normal aortic root size 3.3 cm  4.  Coronary arteries sufficient height above annulus for deployment  Sheila Oats   Electronically Signed   By: Jenkins Rouge M.D.   On: 10/27/2018 11:51   Addended by Josue Hector, MD on 10/27/2018 11:54 AM    Study Result   EXAM: OVER-READ INTERPRETATION  CT CHEST  The following report is an over-read performed by radiologist Dr. Vinnie Langton of [facility], [place] on 10/27/2018. This over-read does not include interpretation of cardiac or coronary anatomy or pathology. The coronary calcium score/coronary CTA interpretation by the cardiologist is attached.  COMPARISON:  None.  FINDINGS: Extracardiac findings will be described separately under dictation for contemporaneously obtained CTA chest, abdomen and pelvis.  IMPRESSION: Please see separate dictation for contemporaneously obtained CTA chest, abdomen and pelvis dated 10/27/2018 for full description of relevant  extracardiac findings.  Electronically Signed: By: Vinnie Langton M.D. On: 10/27/2018 11:37       CLINICAL DATA:  73 year old male with history of severe aortic stenosis. Preprocedural study prior to potential trocar and catheter aortic valve replacement (TAVR) procedure.  EXAM: CT ANGIOGRAPHY CHEST, ABDOMEN AND PELVIS  TECHNIQUE: Multidetector CT imaging through the chest, abdomen and pelvis was performed using the standard protocol during bolus administration of intravenous contrast. Multiplanar reconstructed images and MIPs were obtained and reviewed to evaluate the vascular anatomy.  CONTRAST:  154mL OMNIPAQUE IOHEXOL 350 MG/ML SOLN  COMPARISON:  No priors.  FINDINGS: CTA CHEST FINDINGS  Cardiovascular: Heart size is normal. There is no significant pericardial fluid, thickening or pericardial calcification. There is aortic atherosclerosis, as well as atherosclerosis of the great vessels of the mediastinum and the coronary arteries, including calcified atherosclerotic plaque in the left main, left anterior descending, left circumflex and right coronary arteries. Severe thickening calcification of the aortic valve. Mild calcification of the mitral  annulus.  Mediastinum/Lymph Nodes: No pathologically enlarged mediastinal or hilar lymph nodes. Esophagus is unremarkable in appearance. No axillary lymphadenopathy.  Lungs/Pleura: 6 x 4 mm (mean diameter 5 mm) right middle lobe subpleural nodule (axial image 56 of series 14). No larger more suspicious appearing pulmonary nodules or masses are noted. No acute consolidative airspace disease. No pleural effusions. Mild diffuse bronchial wall thickening with mild centrilobular and paraseptal emphysema. Scattered areas of subsegmental atelectasis or scarring in the lung bases bilaterally.  Musculoskeletal/Soft Tissues: Multiple old healed fractures of the right ribs. There are no aggressive appearing lytic or blastic lesions noted in the visualized portions of the skeleton.  CTA ABDOMEN AND PELVIS FINDINGS  Hepatobiliary: 2 small subcentimeter low-attenuation lesions noted in the left lobe of the liver, too small to characterize, but statistically likely to represent tiny cysts. No larger more suspicious appearing hepatic lesions. No intra or extrahepatic biliary ductal dilatation. Gallbladder is normal in appearance.  Pancreas: No pancreatic mass. No pancreatic ductal dilatation. No pancreatic or peripancreatic fluid or inflammatory changes.  Spleen: Unremarkable.  Adrenals/Urinary Tract: Bilateral kidneys and bilateral adrenal glands are normal in appearance. No hydroureteronephrosis. Urinary bladder is normal in appearance.  Stomach/Bowel: Normal appearance of the stomach. No pathologic dilatation of small bowel or colon. Normal appendix.  Vascular/Lymphatic: Aortic atherosclerosis, without evidence of aneurysm or dissection in the abdominal or pelvic vasculature. Vascular findings and measurements pertinent to potential TAVR procedure, as detailed below. No lymphadenopathy noted in the abdomen or pelvis.  Reproductive: Prostate gland and seminal vesicles are  unremarkable in appearance.  Other: No significant volume of ascites.  No pneumoperitoneum.  Musculoskeletal: There are no aggressive appearing lytic or blastic lesions noted in the visualized portions of the skeleton.  VASCULAR MEASUREMENTS PERTINENT TO TAVR:  AORTA:  Minimal Aortic Diameter-15 x 16 mm  Severity of Aortic Calcification-moderate to severe  RIGHT PELVIS:  Right Common Iliac Artery -  Minimal Diameter-10.6 x 8.3 mm  Tortuosity-mild  Calcification-severe  Right External Iliac Artery -  Minimal Diameter-9.8 x 6.9 mm  Tortuosity-moderate to severe  Calcification-minimal  Right Common Femoral Artery -  Minimal Diameter-9.6 x 6.3 mm  Tortuosity-mild  Calcification-moderate  LEFT PELVIS:  Left Common Iliac Artery -  Minimal Diameter-10.3 x 7.0 mm  Tortuosity-mild  Calcification-severe  Left External Iliac Artery -  Minimal Diameter-9.1 x 8.0 mm  Tortuosity-moderate  Calcification-minimal  Left Common Femoral Artery -  Minimal Diameter-9.3 x 6.6 mm  Tortuosity-mild  Calcification-moderate  Review of the MIP  images confirms the above findings.  IMPRESSION: 1. Vascular findings and measurements pertinent to potential TAVR procedure, as detailed above. 2. Severe thickening calcification of the aortic valve, compatible with the reported clinical history of severe aortic stenosis. 3. Aortic atherosclerosis, in addition to left main and 3 vessel coronary artery disease. 4. 5 mm subpleural nodule in the lateral segment of the right middle lobe, nonspecific, but statistically likely benign. No follow-up needed if patient is low-risk. Non-contrast chest CT can be considered in 12 months if patient is high-risk. This recommendation follows the consensus statement: Guidelines for Management of Incidental Pulmonary Nodules Detected on CT Images: From the Fleischner Society 2017; Radiology 2017;  284:228-243. 5. Additional incidental findings, as above.   Electronically Signed   By: Vinnie Langton M.D.   On: 10/27/2018 14:45   Pulmonary function test  Order: [id number]  Status:  Edited Result - FINAL Visible to patient:  No (Not Released) Next appt:  None Dx:  SOB (shortness of breath); Severe aor...   Ref Range & Units 13d ago  FVC-Pre L 1.53   FVC-%Pred-Pre % 31   FVC-Post L 2.13   FVC-%Pred-Post % 44   FVC-%Change-Post % 39   FEV1-Pre L 0.80   FEV1-%Pred-Pre % 22   FEV1-Post L 1.19   FEV1-%Pred-Post % 33   FEV1-%Change-Post % 48   FEV6-Pre L 1.47   FEV6-%Pred-Pre % 32   FEV6-Post L 2.02   FEV6-%Pred-Post % 44   FEV6-%Change-Post % 37   Pre FEV1/FVC ratio % 52   FEV1FVC-%Pred-Pre % 71   Post FEV1/FVC ratio % 56   FEV1FVC-%Change-Post % 6   Pre FEV6/FVC Ratio % 96   FEV6FVC-%Pred-Pre % 101   Post FEV6/FVC ratio % 95   FEV6FVC-%Pred-Post % 100   FEV6FVC-%Change-Post % -1   FEF 25-75 Pre L/sec 0.36   FEF2575-%Pred-Pre % 13   FEF 25-75 Post L/sec 0.98   FEF2575-%Pred-Post % 37   FEF2575-%Change-Post % 171   RV L 3.67   RV % pred % 137   TLC L 5.37   TLC % pred % 70   DLCO unc ml/min/mmHg 12.07   DLCO unc % pred % 43   DLCO cor ml/min/mmHg 12.21   DLCO cor % pred % 43   DL/VA ml/min/mmHg/L 3.87   DL/VA % pred % 98   Resulting Agency  [facility]      Specimen Collected: 10/27/18 07:26 Last Resulted: 10/27/18 08:40       STS Risk Score: AVR Risk of Mortality: 1.157%  Renal Failure: 0.752%  Permanent Stroke: 0.861%  Prolonged Ventilation: 5.983%  DSW Infection: 0.147%  Reoperation: 3.632%  Morbidity or Mortality: 9.532%  Short Length of Stay: 49.614%  Long Length of Stay: 3.742%   Impression:  This 73 year old gentleman has stage D, severe, symptomatic aortic stenosis with New York Heart Association class III symptoms of exertional fatigue and shortness of breath consistent with chronic diastolic congestive heart failure.  He was recently  admitted last month to [facility] with acute on chronic respiratory failure with hypoxemia at which time he tested positive for influenza but also had some pulmonary edema and congestive heart failure.  He has baseline severe COPD.  I have personally reviewed his 2D echocardiogram, cardiac catheterization, and CTA studies.  His aortic valve is trileaflet with severe calcification of the leaflets and immobility.  The mean gradient across aortic valve was 48 mmHg with a calculated valve area of 0.8 cm.  Peak aortic valve  gradient was 54 mmHg.  Left ventricular ejection fraction was 50 to 55%.  Cardiac catheterization showed mild nonobstructive coronary disease.  Pulmonary artery pressures were moderately elevated with normal cardiac output.  I agree that aortic valve replacement is indicated in this patient with severe aortic stenosis and progressive symptoms as well as a recent admission for congestive heart failure exacerbation and pulmonary edema.  His symptoms were improved with continued aggressive diuresis but he is at high risk for recurrent congestive heart failure.  I think his operative risk for open surgical AVR would be high due to his age with severe obstructive and restrictive lung disease as well as a severe reduction in diffusion capacity on recent PFTs.  I think transcatheter aortic valve replacement be the best treatment for this patient.  His gated cardiac CTA shows anatomy that is suitable for transcatheter aortic valve replacement using a 29 mm Sapien 3 valve with no complicating features.  His abdominal pelvic CTA shows adequate pelvic vascular anatomy to allow transfemoral insertion.  The patient and his wife and daughter were counseled at length regarding treatment alternatives for management of severe symptomatic aortic stenosis. The risks and benefits of surgical intervention has been discussed in detail. Long-term prognosis with medical therapy was discussed.  Alternative approaches such as conventional surgical aortic valve replacement, transcatheter aortic valve replacement, and palliative medical therapy were compared and contrasted at length. This discussion was placed in the context of the patient's own specific clinical presentation and past medical history. All of their questions have been addressed.   Following the decision to proceed with transcatheter aortic valve replacement, a discussion was held regarding what types of management strategies would be attempted intraoperatively in the event of life-threatening complications, including whether or not the patient would be considered a candidate for the use of cardiopulmonary bypass and/or conversion to open sternotomy for attempted surgical intervention. The patient is aware of the fact that transient use of cardiopulmonary bypass may be necessary. I think he is a candidate for sternotomy if needed to manage any intraoperative complications although his risk for postop pulmonary complications would be high.   The patient has been advised of a variety of complications that might develop including but not limited to risks of death, stroke, paravalvular leak, aortic dissection or other major vascular complications, aortic annulus rupture, device embolization, cardiac rupture or perforation, mitral regurgitation, acute myocardial infarction, arrhythmia, heart block or bradycardia requiring permanent pacemaker placement, congestive heart failure, respiratory failure, renal failure, pneumonia, infection, other late complications related to structural valve deterioration or migration, or other complications that might ultimately cause a temporary or permanent loss of functional independence or other long term morbidity. The patient provides full informed consent for the procedure as described and all questions were answered.      Plan:  Transfemoral transcatheter aortic valve replacement on Tuesday,  11/15/2018   I spent 60 minutes performing this consultation and > 50% of this time was spent face to face counseling and coordinating the care of this patient's severe symptomatic aortic stenosis.   Gaye Pollack, MD 11/09/2018

## 2018-11-10 ENCOUNTER — Other Ambulatory Visit: Payer: Self-pay

## 2018-11-10 DIAGNOSIS — I350 Nonrheumatic aortic (valve) stenosis: Secondary | ICD-10-CM

## 2018-11-10 NOTE — Therapy (Signed)
[place], [place], [address] Phone: [phone]   Fax:  [phone]  Physical Therapy Evaluation  Patient Details  Name: USBALDO PANNONE MRN: [id number] Date of Birth: [birth year] Referring Provider (PT): Dr Lauree Chandler   Encounter Date: 11/09/2018    Past Medical History:  Diagnosis Date  . Chronic diastolic (congestive) heart failure ([place])   . COPD (chronic obstructive pulmonary disease) ([place])   . Hypertension   . Hypothyroidism   . Severe aortic stenosis     Past Surgical History:  Procedure Laterality Date  . RIGHT/LEFT HEART CATH AND CORONARY ANGIOGRAPHY N/A 10/13/2018   Procedure: RIGHT/LEFT HEART CATH AND CORONARY ANGIOGRAPHY;  Surgeon: Nelva Bush, MD;  Location: [facility];  Service: Cardiovascular;  Laterality: N/A;  . VASECTOMY      There were no vitals filed for this visit.   Subjective Assessment - 11/09/18 1634    Subjective  {Patient began becoming more short of breath in January. He is now curently on oxygen. He is currenlty on 2 liters of oxygen. He does not use an assitive device.     Currently in Pain?  No/denies   No pain         [facility] PT Assessment - 11/10/18 0001      Assessment   Medical Diagnosis  Severe Aortic Stenois     Referring Provider (PT)  Dr Lauree Chandler    Onset Date/Surgical Date  --   January 2020    Hand Dominance  Right    Next MD Visit  Nothing     Prior Therapy  None       Precautions   Precautions  None    Precaution Comments  2L of Nco2       Restrictions   Weight Bearing Restrictions  No      Balance Screen   Has the patient fallen in the past 6 months  No    Has the patient had a decrease in activity level because of a fear of falling?   No    Is the patient reluctant to leave their home because of a fear of falling?   No      Prior Function   Level of Independence  Independent    Vocation  Retired     [employer]  Was independent with all activity       Cognition   Overall Cognitive Status  Within Functional Limits for tasks assessed    Attention  Focused    Focused Attention  Appears intact    Memory  Appears intact    Awareness  Appears intact    Problem Solving  Appears intact      Observation/Other Assessments   Focus on Therapeutic Outcomes (FOTO)   1x visit       Sensation   Light Touch  Appears Intact    Additional Comments  denies parathesias       Coordination   Gross Motor Movements are Fluid and Coordinated  Yes    Fine Motor Movements are Fluid and Coordinated  Yes      AROM   Overall AROM Comments  full active ROM of UE and LE       Strength   Overall Strength Comments  4+/5 bilateral hip flexion strength. All other UE/LE 5/5/     Right Hand Grip (lbs)  60    Left Hand Grip (lbs)  60      6  Minute Walk- Baseline   6 Minute Walk- Baseline  yes    BP (mmHg)  127/74    HR (bpm)  77    02 Sat (%RA)  95 %   2L of Nco2    Modified Borg Scale for Dyspnea  0- Nothing at all    Perceived Rate of Exertion (Borg)  6-      6 Minute walk- Post Test   6 Minute Walk Post Test  yes    BP (mmHg)  140/75    HR (bpm)  76    02 Sat (%RA)  90 %    Modified Borg Scale for Dyspnea  2- Mild shortness of breath    Perceived Rate of Exertion (Borg)  11- Fairly light       [facility] Pre-Surgical Assessment - 11/10/18 0001    5 Meter Walk Test- trial 1  5 sec    5 Meter Walk Test- trial 2  6 sec.     5 Meter Walk Test- trial 3  5 sec.    5 meter walk test average  5.33 sec    4 Stage Balance Test tolerated for:   10 sec.    4 Stage Balance Test Position  4    Sit To Stand Test- trial 1  5 sec.    Comment  22 sec     ADL/IADL Independent with:  Dressing;Bathing;Meal prep;Finances;Yard work    Economist  460 in 3:20 before requiing 1 1:10 seated rest break then ambualted another 130 66% disability               Objective measurements  completed on examination: See above findings.                           Plan - 11/10/18 1007    Clinical Impression Statement  See below     Stability/Clinical Decision Making  Stable/Uncomplicated    Clinical Decision Making  Low    Rehab Potential  Good    PT Frequency  One time visit      Clinical Impression Statement: Pt is a 73 yo male presenting to OP PT for evaluation prior to possible TAVR surgery due to severe aortic stenosis. Pt reports onset of dyspnea approximately 3 months ago. Symptoms are limiting ability to ambulate without oxygen.Marland Kitchen Pt presents with normal ROM and strength, normal balance and is not at high fall risk 4 stage balance test, decreased walking speed and decreased aerobic endurance per 6 minute walk test. Pt ambulated 460 feet in 3:20 before requesting a seated rest beak lasting 1 min. At time of rest, patient's HR was 76 bpm and O2 was 90% on 2L air. Pt reported 2/10 shortness of breath on modified scale for dyspnea. Pt able to resume after rest and ambulate an additional 260 feet. Pt ambulated a total of 590 feet in 6 minute walk. B/P increased significantly with 6 minute walk test. Based on the Short Physical Performance Battery, patient has a frailty rating of 9/12 with </= 5/12 considered frail.   Patient will benefit from skilled therapeutic intervention in order to improve the following deficits and impairments:     Visit Diagnosis: Other abnormalities of gait and mobility     Problem List Patient Active Problem List   Diagnosis Date Noted  . Acute heart failure with preserved ejection fraction (HFpEF) ([place])   . Severe aortic stenosis   . Respiratory failure ([place])  10/08/2018    Carney Living 11/10/2018, 10:17 AM  [facility] [address] [place], [place], [address] Phone: [phone]   Fax:  [phone]  Name: EUNICE OLDAKER MRN: [id number] Date of Birth: 26-Oct-1945

## 2018-11-10 NOTE — Pre-Procedure Instructions (Signed)
Kenneth Larsen  11/10/2018      [place] [facility]), [place] [address] Kenneth [place]en [address] Phone: [phone] Fax: [phone]    Your procedure is scheduled on Tuesday, March 24th.  Report to [facility] Entrance "A" Admitting at 5:30 A.M.  Call this number if you have problems the morning of surgery:  [phone]   Remember:  Do not eat or drink after midnight.    Take these medicines the morning of surgery with A SIP OF WATER: NONE  As of today, STOP taking any Aleve, Naproxen, Ibuprofen, Motrin, Advil, Goody's, BC's, all herbal medications, fish oil, and all vitamins.    Do not wear jewelry.  Do not wear lotions, powders, or colognes, or deodorant.  Men may shave face and neck.  Do not bring valuables to the hospital.  [facility] is not responsible for any belongings or valuables.  Contacts, dentures or bridgework may not be worn into surgery.  Leave your suitcase in the car.  After surgery it may be brought to your room.  For patients admitted to the hospital, discharge time will be determined by your treatment team.  Patients discharged the day of surgery will not be allowed to drive home.   Special instructions:   [place]- Preparing For Surgery  Before surgery, you can play an important role. Because skin is not sterile, your skin needs to be as free of germs as possible. You can reduce the number of germs on your skin by washing with CHG (chlorahexidine gluconate) Soap before surgery.  CHG is an antiseptic cleaner which kills germs and bonds with the skin to continue killing germs even after washing.    Oral Hygiene is also important to reduce your risk of infection.  Remember - BRUSH YOUR TEETH THE MORNING OF SURGERY WITH YOUR REGULAR TOOTHPASTE  Please do not use if you have an allergy to CHG or antibacterial soaps. If your skin becomes reddened/irritated stop using the CHG.  Do  not shave (including legs and underarms) for at least 48 hours prior to first CHG shower. It is OK to shave your face.  Please follow these instructions carefully.   1. Shower the NIGHT BEFORE SURGERY and the MORNING OF SURGERY with CHG.   2. If you chose to wash your hair, wash your hair first as usual with your normal shampoo.  3. After you shampoo, rinse your hair and body thoroughly to remove the shampoo.  4. Use CHG as you would any other liquid soap. You can apply CHG directly to the skin and wash gently with a scrungie or a clean washcloth.   5. Apply the CHG Soap to your body ONLY FROM THE NECK DOWN.  Do not use on open wounds or open sores. Avoid contact with your eyes, ears, mouth and genitals (private parts). Wash Face and genitals (private parts)  with your normal soap.  6. Wash thoroughly, paying special attention to the area where your surgery will be performed.  7. Thoroughly rinse your body with warm water from the neck down.  8. DO NOT shower/wash with your normal soap after using and rinsing off the CHG Soap.  9. Pat yourself dry with a CLEAN TOWEL.  10. Wear CLEAN PAJAMAS to bed the night before surgery, wear comfortable clothes the morning of surgery  11. Place CLEAN SHEETS on your bed the night of your first shower and DO NOT SLEEP  WITH PETS.    Day of Surgery:  Do not apply any deodorants/lotions.  Please wear clean clothes to the hospital/surgery center.   Remember to brush your teeth WITH YOUR REGULAR TOOTHPASTE.   Please read over the following fact sheets that you were given.

## 2018-11-11 ENCOUNTER — Ambulatory Visit (HOSPITAL_COMMUNITY)
Admission: RE | Admit: 2018-11-11 | Discharge: 2018-11-11 | Disposition: A | Discharge status: Home / Self Care | Payer: Medicare Other | Source: Ambulatory Visit | Attending: Cardiovascular Disease | Admitting: Cardiovascular Disease

## 2018-11-11 ENCOUNTER — Encounter (HOSPITAL_COMMUNITY)
Admission: RE | Admit: 2018-11-11 | Discharge: 2018-11-11 | Disposition: A | Discharge status: Home / Self Care | Payer: Medicare Other | Source: Ambulatory Visit | Attending: Cardiovascular Disease | Admitting: Cardiovascular Disease

## 2018-11-11 ENCOUNTER — Other Ambulatory Visit: Payer: Self-pay

## 2018-11-11 ENCOUNTER — Encounter (HOSPITAL_COMMUNITY): Payer: Self-pay

## 2018-11-11 DIAGNOSIS — I350 Nonrheumatic aortic (valve) stenosis: Secondary | ICD-10-CM

## 2018-11-11 DIAGNOSIS — R918 Other nonspecific abnormal finding of lung field: Secondary | ICD-10-CM | POA: Diagnosis not present

## 2018-11-11 DIAGNOSIS — I6523 Occlusion and stenosis of bilateral carotid arteries: Secondary | ICD-10-CM | POA: Insufficient documentation

## 2018-11-11 DIAGNOSIS — R0602 Shortness of breath: Secondary | ICD-10-CM | POA: Insufficient documentation

## 2018-11-11 HISTORY — DX: Unspecified osteoarthritis, unspecified site: M19.90

## 2018-11-11 HISTORY — DX: Malignant (primary) neoplasm, unspecified: C80.1

## 2018-11-11 LAB — COMPREHENSIVE METABOLIC PANEL
ALT: 12 U/L (ref 0–44)
AST: 15 U/L (ref 15–41)
Albumin: 3.2 g/dL — ABNORMAL LOW (ref 3.5–5.0)
Alkaline Phosphatase: 45 U/L (ref 38–126)
Anion gap: 12 (ref 5–15)
BUN: 12 mg/dL (ref 8–23)
CHLORIDE: 96 mmol/L — AB (ref 98–111)
CO2: 28 mmol/L (ref 22–32)
CREATININE: 0.92 mg/dL (ref 0.61–1.24)
Calcium: 8.5 mg/dL — ABNORMAL LOW (ref 8.9–10.3)
GFR calc Af Amer: >60 mL/min (ref >60)
Glucose, Bld: 89 mg/dL (ref 70–99)
Potassium: 3.5 mmol/L (ref 3.5–5.1)
Sodium: 136 mmol/L (ref 135–145)
Total Bilirubin: 1.3 mg/dL — ABNORMAL HIGH (ref 0.3–1.2)
Total Protein: 5.9 g/dL — ABNORMAL LOW (ref 6.5–8.1)

## 2018-11-11 LAB — CBC
HCT: 41.8 % (ref 39.0–52.0)
Hemoglobin: 13.1 g/dL (ref 13.0–17.0)
MCH: 29.4 pg (ref 26.0–34.0)
MCHC: 31.3 g/dL (ref 30.0–36.0)
MCV: 93.7 fL (ref 80.0–100.0)
Platelets: 204 10*3/uL (ref 150–400)
RBC: 4.46 MIL/uL (ref 4.22–5.81)
RDW: 12.9 % (ref 11.5–15.5)
WBC: 6.1 10*3/uL (ref 4.0–10.5)
nRBC: 0 % (ref 0.0–0.2)

## 2018-11-11 LAB — BLOOD GAS, ARTERIAL
ACID-BASE EXCESS: 11.7 mmol/L — AB (ref 0.0–2.0)
Bicarbonate: 36.7 mmol/L — ABNORMAL HIGH (ref 20.0–28.0)
DRAWN BY: 449841
O2 Saturation: 94.8 %
Patient temperature: 98.6
pCO2 arterial: 56.9 mmHg — ABNORMAL HIGH (ref 32.0–48.0)
pH, Arterial: 7.425 (ref 7.350–7.450)
pO2, Arterial: 69.3 mmHg — ABNORMAL LOW (ref 83.0–108.0)

## 2018-11-11 LAB — URINALYSIS, ROUTINE W REFLEX MICROSCOPIC
Bilirubin Urine: NEGATIVE
Glucose, UA: NEGATIVE mg/dL
Hgb urine dipstick: NEGATIVE
Ketones, ur: NEGATIVE mg/dL
Leukocytes,Ua: NEGATIVE
Nitrite: NEGATIVE
PROTEIN: NEGATIVE mg/dL
Specific Gravity, Urine: 1.01 (ref 1.005–1.030)
pH: 6.5 (ref 5.0–8.0)

## 2018-11-11 LAB — SURGICAL PCR SCREEN
MRSA, PCR: NEGATIVE
Staphylococcus aureus: POSITIVE — AB

## 2018-11-11 LAB — HEMOGLOBIN A1C
HEMOGLOBIN A1C: 5.7 % — AB (ref 4.8–5.6)
Mean Plasma Glucose: 116.89 mg/dL

## 2018-11-11 LAB — PROTIME-INR
INR: 1.1 (ref 0.8–1.2)
Prothrombin Time: 13.9 seconds (ref 11.4–15.2)

## 2018-11-11 LAB — APTT: APTT: 30 s (ref 24–36)

## 2018-11-11 LAB — BRAIN NATRIURETIC PEPTIDE: B Natriuretic Peptide: 324.1 pg/mL — ABNORMAL HIGH (ref 0.0–100.0)

## 2018-11-11 LAB — TYPE AND SCREEN
ABO/RH(D): O POS
Antibody Screen: NEGATIVE

## 2018-11-11 NOTE — Progress Notes (Signed)
Carotid artery duplex has been completed. Preliminary results can be found in CV Proc through chart review.   11/11/18 11:02 AM Kenneth Larsen RVT

## 2018-11-11 NOTE — Progress Notes (Signed)
PCP - [facility]; [facility] Cardiologist - Dr. Harrell Gave End  Chest x-ray - 11/11/18 EKG - 11/11/18 Stress Test - denies ECHO - 10/10/18 Cardiac Cath - 10/13/18  Sleep Study - denies CPAP - denies  Blood Thinner Instructions:N/A Aspirin Instructions:Continue; no dose on DOS.   Anesthesia review: Yes, supplemental oxygen use.   Patient denies shortness of breath, fever, cough and chest pain at PAT appointment   Patient verbalized understanding of instructions that were given to them at the PAT appointment. Patient was also instructed that they will need to review over the PAT instructions again at home before surgery.

## 2018-11-11 NOTE — Anesthesia Preprocedure Evaluation (Addendum)
Anesthesia Evaluation  Patient identified by MRN, date of birth, ID band Patient awake    Reviewed: Allergy & Precautions, NPO status , Patient's Chart, lab work & pertinent test results  Airway Mallampati: III  TM Distance: >3 FB Neck ROM: Full    Dental  (+) Teeth Intact, Dental Advisory Given   Pulmonary COPD, former smoker,    breath sounds clear to auscultation       Cardiovascular hypertension, +CHF  + Valvular Problems/Murmurs AS  Rhythm:Regular Rate:Normal + Systolic murmurs    Neuro/Psych negative neurological ROS  negative psych ROS   GI/Hepatic negative GI ROS, Neg liver ROS,   Endo/Other  Hypothyroidism   Renal/GU negative Renal ROS     Musculoskeletal  (+) Arthritis ,   Abdominal Normal abdominal exam  (+)   Peds  Hematology negative hematology ROS (+)   Anesthesia Other Findings   Reproductive/Obstetrics negative OB ROS                           Echo: 1. The left ventricle has low normal systolic function, with an ejection fraction of 50-55%. The cavity size was mildly dilated. Left ventricular diastology could not be evaluated. Not able to evaluate wall motion.  2. The right ventricle has normal systolic function. The cavity was normal. There is no increase in right ventricular wall thickness.  3. Left atrial size was mildly dilated.  4. The mitral valve is normal in structure. There is moderate mitral annular calcification present.  5. The tricuspid valve is normal in structure.  6. The aortic valve is tricuspid Severe calcifcation of the aortic valve. severe stenosis of the aortic valve.  7. The pulmonic valve was normal in structure.  8. Difficult study overall. Definity was used.  Anesthesia Physical Anesthesia Plan  ASA: IV  Anesthesia Plan: MAC   Post-op Pain Management:    Induction: Intravenous  PONV Risk Score and Plan: 2 and Ondansetron and Treatment may  vary due to age or medical condition  Airway Management Planned: Simple Face Mask and Natural Airway  Additional Equipment: Arterial line  Intra-op Plan:   Post-operative Plan:   Informed Consent: I have reviewed the patients History and Physical, chart, labs and discussed the procedure including the risks, benefits and alternatives for the proposed anesthesia with the patient or authorized representative who has indicated his/her understanding and acceptance.       Plan Discussed with: CRNA  Anesthesia Plan Comments: (Pt recently admitted 2/15-2/21/20 for acute hypoxic respiratory failure secondary to acute influenza A as well as new onset congestive heart failure exacerbation and severe COPD. Symptoms improved significantly with diuresis and supplemental O2. During hospitalization he was also diagnosed with severe AS. Echo  10/10/18 with LVEF=50-55%. Moderate MAC with no regurgitation. The aortic valve is thickened and calcified with limited leaflet mobility. Mean gradient 48 mmHg, peak gradient 53 mmHg. AVA 0.81 cm2. Cath 10/13/18 showed minimal nonobstructive CAD. He was discharged on supplemental O2 2L continuous. At PAT appt he reported feeling well overall, SOB has improved significantly since hospitalization.  PFTs 10/27/18: FVC-%Pred-Pre: 31 FEV1-%Pred-Pre: 22 FEV1FVC-%Pred-Pre: 71 DLCO cor % pred: 43  Precedex, Remi, NE, E gtt's and Vaso bolus.   )     Anesthesia Quick Evaluation

## 2018-11-13 LAB — ABO/RH: ABO/RH(D): O POS

## 2018-11-14 ENCOUNTER — Encounter (HOSPITAL_COMMUNITY): Payer: Self-pay | Admitting: Registered Nurse

## 2018-11-14 MED ORDER — POTASSIUM CHLORIDE 2 MEQ/ML IV SOLN
80.0000 meq | INTRAVENOUS | Status: DC
  Filled 2018-11-14 (×2): qty 40

## 2018-11-14 MED ORDER — LEVOFLOXACIN IN D5W 500 MG/100ML IV SOLN
500.0000 mg | INTRAVENOUS | Status: AC
  Administered 2018-11-15: 500 mg via INTRAVENOUS
  Filled 2018-11-14: qty 100

## 2018-11-14 MED ORDER — VANCOMYCIN HCL 10 G IV SOLR
1500.0000 mg | INTRAVENOUS | Status: AC
  Administered 2018-11-15: 1500 mg via INTRAVENOUS
  Filled 2018-11-14: qty 1500

## 2018-11-14 MED ORDER — DEXMEDETOMIDINE HCL IN NACL 400 MCG/100ML IV SOLN
0.1000 ug/kg/h | INTRAVENOUS | Status: AC
  Administered 2018-11-15: 1 ug/kg/h via INTRAVENOUS
  Filled 2018-11-14: qty 100

## 2018-11-14 MED ORDER — SODIUM CHLORIDE 0.9 % IV SOLN
INTRAVENOUS | Status: DC
  Filled 2018-11-14: qty 30

## 2018-11-14 MED ORDER — MAGNESIUM SULFATE 50 % IJ SOLN
40.0000 meq | INTRAMUSCULAR | Status: DC
  Filled 2018-11-14: qty 9.85

## 2018-11-14 MED ORDER — NOREPINEPHRINE 4 MG/250ML-% IV SOLN
0.0000 ug/min | INTRAVENOUS | Status: DC
  Filled 2018-11-14: qty 250

## 2018-11-14 NOTE — H&P (Signed)
[address]       [place] [address]             [phone]      Cardiothoracic Surgery Admission History and Physical   Referring Provider is End, Harrell Gave, MD Primary Cardiologist is No primary care provider on file. [facility]CP is [facility]      Chief Complaint  Patient presents with   Aortic Stenosis        HPI:  The patient is a 73 year old gentleman with history of hypertension, hypothyroidism, remote smoking until 30 years ago with COPD, severe aortic stenosis and chronic diastolic congestive heart failure who is being evaluated for consideration of transcatheter aortic valve replacement.  The patient has been followed by Dr. Rockey Situ and was admitted to [facility] in February 2020 with shortness of breath and lower extremity edema.  He was felt to have acute diastolic congestive heart failure as well as exacerbation of COPD and tested positive for influenza.  He improved with diuresis and was discharged but said that even when he went home he still had difficulty laying down due to shortness of breath and had a persistent cough which gradually improved.  At the present time he said that he gets short of breath with mild exertion and is fatigued.  He denies any chest pain or pressure.  He has had no dizziness or syncope.  He no longer has orthopnea and can lay flat in bed.  He has been on oxygen since discharge which he uses around-the-clock.  He had an echocardiogram done on 10/10/2018 which showed a trileaflet aortic valve with severe calcification and restricted mobility of the leaflets.  The mean gradient across aortic valve was 48 mmHg with a calculated aortic valve area of 0.8 cm.  Left ventricular ejection fraction was 50 to 55%.  There is no aortic insufficiency and no mitral regurgitation.  Cardiac catheterization on 10/13/2018 showed mild nonobstructive coronary disease.  PA pressure was moderately elevated at 54/24 with a mean wedge  pressure of 25.  Right atrial pressure was 12.  PA saturation was 78% with a cardiac index of 3.0.  The patient is here today with his wife and daughter.  He lives with his wife at home.  He had been fairly active until the past 6 months.  He is retired from [employer] where he worked for many years with significant occupational exposure to [employer]      Past Medical History:  Diagnosis Date   Chronic diastolic (congestive) heart failure (HCC)    COPD (chronic obstructive pulmonary disease) ([place])    Hypertension    Hypothyroidism    Severe aortic stenosis          Past Surgical History:  Procedure Laterality Date   RIGHT/LEFT HEART CATH AND CORONARY ANGIOGRAPHY N/A 10/13/2018   Procedure: RIGHT/LEFT HEART CATH AND CORONARY ANGIOGRAPHY;  Surgeon: Nelva Bush, MD;  Location: [facility];  Service: Cardiovascular;  Laterality: N/A;   VASECTOMY           Family History  Problem Relation Age of Onset   Cancer Father     Social History        Socioeconomic History   Marital status: Married    Spouse name: Not on file   Number of children: 3   Years of education: Not on file   Highest education level: Not on file  Occupational History   Occupation: Retired-Textiles  Social Designer, fashion/clothing strain: Not on file   Food insecurity:    Worry: Not on file    Inability: Not on file   Transportation needs:    Medical: Not on file    Non-medical: Not on file  Tobacco Use   Smoking status: Former Smoker    Packs/day: 1.00    Years: 25.00    Pack years: 25.00    Types: Cigarettes   Smokeless tobacco: Never Used  Substance and Sexual Activity   Alcohol use: Never    Frequency: Never   Drug use: Never   Sexual activity: Not on file  Lifestyle   Physical activity:    Days per week: Not on file    Minutes per session: Not on file   Stress: Not on file  Relationships   Social  connections:    Talks on phone: Not on file    Gets together: Not on file    Attends religious service: Not on file    Active member of club or organization: Not on file    Attends meetings of clubs or organizations: Not on file    Relationship status: Not on file   Intimate partner violence:    Fear of current or ex partner: Not on file    Emotionally abused: Not on file    Physically abused: Not on file    Forced sexual activity: Not on file  Other Topics Concern   Not on file  Social History Narrative   Not on file          Current Outpatient Medications  Medication Sig Dispense Refill   aspirin EC 81 MG EC tablet Take 1 tablet (81 mg total) by mouth daily. 30 tablet 0   atorvastatin (LIPITOR) 20 MG tablet Take 1 tablet (20 mg total) by mouth daily at 6 PM. 30 tablet 1   Cholecalciferol (VITAMIN D-1000 MAX ST) 25 MCG (1000 UT) tablet Take 1,000 Units by mouth daily.     furosemide (LASIX) 40 MG tablet Take 1 tablet (40 mg total) by mouth daily. 30 tablet 11   levothyroxine (SYNTHROID, LEVOTHROID) 50 MCG tablet Take 50 mcg by mouth daily.     vitamin B-12 (CYANOCOBALAMIN) 1000 MCG tablet Take 1,000 mcg by mouth daily.     No current facility-administered medications for this visit.         Allergies  Allergen Reactions   Penicillins Hives      Review of Systems:              General:                      Normal appetite, decreased energy, no weight gain, no weight loss, no fever             Cardiac:                       no chest pain with exertion, no chest pain at rest, +SOB with mild exertion, no resting SOB, no PND, no orthopnea, no palpitations, no arrhythmia, no atrial fibrillation, + LE edema, no dizzy spells, no syncope             Respiratory:                 + exertional shortness of breath, + home oxygen, no productive cough, no dry cough, no bronchitis, no wheezing, no hemoptysis, no asthma, no pain with  inspiration or cough, o sleep apnea, no CPAP at night             GI:                               no difficulty swallowing, no reflux, no frequent heartburn, no hiatal hernia, no abdominal pain, no constipation, no diarrhea, no hematochezia, no hematemesis, no melena             GU:                              no dysuria,  no frequency, no urinary tract infection, no hematuria, no enlarged prostate, no kidney stones, no kidney disease             Vascular:                     no pain suggestive of claudication, no pain in feet, + leg cramps, no varicose veins, no DVT, no non-healing foot ulcer             Neuro:                         no stroke, no TIA's, no seizures, no headaches, no temporary blindness one eye,  no slurred speech, no peripheral neuropathy, no chronic pain, no instability of gait, no memory/cognitive dysfunction             Musculoskeletal:         no arthritis, no joint swelling, no myalgias, no difficulty walking, normal mobility              Skin:                            no rash, no itching, no skin infections, no pressure sores or ulcerations             Psych:                         no anxiety, no depression, no nervousness, no unusual recent stress             Eyes:                           no blurry vision, no floaters, no recent vision changes, + wears glasses              ENT:                            no hearing loss, no loose or painful teeth, + partial dentures, last saw dentist couple years ago. No pain in teeth or jaw.             Hematologic:               + easy bruising, no abnormal bleeding, no clotting disorder, no frequent epistaxis             Endocrine:                   no diabetes, does not check CBG's at home  Physical Exam:              BP 113/70 (BP Location: Right Arm, Patient Position: Sitting, Cuff Size: Large)    Pulse 74    Resp 16    Ht 6\' 1"  (1.854 m)    Wt 229 lb (103.9 kg)     SpO2 94% Comment: 2L O2 continuously   BMI 30.21 kg/m              General:                      Elderly but well-appearing with oxygen on             HEENT:                       Unremarkable, NCAT, PERLA, EOMI, oropharynx clear, teeth in good condtion             Neck:                           no JVD, no bruits, no adenopathy or thyromegaly             Chest:                          clear to auscultation, symmetrical breath sounds, no wheezes, no rhonchi, + rales in bases.             CV:                              RRR, grade III/VI crescendo/decrescendo murmur heard best at RSB,  no diastolic murmur             Abdomen:                    soft, non-tender, no masses              Extremities:                 warm, well-perfused, pulses palpable in feet, mild  LE edema             Rectal/GU                   Deferred             Neuro:                         Grossly non-focal and symmetrical throughout             Skin:                            Clean and dry, no rashes, no breakdown   Diagnostic Tests:  Patient Name: TAHJAE CLAUSING Date of Exam: 10/10/2018 Medical Rec #: [id number] Height: 73.0 in Accession #: [id number] Weight: 238.8 lb Date of Birth: March 04, 1946 BSA: 2.32 m Patient Age: 78 years BP: 99/45 mmHg Patient Gender: M HR: 81 bpm. Exam Location: [facility]   Procedure: 2D Echo, Cardiac Doppler, Color Doppler and Intracardiac Opacification Agent  Indications: R94.31 Abnormal ECG  History: Patient has no prior history of Echocardiogram examinations. Risk Factors: Hypertension.  Sonographer: Charmayne Sheer Referring Phys: Cameron Diagnosing Phys: Kathlyn Sacramento  MD  IMPRESSIONS   1. The left ventricle has low normal systolic function, with an ejection fraction of 50-55%. The cavity size was mildly dilated. Left  ventricular diastology could not be evaluated. Not able to evaluate wall motion. 2. The right ventricle has normal systolic function. The cavity was normal. There is no increase in right ventricular wall thickness. 3. Left atrial size was mildly dilated. 4. The mitral valve is normal in structure. There is moderate mitral annular calcification present. 5. The tricuspid valve is normal in structure. 6. The aortic valve is tricuspid Severe calcifcation of the aortic valve. severe stenosis of the aortic valve. 7. The pulmonic valve was normal in structure. 8. Difficult study overall. Definity was used.  FINDINGS Left Ventricle: The left ventricle has low normal systolic function, with an ejection fraction of 50-55%. The cavity size was mildly dilated. There is no increase in left ventricular wall thickness. Left ventricular diastology could not be evaluated  Definity contrast agent was given IV to delineate the left ventricular endocardial borders. Right Ventricle: The right ventricle has normal systolic function. The cavity was normal. There is no increase in right ventricular wall thickness.    Left Atrium: left atrial size was mildly dilated Right Atrium: right atrial size was normal in size. Right atrial pressure is estimated at 10 mmHg. Right atrial pressure is estimated at 10 mmHg. Interatrial Septum: No atrial level shunt detected by color flow Doppler. Pericardium: There is no evidence of pericardial effusion. Mitral Valve: The mitral valve is normal in structure. There is moderate mitral annular calcification present. Mitral valve regurgitation is not visualized by color flow Doppler. Tricuspid Valve: The tricuspid valve is normal in structure. Tricuspid valve regurgitation was not visualized by color flow Doppler. Aortic Valve: The aortic valve is tricuspid Severe calcifcation of the aortic valve Aortic valve regurgitation was not visualized by color flow Doppler. There is  severe stenosis of the aortic valve, with a calculated valve area of 0.81 cm. Pulmonic Valve: The pulmonic valve was normal in structure. Pulmonic valve regurgitation is not visualized by color flow Doppler. Venous: The inferior vena cava is normal in size with greater than 50% respiratory variability.  LEFT VENTRICLE PLAX 2D (Teich) LV EF: 60.6 % Diastology LVIDd: 5.30 cm LV e' lateral: 7.07 cm/s LVIDs: 3.57 cm LV E/e' lateral: 14.6 LV PW: 1.03 cm LV e' medial: 5.44 cm/s LV IVS: 0.83 cm LV E/e' medial: 18.9 LVOT diam: 2.30 cm LV SV: 82 ml LVOT Area: 4.15 cm  LEFT ATRIUM Index RIGHT ATRIUM Index LA diam: 4.60 cm 1.98 cm/m RA Pressure: 10 mmHg LA Vol (A2C): 50.5 ml 21.77 ml/m RA Area: 21.00 cm LA Vol (A4C): 46.3 ml 19.96 ml/m RA Volume: 57.90 ml 24.96 ml/m LA Biplane Vol: 53.0 ml 22.84 ml/m AORTIC VALVE PULMONIC VALVE AV Area (Vmax): PV Vmax: 1.09 m/s AV Area (Vmean): PV Vmean: 62.500 cm/s AV Area (VTI): PV VTI: 0.181 m AV Vmax: 366.67 cm/s PV Peak grad: 4.8 mmHg AV Vmean: 262.667 cm/s PV Mean grad: 2.0 mmHg AV VTI: 0.778 m AV Peak Grad: 53.8 mmHg AV Mean Grad: 48.0 mmHg LVOT Vmax: 80.50 cm/s LVOT Vmean: 56.200 cm/s LVOT VTI: 0.152 m LVOT/AV VTI ratio: 0.20  AORTA Ao Root diam: 3.30 cm  MITRAL VALVE MV Area (PHT): MV Peak grad: 4.2 mmHg MV Mean grad: 2.0 mmHg MV Vmax: 1.02 m/s MV Vmean: 64.4 cm/s MV VTI: 0.27 m MV PHT: 59.74 msec MV Decel Time: 206 msec MV E velocity: 103.00 cm/s MV  A velocity: 85.40 cm/s MV E/A ratio: 1.21   Kathlyn Sacramento MD Electronically signed by Kathlyn Sacramento MD Signature Date/Time: 10/10/2018/6:02:29 PM   Physicians    Panel Physicians Referring Physician Case Authorizing Physician  End, Harrell Gave, MD (Primary)  End, Harrell Gave, MD  Procedures   RIGHT/LEFT HEART CATH AND CORONARY ANGIOGRAPHY  Conclusion   Conclusions: 1. Mild to moderate, non-obstructive coronary artery disease. 2. Moderately elevated left heart, right heart, and pulmonary artery pressures. 3. Normal Fick cardiac output/index.  Recommendations: 1. Medical therapy and risk factor modification to prevent progression of moderate coronary artery disease. 2. Restart diuresis this afternoon; will initiate furosemide 40 mg IV BID. 3. Outpatient TAVR clinic referral for further workup/management of severe aortic stenosis.  Nelva Bush, MD [facility] Pager: [phone]   Recommendations   Antiplatelet/Anticoag Recommend Aspirin 81mg  daily for moderate CAD.  Indications   Acute heart failure with preserved ejection fraction (HFpEF) (HCC) [I50.31 (ICD-10-CM)]  Severe aortic stenosis [I35.0 (ICD-10-CM)]  Procedural Details   Technical Details Indication: 73 y.o. year-old with a hx of smoking for more than 25 years quit age 51, hypertension, chronic leg swelling, hypothyroidism, aortic valve disease, presenting with worsening shortness of breath, leg swelling. He was found to have severe aortic stenosis as well as acute HFpEF. He presents for [facility] for assessment of his heart failure in anticipation of aortic valve intervention.  GFR: >60 ml/min  Procedure: The risks, benefits, complications, treatment options, and expected outcomes were discussed with the patient. The patient and/or family concurred with the proposed plan, giving informed consent. The patient was brought to the cath lab after IV hydration was begun and oral premedication was given. The patient was further sedated with Versed and Fentanyl. The right wrist was assessed with a modified Allens test which was normal. The right wrist was prepped and  draped in a sterile fashion. 1% lidocaine was used for local anesthesia. Ultrasound was used to evaluate the right basilic veini. It was patent. A micropuncture needle was used to access the right basilic vein under ultrasound guidance. A 34F slender Glidesheath was inserted using modified Seldinger technique. Right heart catheterization was performed by advancing a 34F balloon-tipped catheter through the right heart chambers into the pulmonary capillary wedge position. Pressure measurements and oxygen saturations were obtained.  Ultrasound was used to evaluate the right radial artery. It was patent. A micropuncture needle was used to access the right radial artery under ultrasound guidance. Using the modified Seldinger access technique, a 42F slender Glidesheath was placed in the right radial artery. 3 mg Verapamil was given through the sheath. Heparin 5,000 units were administered. Selective coronary angiography was performed using 34F TIG4.0 catheters to engage the left and right coronary arteries. Left heart catheterization was not performed due to known severe aortic stenosis.  At the end of the procedure, the radial artery sheath was removed and a TR band applied to achieve patent hemostasis. There were no immediate complications. The patient was taken to the recovery area in stable condition.  Contrast used: 55 mL Fluoroscopy time: 6.0 min Radiation dose: 1,246 mGy  Estimated blood loss <50 mL.   During this procedure medications were administered to achieve and maintain moderate conscious sedation while the patient's heart rate, blood pressure, and oxygen saturation were continuously monitored and I was present face-to-face 100% of this time.  Medications  (Filter: Administrations occurring from 10/13/18 0937 to 10/13/18 1034)          Medication Rate/Dose/Volume Action  Date Time  fentaNYL (SUBLIMAZE) injection (mcg) 25 mcg Given 10/13/18 0949   Total dose as of 11/09/18 1631         25 mcg        midazolam (VERSED) injection (mg) 1 mg Given 10/13/18 0949   Total dose as of 11/09/18 1631        1 mg        verapamil (ISOPTIN) injection (mg) 2.5 mg Given 10/13/18 1010   Total dose as of 11/09/18 1631        2.5 mg        heparin injection (Units) 5,000 Units Given 10/13/18 1012   Total dose as of 11/09/18 1631        5,000 Units        iopamidol (ISOVUE-300) 61 % injection (mL) 55 mL Given 10/13/18 1028   Total dose as of 11/09/18 1631        55 mL        Heparin (Porcine) in NaCl 1000-0.9 UT/500ML-% SOLN (mL) 500 mL Given 10/13/18 1028   Total dose as of 11/09/18 1631        500 mL        Heparin (Porcine) in NaCl 1000-0.9 UT/500ML-% SOLN (mL) 500 mL Canceled Entry 10/13/18 1028   Total dose as of 11/09/18 1631        0 mL        aspirin EC tablet 81 mg (mg) *Not included in total Automatically Held 10/13/18 1000   Dosing weight:  108.9 kg        Total dose as of 11/09/18 1631        Cannot be calculated        cholecalciferol (VITAMIN D) tablet 1,000 Units (Units) *Not included in total Automatically Held 10/13/18 1000   Dosing weight:  108.9 kg        Total dose as of 11/09/18 1631        Cannot be calculated        docusate sodium (COLACE) capsule 100 mg (mg) *Not included in total Automatically Held 10/13/18 1000   Dosing weight:  108.9 kg        Total dose as of 11/09/18 1631        Cannot be calculated        guaiFENesin (MUCINEX) 12 hr tablet 600 mg (mg) *Not included in total Automatically Held 10/13/18 1000   Dosing weight:  108.9 kg        Total dose as of 11/09/18 1631        Cannot be calculated        MEDLINE mouth rinse (mL) *Not included in total Automatically Held 10/13/18 1000   Dosing weight:  108.9 kg        Total dose as of 11/09/18 1631        Cannot be calculated         sodium chloride flush (NS) 0.9 % injection 3 mL (mL) *Not included in total Automatically Held 10/13/18 1000   Dosing weight:  108.9 kg        Total dose as of 11/09/18 1631        Cannot be calculated        vitamin B-12 (CYANOCOBALAMIN) tablet 1,000 mcg (mcg) *Not included in total Automatically Held 10/13/18 1000   Dosing weight:  108.9 kg        Total dose as of 11/09/18 1631        Cannot be calculated  Sedation Time   Sedation Time Physician-1: 33 minutes 37 seconds  Complications   Complications documented before study signed (10/13/2018 8:39 PM EST)    No complications were associated with this study.  Documented by Nelva Bush, MD - 10/13/2018 10:57 AM EST    Coronary Findings   Diagnostic  Dominance: Right  Left Main  Vessel is large.  Dist LM lesion 15% stenosed  Dist LM lesion is 15% stenosed.  Left Anterior Descending  Vessel is large. Vessel is angiographically normal.  Prox LAD lesion 40% stenosed  Prox LAD lesion is 40% stenosed. The lesion is eccentric.  First Diagonal Branch  Vessel is large in size.  Second Diagonal Branch  Vessel is small in size.  Third Diagonal Branch  Vessel is small in size.  Left Circumflex  Vessel is large. The vessel exhibits minimal luminal irregularities.  Mid Cx lesion 30% stenosed  Mid Cx lesion is 30% stenosed.  First Obtuse Marginal Branch  Vessel is moderate in size. The vessel exhibits minimal luminal irregularities.  Second Obtuse Marginal Branch  Vessel is moderate in size.  Third Obtuse Marginal Branch  Vessel is small in size.  Right Coronary Artery  Vessel is large.  Prox RCA lesion 40% stenosed  Prox RCA lesion is 40% stenosed. The lesion is eccentric.  Dist RCA lesion 30% stenosed  Dist RCA lesion is 30% stenosed.  Intervention   No interventions have been documented.  Right Heart   Right Heart Pressures RA (mean): 12 mmHg RV (S/EDP):  54/13 mmHg PA (S/D, mean): 54/24 (37) mmHg PCWP (mean): 25 mmHg  Ao sat: 99% PA sat: 78%  Fick CO: 6.8 L/min Fick CI: 3.0 L/min/m^2  Coronary Diagrams   Diagnostic  Dominance: Right    Intervention   Implants    No implant documentation for this case.  Syngo Images      Show images for CARDIAC CATHETERIZATION  MERGE Images   Show images for CARDIAC CATHETERIZATION   Link to Procedure Log   Procedure Log    Hemo Data (last 7 days) before discharge   AO Systolic Cath Pressure AO Diastolic Cath Pressure AO Mean Cath Pressure PA Systolic Cath Pressure PA Diastolic Cath Pressure PA Mean Cath Pressure RA Wedge A Wave RA Wedge V Wave RV Systolic Cath Pressure RV Diastolic Cath Pressure RV End Diastolic PCW A Wave PCW V Wave PCW Mean AO O2 Sat PA O2 Sat AO O2 Sat Fick C.O. Fick C.I.  -- -- -- -- -- -- 13 mmHg 11 mmHg -- -- -- 27 mmHg 24 mmHg 21 mmHg -- -- -- 6.81 L/min 2.97 L/min/m2  -- -- -- -- -- -- -- -- 52 mmHg 8 mmHg 13 mmHg -- -- -- -- -- -- -- --  -- -- -- 54 mmHg 24 mmHg 37 mmHg -- -- -- -- -- -- -- -- -- -- -- -- --  -- -- -- -- -- -- -- -- -- -- -- -- -- -- 98.7 % -- SA -- --  -- -- -- -- -- -- -- -- -- -- -- -- -- -- -- MV -- -- --  107 54 mmHg 72 mmHg -- -- -- --                ADDENDUM REPORT: 10/27/2018 11:51  CLINICAL DATA: Aortic stenosis  EXAM: Cardiac TVR CT  TECHNIQUE: The patient was scanned on a Siemens Force 283 slice scanner. A 120 kV retrospective scan was triggered in the descending  thoracic aorta at 111 HU's. Gantry rotation speed was 270 msec's and collimation was .9 mm. No beta blockade or nitro were given. The 3 D data set was reconstructed in 5% intervals of the R-R cycle. Systolic and diastolic phases were analyzed on a dedicated work station using [place]ney, MIP and VT modes. The patient received 80 cc of contrast.  FINDINGS: Aortic Valve: Tri leaflet calcified with restricted leaflet motion  Aorta: Moderate  calcific atherosclerosis with normal arch vessels  Semitubular junction: 30 mm  Ascending Thoracic Aorta: 33 mm  Aortic Arch: 27 mm  Descending Thoracic Aorta: 27 mm  Sinus of Valsalva Measurements:  Non-coronary: 34.2 mm  Right - coronary: 33 mm  Left - coronary: 33.5 mm  Coronary Artery Height above Annulus:  Left Main: 14.2 mm above annulus  Right Coronary: 15.6 mm above annulus  Virtual Basal Annulus Measurements:  Maximum/Minimum Diameter: 32.1 mm x 25.6 mm  Perimeter: 94 mm  Area: 622 mm 2  Coronary Arteries: Sufficient height above annulus for deployment  Optimum Fluoroscopic Angle for Delivery: RAO 10 Caudal 32 degrees  IMPRESSION: 1. Tri leaflet AV with annular area 622 mm 2 suitable for a 29 mm Sapiens 3 valve  2. Optimum angiographic angle for deployment RAO 10 Caudal 32 degrees  3. Normal aortic root size 3.3 cm  4. Coronary arteries sufficient height above annulus for deployment  Sheila Oats   Electronically Signed By: Jenkins Rouge M.D. On: 10/27/2018 11:51   Addended by Josue Hector, MD on 10/27/2018 11:54 AM    Study Result   EXAM: OVER-READ INTERPRETATION CT CHEST  The following report is an over-read performed by radiologist Dr. Vinnie Langton of [facility], [place] on 10/27/2018. This over-read does not include interpretation of cardiac or coronary anatomy or pathology. The coronary calcium score/coronary CTA interpretation by the cardiologist is attached.  COMPARISON: None.  FINDINGS: Extracardiac findings will be described separately under dictation for contemporaneously obtained CTA chest, abdomen and pelvis.  IMPRESSION: Please see separate dictation for contemporaneously obtained CTA chest, abdomen and pelvis dated 10/27/2018 for full description of relevant extracardiac findings.  Electronically Signed: By: Vinnie Langton M.D. On: 10/27/2018 11:37        CLINICAL DATA: 73 year old male with history of severe aortic stenosis. Preprocedural study prior to potential trocar and catheter aortic valve replacement (TAVR) procedure.  EXAM: CT ANGIOGRAPHY CHEST, ABDOMEN AND PELVIS  TECHNIQUE: Multidetector CT imaging through the chest, abdomen and pelvis was performed using the standard protocol during bolus administration of intravenous contrast. Multiplanar reconstructed images and MIPs were obtained and reviewed to evaluate the vascular anatomy.  CONTRAST: 143mL OMNIPAQUE IOHEXOL 350 MG/ML SOLN  COMPARISON: No priors.  FINDINGS: CTA CHEST FINDINGS  Cardiovascular: Heart size is normal. There is no significant pericardial fluid, thickening or pericardial calcification. There is aortic atherosclerosis, as well as atherosclerosis of the great vessels of the mediastinum and the coronary arteries, including calcified atherosclerotic plaque in the left main, left anterior descending, left circumflex and right coronary arteries. Severe thickening calcification of the aortic valve. Mild calcification of the mitral annulus.  Mediastinum/Lymph Nodes: No pathologically enlarged mediastinal or hilar lymph nodes. Esophagus is unremarkable in appearance. No axillary lymphadenopathy.  Lungs/Pleura: 6 x 4 mm (mean diameter 5 mm) right middle lobe subpleural nodule (axial image 56 of series 14). No larger more suspicious appearing pulmonary nodules or masses are noted. No acute consolidative airspace disease. No pleural effusions. Mild diffuse bronchial wall thickening with mild centrilobular and paraseptal emphysema. Scattered  areas of subsegmental atelectasis or scarring in the lung bases bilaterally.  Musculoskeletal/Soft Tissues: Multiple old healed fractures of the right ribs. There are no aggressive appearing lytic or blastic lesions noted in the visualized portions of the skeleton.  CTA ABDOMEN AND PELVIS  FINDINGS  Hepatobiliary: 2 small subcentimeter low-attenuation lesions noted in the left lobe of the liver, too small to characterize, but statistically likely to represent tiny cysts. No larger more suspicious appearing hepatic lesions. No intra or extrahepatic biliary ductal dilatation. Gallbladder is normal in appearance.  Pancreas: No pancreatic mass. No pancreatic ductal dilatation. No pancreatic or peripancreatic fluid or inflammatory changes.  Spleen: Unremarkable.  Adrenals/Urinary Tract: Bilateral kidneys and bilateral adrenal glands are normal in appearance. No hydroureteronephrosis. Urinary bladder is normal in appearance.  Stomach/Bowel: Normal appearance of the stomach. No pathologic dilatation of small bowel or colon. Normal appendix.  Vascular/Lymphatic: Aortic atherosclerosis, without evidence of aneurysm or dissection in the abdominal or pelvic vasculature. Vascular findings and measurements pertinent to potential TAVR procedure, as detailed below. No lymphadenopathy noted in the abdomen or pelvis.  Reproductive: Prostate gland and seminal vesicles are unremarkable in appearance.  Other: No significant volume of ascites. No pneumoperitoneum.  Musculoskeletal: There are no aggressive appearing lytic or blastic lesions noted in the visualized portions of the skeleton.  VASCULAR MEASUREMENTS PERTINENT TO TAVR:  AORTA:  Minimal Aortic Diameter-15 x 16 mm  Severity of Aortic Calcification-moderate to severe  RIGHT PELVIS:  Right Common Iliac Artery -  Minimal Diameter-10.6 x 8.3 mm  Tortuosity-mild  Calcification-severe  Right External Iliac Artery -  Minimal Diameter-9.8 x 6.9 mm  Tortuosity-moderate to severe  Calcification-minimal  Right Common Femoral Artery -  Minimal Diameter-9.6 x 6.3 mm  Tortuosity-mild  Calcification-moderate  LEFT PELVIS:  Left Common Iliac Artery -  Minimal Diameter-10.3 x 7.0  mm  Tortuosity-mild  Calcification-severe  Left External Iliac Artery -  Minimal Diameter-9.1 x 8.0 mm  Tortuosity-moderate  Calcification-minimal  Left Common Femoral Artery -  Minimal Diameter-9.3 x 6.6 mm  Tortuosity-mild  Calcification-moderate  Review of the MIP images confirms the above findings.  IMPRESSION: 1. Vascular findings and measurements pertinent to potential TAVR procedure, as detailed above. 2. Severe thickening calcification of the aortic valve, compatible with the reported clinical history of severe aortic stenosis. 3. Aortic atherosclerosis, in addition to left main and 3 vessel coronary artery disease. 4. 5 mm subpleural nodule in the lateral segment of the right middle lobe, nonspecific, but statistically likely benign. No follow-up needed if patient is low-risk. Non-contrast chest CT can be considered in 12 months if patient is high-risk. This recommendation follows the consensus statement: Guidelines for Management of Incidental Pulmonary Nodules Detected on CT Images: From the Fleischner Society 2017; Radiology 2017; 284:228-243. 5. Additional incidental findings, as above.   Electronically Signed By: Vinnie Langton M.D. On: 10/27/2018 14:45   Pulmonary function test  Order: [id number]  Status:  Edited Result - FINAL Visible to patient:  No (Not Released) Next appt:  None Dx:  SOB (shortness of breath); Severe aor...   Ref Range & Units 13d ago  FVC-Pre L 1.53   FVC-%Pred-Pre % 31   FVC-Post L 2.13   FVC-%Pred-Post % 44   FVC-%Change-Post % 39   FEV1-Pre L 0.80   FEV1-%Pred-Pre % 22   FEV1-Post L 1.19   FEV1-%Pred-Post % 33   FEV1-%Change-Post % 48   FEV6-Pre L 1.47   FEV6-%Pred-Pre % 32   FEV6-Post L 2.02   FEV6-%Pred-Post % 44  FEV6-%Change-Post % 37   Pre FEV1/FVC ratio % 52   FEV1FVC-%Pred-Pre % 71   Post FEV1/FVC ratio % 56   FEV1FVC-%Change-Post % 6   Pre FEV6/FVC Ratio % 96    FEV6FVC-%Pred-Pre % 101   Post FEV6/FVC ratio % 95   FEV6FVC-%Pred-Post % 100   FEV6FVC-%Change-Post % -1   FEF 25-75 Pre L/sec 0.36   FEF2575-%Pred-Pre % 13   FEF 25-75 Post L/sec 0.98   FEF2575-%Pred-Post % 37   FEF2575-%Change-Post % 171   RV L 3.67   RV % pred % 137   TLC L 5.37   TLC % pred % 70   DLCO unc ml/min/mmHg 12.07   DLCO unc % pred % 43   DLCO cor ml/min/mmHg 12.21   DLCO cor % pred % 43   DL/VA ml/min/mmHg/L 3.87   DL/VA % pred % 98   Resulting Agency  [facility]      Specimen Collected: 10/27/18 07:26 Last Resulted: 10/27/18 08:40       STS Risk Score:AVR Risk of Mortality: 1.157%  Renal Failure: 0.752%  Permanent Stroke: 0.861%  Prolonged Ventilation: 5.983%  DSW Infection: 0.147%  Reoperation: 3.632%  Morbidity or Mortality: 9.532%  Short Length of Stay: 49.614%  Long Length of Stay: 3.742%   Impression:  This 73 year old gentleman has stage D, severe, symptomatic aortic stenosis with New York Heart Association class III symptoms of exertional fatigue and shortness of breath consistent with chronic diastolic congestive heart failure.  He was recently admitted last month to [facility] with acute on chronic respiratory failure with hypoxemia at which time he tested positive for influenza but also had some pulmonary edema and congestive heart failure.  He has baseline severe COPD.  I have personally reviewed his 2D echocardiogram, cardiac catheterization, and CTA studies.  His aortic valve is trileaflet with severe calcification of the leaflets and immobility.  The mean gradient across aortic valve was 48 mmHg with a calculated valve area of 0.8 cm.  Peak aortic valve gradient was 54 mmHg.  Left ventricular ejection fraction was 50 to 55%.  Cardiac catheterization showed mild nonobstructive coronary disease.  Pulmonary artery pressures were moderately elevated with normal cardiac output.  I agree that aortic valve replacement is  indicated in this patient with severe aortic stenosis and progressive symptoms as well as a recent admission for congestive heart failure exacerbation and pulmonary edema.  His symptoms were improved with continued aggressive diuresis but he is at high risk for recurrent congestive heart failure.  I think his operative risk for open surgical AVR would be high due to his age with severe obstructive and restrictive lung disease as well as a severe reduction in diffusion capacity on recent PFTs.  I think transcatheter aortic valve replacement be the best treatment for this patient.  His gated cardiac CTA shows anatomy that is suitable for transcatheter aortic valve replacement using a 29 mm Sapien 3 valve with no complicating features.  His abdominal pelvic CTA shows adequate pelvic vascular anatomy to allow transfemoral insertion.  The patient and his wife and daughter were counseled at length regarding treatment alternatives for management of severe symptomatic aortic stenosis. The risks and benefits of surgical intervention has been discussed in detail. Long-term prognosis with medical therapy was discussed. Alternative approaches such as conventional surgical aortic valve replacement, transcatheter aortic valve replacement, and palliative medical therapy were compared and contrasted at length. This discussion was placed in the context of the patient's own specific clinical  presentation and past medical history. All of their questions have been addressed.   Following the decision to proceed with transcatheter aortic valve replacement, a discussion was held regarding what types of management strategies would be attempted intraoperatively in the event of life-threatening complications, including whether or not the patient would be considered a candidate for the use of cardiopulmonary bypass and/or conversion to open sternotomy for attempted surgical intervention. The patient is aware of the fact that transient  use of cardiopulmonary bypass may be necessary. I think he is a candidate for sternotomy if needed to manage any intraoperative complications although his risk for postop pulmonary complications would be high.   The patient has been advised of a variety of complications that might develop including but not limited to risks of death, stroke, paravalvular leak, aortic dissection or other major vascular complications, aortic annulus rupture, device embolization, cardiac rupture or perforation, mitral regurgitation, acute myocardial infarction, arrhythmia, heart block or bradycardia requiring permanent pacemaker placement, congestive heart failure, respiratory failure, renal failure, pneumonia, infection, other late complications related to structural valve deterioration or migration, or other complications that might ultimately cause a temporary or permanent loss of functional independence or other long term morbidity. The patient provides full informed consent for the procedure as described and all questions were answered.      Plan:  Transfemoral transcatheter aortic valve replacement.    Gaye Pollack, MD

## 2018-11-15 ENCOUNTER — Inpatient Hospital Stay (HOSPITAL_COMMUNITY)
Admission: RE | Admit: 2018-11-15 | Discharge: 2018-11-16 | DRG: 267 | Disposition: A | Discharge status: Home / Self Care | Payer: Medicare Other | Attending: Cardiovascular Disease | Admitting: Cardiovascular Disease

## 2018-11-15 ENCOUNTER — Other Ambulatory Visit: Payer: Self-pay

## 2018-11-15 ENCOUNTER — Inpatient Hospital Stay (HOSPITAL_COMMUNITY): Payer: Medicare Other | Admitting: Physician Assistant

## 2018-11-15 ENCOUNTER — Inpatient Hospital Stay (HOSPITAL_COMMUNITY): Payer: Medicare Other | Admitting: Registered Nurse

## 2018-11-15 ENCOUNTER — Encounter (HOSPITAL_COMMUNITY)
Admission: RE | Disposition: A | Discharge status: Home / Self Care | Payer: Self-pay | Source: Home / Self Care | Attending: Cardiovascular Disease

## 2018-11-15 ENCOUNTER — Encounter (HOSPITAL_COMMUNITY): Payer: Self-pay

## 2018-11-15 ENCOUNTER — Inpatient Hospital Stay (HOSPITAL_COMMUNITY): Payer: Medicare Other

## 2018-11-15 DIAGNOSIS — Z7982 Long term (current) use of aspirin: Secondary | ICD-10-CM | POA: Diagnosis not present

## 2018-11-15 DIAGNOSIS — Z79890 Hormone replacement therapy (postmenopausal): Secondary | ICD-10-CM

## 2018-11-15 DIAGNOSIS — Z9852 Vasectomy status: Secondary | ICD-10-CM | POA: Diagnosis not present

## 2018-11-15 DIAGNOSIS — I350 Nonrheumatic aortic (valve) stenosis: Secondary | ICD-10-CM

## 2018-11-15 DIAGNOSIS — I2510 Atherosclerotic heart disease of native coronary artery without angina pectoris: Secondary | ICD-10-CM | POA: Diagnosis present

## 2018-11-15 DIAGNOSIS — Z952 Presence of prosthetic heart valve: Secondary | ICD-10-CM

## 2018-11-15 DIAGNOSIS — I110 Hypertensive heart disease with heart failure: Secondary | ICD-10-CM | POA: Diagnosis present

## 2018-11-15 DIAGNOSIS — J449 Chronic obstructive pulmonary disease, unspecified: Secondary | ICD-10-CM | POA: Diagnosis present

## 2018-11-15 DIAGNOSIS — Z87891 Personal history of nicotine dependence: Secondary | ICD-10-CM

## 2018-11-15 DIAGNOSIS — E039 Hypothyroidism, unspecified: Secondary | ICD-10-CM

## 2018-11-15 DIAGNOSIS — I10 Essential (primary) hypertension: Secondary | ICD-10-CM

## 2018-11-15 DIAGNOSIS — M1990 Unspecified osteoarthritis, unspecified site: Secondary | ICD-10-CM | POA: Diagnosis present

## 2018-11-15 DIAGNOSIS — I5033 Acute on chronic diastolic (congestive) heart failure: Secondary | ICD-10-CM

## 2018-11-15 DIAGNOSIS — I5032 Chronic diastolic (congestive) heart failure: Secondary | ICD-10-CM

## 2018-11-15 DIAGNOSIS — Z006 Encounter for examination for normal comparison and control in clinical research program: Secondary | ICD-10-CM

## 2018-11-15 DIAGNOSIS — J441 Chronic obstructive pulmonary disease with (acute) exacerbation: Secondary | ICD-10-CM

## 2018-11-15 DIAGNOSIS — Z880 Allergy status to penicillin: Secondary | ICD-10-CM

## 2018-11-15 DIAGNOSIS — Z79899 Other long term (current) drug therapy: Secondary | ICD-10-CM | POA: Diagnosis not present

## 2018-11-15 DIAGNOSIS — Z953 Presence of xenogenic heart valve: Secondary | ICD-10-CM

## 2018-11-15 HISTORY — PX: TRANSCATHETER AORTIC VALVE REPLACEMENT, TRANSFEMORAL: SHX6400

## 2018-11-15 HISTORY — PX: TEE WITHOUT CARDIOVERSION: SHX5443

## 2018-11-15 HISTORY — DX: Presence of prosthetic heart valve: Z95.2

## 2018-11-15 LAB — POCT ACTIVATED CLOTTING TIME
Activated Clotting Time: 132 seconds
Activated Clotting Time: 104 seconds
Activated Clotting Time: 272 seconds

## 2018-11-15 LAB — POCT I-STAT 7, (LYTES, BLD GAS, ICA,H+H)
Acid-Base Excess: 9 mmol/L — ABNORMAL HIGH (ref 0.0–2.0)
Bicarbonate: 38.5 mmol/L — ABNORMAL HIGH (ref 20.0–28.0)
Calcium, Ion: 1.2 mmol/L (ref 1.15–1.40)
HCT: 35 % — ABNORMAL LOW (ref 39.0–52.0)
HEMOGLOBIN: 11.9 g/dL — AB (ref 13.0–17.0)
O2 Saturation: 96 %
Potassium: 4 mmol/L (ref 3.5–5.1)
SODIUM: 136 mmol/L (ref 135–145)
TCO2: 41 mmol/L — ABNORMAL HIGH (ref 22–32)
pCO2 arterial: 85.9 mmHg (ref 32.0–48.0)
pH, Arterial: 7.26 — ABNORMAL LOW (ref 7.350–7.450)
pO2, Arterial: 101 mmHg (ref 83.0–108.0)

## 2018-11-15 LAB — POCT I-STAT 4, (NA,K, GLUC, HGB,HCT)
Glucose, Bld: 107 mg/dL — ABNORMAL HIGH (ref 70–99)
Glucose, Bld: 111 mg/dL — ABNORMAL HIGH (ref 70–99)
Glucose, Bld: 120 mg/dL — ABNORMAL HIGH (ref 70–99)
HCT: 38 % — ABNORMAL LOW (ref 39.0–52.0)
HCT: 36 % — ABNORMAL LOW (ref 39.0–52.0)
HCT: 36 % — ABNORMAL LOW (ref 39.0–52.0)
HEMOGLOBIN: 12.2 g/dL — AB (ref 13.0–17.0)
Hemoglobin: 12.9 g/dL — ABNORMAL LOW (ref 13.0–17.0)
Hemoglobin: 12.2 g/dL — ABNORMAL LOW (ref 13.0–17.0)
Potassium: 3.5 mmol/L (ref 3.5–5.1)
Potassium: 3.9 mmol/L (ref 3.5–5.1)
Potassium: 4 mmol/L (ref 3.5–5.1)
SODIUM: 136 mmol/L (ref 135–145)
Sodium: 136 mmol/L (ref 135–145)
Sodium: 135 mmol/L (ref 135–145)

## 2018-11-15 LAB — POCT I-STAT CREATININE: Creatinine, Ser: 0.8 mg/dL (ref 0.61–1.24)

## 2018-11-15 SURGERY — IMPLANTATION, AORTIC VALVE, TRANSCATHETER, FEMORAL APPROACH
Anesthesia: Monitor Anesthesia Care | Site: Chest

## 2018-11-15 MED ORDER — LIDOCAINE HCL 1 % IJ SOLN
INTRAMUSCULAR | Status: AC
  Filled 2018-11-15: qty 40

## 2018-11-15 MED ORDER — FUROSEMIDE 40 MG PO TABS
40.0000 mg | ORAL_TABLET | Freq: Every day | ORAL | Status: DC
  Administered 2018-11-15 – 2018-11-16 (×2): 40 mg via ORAL
  Filled 2018-11-15 (×2): qty 1

## 2018-11-15 MED ORDER — HEPARIN (PORCINE) IN NACL 1000-0.9 UT/500ML-% IV SOLN
INTRAVENOUS | Status: DC | PRN
  Administered 2018-11-15 (×3): 500 mL

## 2018-11-15 MED ORDER — ALBUTEROL SULFATE (2.5 MG/3ML) 0.083% IN NEBU: 2.5000 mg | INHALATION_SOLUTION | Freq: Four times a day (QID) | RESPIRATORY_TRACT | Status: DC | PRN

## 2018-11-15 MED ORDER — VITAMIN D 25 MCG (1000 UNIT) PO TABS
1000.0000 [IU] | ORAL_TABLET | Freq: Every day | ORAL | Status: DC
  Administered 2018-11-16: 1000 [IU] via ORAL
  Filled 2018-11-15: qty 1

## 2018-11-15 MED ORDER — ONDANSETRON HCL 4 MG/2ML IJ SOLN: 4.0000 mg | Freq: Four times a day (QID) | INTRAMUSCULAR | Status: DC | PRN

## 2018-11-15 MED ORDER — ONDANSETRON HCL 4 MG/2ML IJ SOLN
INTRAMUSCULAR | Status: DC | PRN
  Administered 2018-11-15: 4 mg via INTRAVENOUS

## 2018-11-15 MED ORDER — LIDOCAINE HCL (PF) 1 % IJ SOLN
INTRAMUSCULAR | Status: DC | PRN
  Administered 2018-11-15: 20 mL

## 2018-11-15 MED ORDER — LACTATED RINGERS IV SOLN: INTRAVENOUS | Status: DC | PRN

## 2018-11-15 MED ORDER — FENTANYL CITRATE (PF) 250 MCG/5ML IJ SOLN
INTRAMUSCULAR | Status: DC | PRN
  Administered 2018-11-15 (×2): 25 ug via INTRAVENOUS

## 2018-11-15 MED ORDER — MORPHINE SULFATE (PF) 2 MG/ML IV SOLN: 1.0000 mg | INTRAVENOUS | Status: DC | PRN

## 2018-11-15 MED ORDER — CHLORHEXIDINE GLUCONATE 0.12 % MT SOLN
15.0000 mL | Freq: Once | OROMUCOSAL | Status: AC
  Administered 2018-11-15: 15 mL via OROMUCOSAL
  Filled 2018-11-15 (×2): qty 15

## 2018-11-15 MED ORDER — SODIUM CHLORIDE 0.9 % IV SOLN
INTRAVENOUS | Status: DC
  Administered 2018-11-15: 10:00:00 via INTRAVENOUS

## 2018-11-15 MED ORDER — CLOPIDOGREL BISULFATE 75 MG PO TABS
75.0000 mg | ORAL_TABLET | Freq: Every day | ORAL | Status: DC
  Administered 2018-11-16: 75 mg via ORAL
  Filled 2018-11-15: qty 1

## 2018-11-15 MED ORDER — SODIUM CHLORIDE 0.9 % IV SOLN
INTRAVENOUS | Status: DC | PRN
  Administered 2018-11-15: 20 ug/min via INTRAVENOUS

## 2018-11-15 MED ORDER — ALBUTEROL SULFATE HFA 108 (90 BASE) MCG/ACT IN AERS: 1.0000 | INHALATION_SPRAY | Freq: Four times a day (QID) | RESPIRATORY_TRACT | Status: DC | PRN

## 2018-11-15 MED ORDER — LACTATED RINGERS IV SOLN
INTRAVENOUS | Status: DC | PRN
  Administered 2018-11-15: 07:00:00 via INTRAVENOUS

## 2018-11-15 MED ORDER — PROPOFOL 10 MG/ML IV BOLUS
INTRAVENOUS | Status: DC | PRN
  Administered 2018-11-15: 20 mg via INTRAVENOUS

## 2018-11-15 MED ORDER — PROTAMINE SULFATE 10 MG/ML IV SOLN
INTRAVENOUS | Status: DC | PRN
  Administered 2018-11-15 (×4): 20 mg via INTRAVENOUS
  Administered 2018-11-15: 40 mg via INTRAVENOUS

## 2018-11-15 MED ORDER — VANCOMYCIN HCL IN DEXTROSE 1-5 GM/200ML-% IV SOLN
1000.0000 mg | Freq: Once | INTRAVENOUS | Status: AC
  Administered 2018-11-15: 1000 mg via INTRAVENOUS
  Filled 2018-11-15 (×2): qty 200

## 2018-11-15 MED ORDER — HEPARIN (PORCINE) IN NACL 1000-0.9 UT/500ML-% IV SOLN
INTRAVENOUS | Status: AC
  Filled 2018-11-15: qty 1500

## 2018-11-15 MED ORDER — OXYCODONE HCL 5 MG PO TABS: 5.0000 mg | ORAL_TABLET | ORAL | Status: DC | PRN

## 2018-11-15 MED ORDER — LEVOTHYROXINE SODIUM 50 MCG PO TABS
50.0000 ug | ORAL_TABLET | Freq: Every day | ORAL | Status: DC
  Administered 2018-11-16: 50 ug via ORAL
  Filled 2018-11-15: qty 1

## 2018-11-15 MED ORDER — CHLORHEXIDINE GLUCONATE 4 % EX LIQD
30.0000 mL | CUTANEOUS | Status: DC
  Filled 2018-11-15: qty 30

## 2018-11-15 MED ORDER — VITAMIN B-12 1000 MCG PO TABS
1000.0000 ug | ORAL_TABLET | Freq: Every day | ORAL | Status: DC
  Administered 2018-11-16: 1000 ug via ORAL
  Filled 2018-11-15: qty 1

## 2018-11-15 MED ORDER — ATORVASTATIN CALCIUM 10 MG PO TABS
20.0000 mg | ORAL_TABLET | Freq: Every day | ORAL | Status: DC
  Administered 2018-11-15: 20 mg via ORAL
  Filled 2018-11-15: qty 2

## 2018-11-15 MED ORDER — SODIUM CHLORIDE 0.9% FLUSH
3.0000 mL | Freq: Two times a day (BID) | INTRAVENOUS | Status: DC
  Administered 2018-11-15 – 2018-11-16 (×2): 3 mL via INTRAVENOUS

## 2018-11-15 MED ORDER — PHENYLEPHRINE HCL-NACL 20-0.9 MG/250ML-% IV SOLN
0.0000 ug/min | INTRAVENOUS | Status: DC
  Filled 2018-11-15: qty 250

## 2018-11-15 MED ORDER — SODIUM CHLORIDE 0.9 % IV SOLN: INTRAVENOUS | Status: DC

## 2018-11-15 MED ORDER — SODIUM CHLORIDE 0.9 % IV SOLN
0.0125 ug/kg/min | INTRAVENOUS | Status: AC
  Administered 2018-11-15: .05 ug/kg/min via INTRAVENOUS
  Filled 2018-11-15: qty 2000

## 2018-11-15 MED ORDER — TRAMADOL HCL 50 MG PO TABS: 50.0000 mg | ORAL_TABLET | ORAL | Status: DC | PRN

## 2018-11-15 MED ORDER — SODIUM CHLORIDE 0.9 % IV SOLN: 250.0000 mL | INTRAVENOUS | Status: DC | PRN

## 2018-11-15 MED ORDER — NITROGLYCERIN IN D5W 200-5 MCG/ML-% IV SOLN: 0.0000 ug/min | INTRAVENOUS | Status: DC

## 2018-11-15 MED ORDER — ACETAMINOPHEN 650 MG RE SUPP: 650.0000 mg | Freq: Four times a day (QID) | RECTAL | Status: DC | PRN

## 2018-11-15 MED ORDER — LEVOFLOXACIN IN D5W 750 MG/150ML IV SOLN
750.0000 mg | INTRAVENOUS | Status: AC
  Administered 2018-11-16: 750 mg via INTRAVENOUS
  Filled 2018-11-15: qty 150

## 2018-11-15 MED ORDER — IOPAMIDOL (ISOVUE-370) INJECTION 76%
INTRAVENOUS | Status: DC | PRN
  Administered 2018-11-15: 80 mL via INTRA_ARTERIAL

## 2018-11-15 MED ORDER — MUPIROCIN 2 % EX OINT
1.0000 "application " | TOPICAL_OINTMENT | Freq: Once | CUTANEOUS | Status: AC
  Administered 2018-11-15: 1 via TOPICAL
  Filled 2018-11-15: qty 22

## 2018-11-15 MED ORDER — ASPIRIN EC 81 MG PO TBEC
81.0000 mg | DELAYED_RELEASE_TABLET | Freq: Every day | ORAL | Status: DC
  Administered 2018-11-16: 81 mg via ORAL
  Filled 2018-11-15: qty 1

## 2018-11-15 MED ORDER — MUPIROCIN 2 % EX OINT
TOPICAL_OINTMENT | CUTANEOUS | Status: AC
  Administered 2018-11-15: 1 via TOPICAL
  Filled 2018-11-15: qty 22

## 2018-11-15 MED ORDER — HEPARIN SODIUM (PORCINE) 1000 UNIT/ML IJ SOLN
INTRAMUSCULAR | Status: DC | PRN
  Administered 2018-11-15: 12000 [IU] via INTRAVENOUS

## 2018-11-15 MED ORDER — CHLORHEXIDINE GLUCONATE 4 % EX LIQD
60.0000 mL | Freq: Once | CUTANEOUS | Status: DC
  Filled 2018-11-15: qty 60

## 2018-11-15 MED ORDER — SODIUM CHLORIDE 0.9% FLUSH: 3.0000 mL | INTRAVENOUS | Status: DC | PRN

## 2018-11-15 MED ORDER — ACETAMINOPHEN 325 MG PO TABS: 650.0000 mg | ORAL_TABLET | Freq: Four times a day (QID) | ORAL | Status: DC | PRN

## 2018-11-15 SURGICAL SUPPLY — 36 items
BAG SNAP BAND KOVER 36X36 (MISCELLANEOUS) ×8 IMPLANT
BLANKET WARM UNDERBOD FULL ACC (MISCELLANEOUS) ×4 IMPLANT
CABLE ADAPT PACING TEMP 12FT (ADAPTER) ×4 IMPLANT
CATH 29 EDWARDS DELIVERY SYS (CATHETERS) ×4 IMPLANT
CATH DIAG 6FR PIGTAIL ANGLED (CATHETERS) ×8 IMPLANT
CATH INFINITI 6F AL2 (CATHETERS) ×4 IMPLANT
CATH S G BIP PACING (CATHETERS) ×4 IMPLANT
CLOSURE MYNX CONTROL 6F/7F (Vascular Products) ×4 IMPLANT
CRIMPER (MISCELLANEOUS) ×4 IMPLANT
DERMABOND ADVANCED (GAUZE/BANDAGES/DRESSINGS) ×2
DERMABOND ADVANCED .7 DNX12 (GAUZE/BANDAGES/DRESSINGS) ×2 IMPLANT
DEVICE CLOSURE PERCLS PRGLD 6F (VASCULAR PRODUCTS) ×4 IMPLANT
DEVICE INFLATION ATRION QL2530 (MISCELLANEOUS) ×4 IMPLANT
DEVICE INFLATION ATRION QL38 (MISCELLANEOUS) ×4 IMPLANT
ELECT DEFIB PAD ADLT CADENCE (PAD) ×4 IMPLANT
GUIDEWIRE SAF TJ AMPL .035X180 (WIRE) IMPLANT
GUIDEWIRE SAFE TJ AMPLATZ EXST (WIRE) ×4 IMPLANT
KIT HEART LEFT (KITS) ×4 IMPLANT
KIT MICROPUNCTURE NIT STIFF (SHEATH) ×4 IMPLANT
PACK CARDIAC CATHETERIZATION (CUSTOM PROCEDURE TRAY) ×4 IMPLANT
PERCLOSE PROGLIDE 6F (VASCULAR PRODUCTS) ×8
SHEATH 16X36 EDWARDS (SHEATH) ×4 IMPLANT
SHEATH BRITE TIP 6FR 35CM (SHEATH) ×4 IMPLANT
SHEATH PINNACLE 6F 10CM (SHEATH) ×4 IMPLANT
SHEATH PINNACLE 8F 10CM (SHEATH) ×4 IMPLANT
SHEATH PROBE COVER 6X72 (BAG) ×8 IMPLANT
SLEEVE REPOSITIONING LENGTH 30 (MISCELLANEOUS) ×4 IMPLANT
STOPCOCK MORSE 400PSI 3WAY (MISCELLANEOUS) ×4 IMPLANT
SYR MEDRAD MARK V 150ML (SYRINGE) ×4 IMPLANT
TRANSDUCER W/STOPCOCK (MISCELLANEOUS) ×8 IMPLANT
TUBE CONN 8.8X1320 FR HP M-F (CONNECTOR) ×4 IMPLANT
VALVE HEART TRANSCATH SZ3 29MM (Valve) ×4 IMPLANT
WIRE AMPLATZ SS-J .035X180CM (WIRE) ×4 IMPLANT
WIRE EMERALD 3MM-J .035X150CM (WIRE) ×4 IMPLANT
WIRE EMERALD 3MM-J .035X260CM (WIRE) ×4 IMPLANT
WIRE EMERALD ST .035X260CM (WIRE) ×4 IMPLANT

## 2018-11-15 NOTE — Transfer of Care (Signed)
Immediate Anesthesia Transfer of Care Note  Patient: Kenneth Larsen  Procedure(s) Performed: TRANSCATHETER AORTIC VALVE REPLACEMENT, TRANSFEMORAL (N/A Chest) TRANSESOPHAGEAL ECHOCARDIOGRAM (TEE) (N/A )  Patient Location: Cath Lab  Anesthesia Type:MAC  Level of Consciousness: awake, alert  and oriented  Airway & Oxygen Therapy: Patient Spontanous Breathing and Patient connected to face mask oxygen  Post-op Assessment: Report given to RN and Post -op Vital signs reviewed and stable  Post vital signs: Reviewed and stable  Last Vitals:  Vitals Value Taken Time  BP 106/96 ABP   Temp    Pulse 59   Resp 16   SpO2 96     Last Pain:  Vitals:   11/15/18 0610  TempSrc:   PainSc: 0-No pain         Complications: No apparent anesthesia complications

## 2018-11-15 NOTE — Progress Notes (Addendum)
Paged Dr. Standley Brooking - arrived at bedside.  Advised of elevated C02 level of 85.  Baseline was elevated prior to surgery.  Pt is arousing and following all commands appropriately.  Pt can continue to transfer up to 4E if all other vitals are WNL.

## 2018-11-15 NOTE — CV Procedure (Signed)
[facility]  TAVR OPERATIVE NOTE   Date of Procedure:  11/15/2018  Preoperative Diagnosis: Severe Aortic Stenosis   Postoperative Diagnosis: Same   Procedure:    Transcatheter Aortic Valve Replacement - Transfemoral Approach  Edwards Sapien 3 THV (size 29 mm, model # [id number], serial # [id number])   Co-Surgeons:  Lauree Chandler, MD and Gaye Pollack, MD   Anesthesiologist:  Smith Robert  Echocardiographer:  Croitoru  Pre-operative Echo Findings:  Severe aortic stenosis  Normal left ventricular systolic function  Post-operative Echo Findings:  No paravalvular leak  Normal left ventricular systolic function  BRIEF CLINICAL NOTE AND INDICATIONS FOR SURGERY  73 yo male with history of COPD, chronic diastolic CHF and severe aortic stenosis here today for TAVR. He was admitted to [facility] February 2020 with dyspnea and lower extremity edema. He was felt to have acute diastolic CHF. He was treated for COPD and influenza and was diuresed. He was seen by Dr. Rockey Situ. Echo 10/10/18 with LVEF=50-55%. Moderate MAC with no regurgitation. The aortic valve is thickened and calcified with limited leaflet mobility. Mean gradient 48 mmHg, peak gradient 53 mmHg. AVA 0.81 cm2. Dimensionless index 0.20. This is consistent with severe aortic stenosis. Cardiac cath 10/13/18 with mild non-obstructive CAD. The aortic valve was not crossed.   During the course of the patient's preoperative work up they have been evaluated comprehensively by a multidisciplinary team of specialists coordinated through the [facility] in the [facility].  They have been demonstrated to suffer from symptomatic severe aortic stenosis as noted above. The patient has been counseled extensively as to the relative risks and benefits of all options for the treatment of severe aortic stenosis including long term medical therapy, conventional surgery for aortic valve  replacement, and transcatheter aortic valve replacement.  The patient has been independently evaluated by Dr. Cyndia Bent with CT surgery and he is felt to be at high risk for conventional surgical aortic valve replacement based on his COPD. The surgeon indicated the patient would be a poor candidate for conventional surgery. Based upon review of all of the patient's preoperative diagnostic tests they are felt to be candidate for transcatheter aortic valve replacement using the transfemoral approach as an alternative to high risk conventional surgery.    Following the decision to proceed with transcatheter aortic valve replacement, a discussion has been held regarding what types of management strategies would be attempted intraoperatively in the event of life-threatening complications, including whether or not the patient would be considered a candidate for the use of cardiopulmonary bypass and/or conversion to open sternotomy for attempted surgical intervention.  The patient has been advised of a variety of complications that might develop peculiar to this approach including but not limited to risks of death, stroke, paravalvular leak, aortic dissection or other major vascular complications, aortic annulus rupture, device embolization, cardiac rupture or perforation, acute myocardial infarction, arrhythmia, heart block or bradycardia requiring permanent pacemaker placement, congestive heart failure, respiratory failure, renal failure, pneumonia, infection, other late complications related to structural valve deterioration or migration, or other complications that might ultimately cause a temporary or permanent loss of functional independence or other long term morbidity.  The patient provides full informed consent for the procedure as described and all questions were answered preoperatively.    DETAILS OF THE OPERATIVE PROCEDURE  PREPARATION:   The patient is brought to the operating room on the above mentioned  date and central monitoring was established by the anesthesia team  including placement of a radial arterial line. The patient is placed in the supine position on the operating table.  Intravenous antibiotics are administered. Conscious sedation is used.   Baseline transthoracic echocardiogram was performed. The patient's chest, abdomen, both groins, and both lower extremities are prepared and draped in a sterile manner. A time out procedure is performed.   PERIPHERAL ACCESS:   Using the modified Seldinger technique, femoral arterial and venous access were obtained with placement of 6 Fr sheaths on the right side using u/s guidance.  A pigtail diagnostic catheter was passed through the femoral arterial sheath under fluoroscopic guidance into the aortic root.  A temporary transvenous pacemaker catheter was passed through the femoral venous sheath under fluoroscopic guidance into the right ventricle.  The pacemaker was tested to ensure stable lead placement and pacemaker capture. Aortic root angiography was performed in order to determine the optimal angiographic angle for valve deployment.  TRANSFEMORAL ACCESS:  A micropuncture kit was used to gain access to the left femoral artery using u/s guidance. Position confirmed with angiography. Pre-closure with double ProGlide closure devices. The patient was heparinized systemically and ACT verified > 250 seconds.    A 16 Fr transfemoral E-sheath was introduced into the left femoral artery after progressively dilating over an Amplatz superstiff wire. An AL-2 catheter was used to direct a straight-tip exchange length wire across the native aortic valve into the left ventricle. This was exchanged out for a pigtail catheter and position was confirmed in the LV apex. Simultaneous LV and Ao pressures were recorded.  The pigtail catheter was then exchanged for an Amplatz Extra-stiff wire in the LV apex.   TRANSCATHETER HEART VALVE DEPLOYMENT:  An Edwards Sapien 3  THV (size 29 mm) was prepared and crimped per manufacturer's guidelines, and the proper orientation of the valve is confirmed on the [employer] delivery system. The valve was advanced through the introducer sheath using normal technique until in an appropriate position in the abdominal aorta beyond the sheath tip. The balloon was then retracted and using the fine-tuning wheel was centered on the valve. The valve was then advanced across the aortic arch using appropriate flexion of the catheter. The valve was carefully positioned across the aortic valve annulus. The Commander catheter was retracted using normal technique. Once final position of the valve has been confirmed by angiographic assessment, the valve is deployed while temporarily holding ventilation and during rapid ventricular pacing to maintain systolic blood pressure < 50 mmHg and pulse pressure < 10 mmHg. The balloon inflation is held for >3 seconds after reaching full deployment volume. Once the balloon has fully deflated the balloon is retracted into the ascending aorta and valve function is assessed using TTE. There is felt to be no paravalvular leak and no central aortic insufficiency.  The patient's hemodynamic recovery following valve deployment is good.  The deployment balloon and guidewire are both removed. Echo demostrated acceptable post-procedural gradients, stable mitral valve function, and no AI.   PROCEDURE COMPLETION:  The sheath was then removed and closure devices were completed. Protamine was administered once femoral arterial repair was complete. The temporary pacemaker, pigtail catheters and femoral sheaths were removed with Mynx closure devices placed in the artery and manual pressure used for hemostasis of the vein.  The patient tolerated the procedure well and is transported to the surgical intensive care in stable condition. There were no immediate intraoperative complications. All sponge instrument and needle counts  are verified correct at completion of the operation.  No blood products were administered during the operation.  The patient received a total of 80 mL of intravenous contrast during the procedure.  Lauree Chandler MD 11/15/2018 9:45 AM

## 2018-11-15 NOTE — Anesthesia Procedure Notes (Signed)
Arterial Line Insertion Start/End3/24/2020 7:00 AM, 11/15/2018 7:08 AM Performed by: Jearld Pies, CRNA, CRNA  Patient location: Pre-op. Preanesthetic checklist: patient identified, IV checked, site marked, risks and benefits discussed, surgical consent, monitors and equipment checked, pre-op evaluation, timeout performed and anesthesia consent Lidocaine 1% used for infiltration and patient sedated Right, radial was placed Catheter size: 20 G Hand hygiene performed , maximum sterile barriers used  and Seldinger technique used Allen's test indicative of satisfactory collateral circulation Attempts: 1 Procedure performed without using ultrasound guided technique. Following insertion, dressing applied and Biopatch. Post procedure assessment: normal  Patient tolerated the procedure well with no immediate complications.

## 2018-11-15 NOTE — Progress Notes (Signed)
All groin checks performed per protocol - all sites are Level 0

## 2018-11-15 NOTE — Anesthesia Postprocedure Evaluation (Signed)
Anesthesia Post Note  Patient: Kenneth Larsen  Procedure(s) Performed: TRANSCATHETER AORTIC VALVE REPLACEMENT, TRANSFEMORAL (N/A Chest) TRANSESOPHAGEAL ECHOCARDIOGRAM (TEE) (N/A )     Patient location during evaluation: PACU Anesthesia Type: MAC Level of consciousness: awake and alert Pain management: pain level controlled Vital Signs Assessment: post-procedure vital signs reviewed and stable Respiratory status: spontaneous breathing, nonlabored ventilation, respiratory function stable and patient connected to nasal cannula oxygen Cardiovascular status: stable and blood pressure returned to baseline Postop Assessment: no apparent nausea or vomiting Anesthetic complications: no    Last Vitals:  Vitals:   11/15/18 1230 11/15/18 1330  BP: 127/69 (!) 142/73  Pulse: 78 70  Resp: (!) 21   Temp:    SpO2: 100% 97%    Last Pain:  Vitals:   11/15/18 1215  TempSrc:   PainSc: 0-No pain                 Effie Berkshire

## 2018-11-15 NOTE — Anesthesia Procedure Notes (Signed)
Procedure Name: MAC Date/Time: 11/15/2018 7:30 AM Performed by: Jearld Pies, CRNA Pre-anesthesia Checklist: Patient identified, Emergency Drugs available, Suction available and Patient being monitored Oxygen Delivery Method: Simple face mask Placement Confirmation: positive ETCO2 Dental Injury: Teeth and Oropharynx as per pre-operative assessment

## 2018-11-15 NOTE — Progress Notes (Signed)
  Echocardiogram 2D Echocardiogram has been performed.  Jennette Dubin 11/15/2018, 9:31 AM

## 2018-11-15 NOTE — Interval H&P Note (Signed)
History and Physical Interval Note:  11/15/2018 6:58 AM  Kenneth Larsen  has presented today for surgery, with the diagnosis of Severe Aortic Stenosis.  The various methods of treatment have been discussed with the patient and family. After consideration of risks, benefits and other options for treatment, the patient has consented to  Procedure(s): TRANSCATHETER AORTIC VALVE REPLACEMENT, TRANSFEMORAL (N/A) TRANSESOPHAGEAL ECHOCARDIOGRAM (TEE) (N/A) as a surgical intervention.  The patient's history has been reviewed, patient examined, no change in status, stable for surgery.  I have reviewed the patient's chart and labs.  Questions were answered to the patient's satisfaction.     Gaye Pollack

## 2018-11-15 NOTE — Op Note (Signed)
[facility]   MULTIDISCIPLINARY HEART VALVE TEAM   TAVR OPERATIVE NOTE   Date of Procedure:  11/15/2018  Preoperative Diagnosis: Severe Aortic Stenosis   Postoperative Diagnosis: Same   Procedure:    Transcatheter Aortic Valve Replacement - Percutaneous Left Transfemoral Approach  Edwards Sapien 3 THV (size 29 mm, model # 9600TFX, serial # [id number])   Co-Surgeons:  Gaye Pollack, MD and Lauree Chandler, MD   Anesthesiologist:  Wilfrid Lund, MD  Echocardiographer:  Jerilynn Mages. Croitoru, MD  Pre-operative Echo Findings:  Severe aortic stenosis  Normal left ventricular systolic function  Post-operative Echo Findings:  No paravalvular leak  Normal left ventricular systolic function   BRIEF CLINICAL NOTE AND INDICATIONS FOR SURGERY  This 73 year old gentleman has stage D, severe, symptomatic aortic stenosis with New York Heart Association class III symptoms of exertional fatigue and shortness of breath consistent with chronic diastolic congestive heart failure. He was recently admitted last month to [facility] with acute on chronic respiratory failure with hypoxemia at which time he tested positive for influenza but also had some pulmonary edema and congestive heart failure. He has baseline severe COPD. I have personally reviewed his 2D echocardiogram, cardiac catheterization, and CTA studies. His aortic valve is trileaflet with severe calcification of the leaflets and immobility. The mean gradient across aortic valve was 48 mmHg with a calculated valve area of 0.8 cm. Peak aortic valve gradient was 54 mmHg. Left ventricular ejection fraction was 50 to 55%. Cardiac catheterization showed mild nonobstructive coronary disease. Pulmonary artery pressures were moderately elevated with normal cardiac output. I agree that aortic valve replacement is indicated in this patient with severe aortic stenosis and progressive symptoms as well as a  recent admission for congestive heart failure exacerbation and pulmonary edema. His symptoms were improved with continued aggressive diuresis but he is at high risk for recurrent congestive heart failure. I think his operative risk for open surgical AVR would be high due to his age with severe obstructive and restrictive lung disease as well as a severe reduction in diffusion capacity on recent PFTs. I think transcatheter aortic valve replacement be the best treatment for this patient. His gated cardiac CTA shows anatomy that is suitable for transcatheter aortic valve replacement using a 29 mm Sapien 3valve with no complicating features. His abdominal pelvic CTA shows adequate pelvic vascular anatomy to allow transfemoral insertion.  The patientand his wife and daughter werecounseled at length regarding treatment alternatives for management of severe symptomatic aortic stenosis. The risks and benefits of surgical intervention has been discussed in detail. Long-term prognosis with medical therapy was discussed. Alternative approaches such as conventional surgical aortic valve replacement, transcatheter aortic valve replacement, and palliative medical therapy were compared and contrasted at length. This discussion was placed in the context of the patient's own specific clinical presentation and past medical history. All of their questions havebeen addressed.   Following the decision to proceed with transcatheter aortic valve replacement, a discussion was held regarding what types of management strategies would be attempted intraoperatively in the event of life-threatening complications, including whether or not the patient would be considered a candidate for the use of cardiopulmonary bypass and/or conversion to open sternotomy for attempted surgical intervention. The patient is aware of the fact that transient use of cardiopulmonary bypass may be necessary. I think he is a candidate for sternotomy if  needed to manage any intraoperative complications although his risk for postop pulmonary complications would be high.  The patient has  been advised of a variety of complications that might develop including but not limited to risks of death, stroke, paravalvular leak, aortic dissection or other major vascular complications, aortic annulus rupture, device embolization, cardiac rupture or perforation, mitral regurgitation, acute myocardial infarction, arrhythmia, heart block or bradycardia requiring permanent pacemaker placement, congestive heart failure, respiratory failure, renal failure, pneumonia, infection, other late complications related to structural valve deterioration or migration, or other complications that might ultimately cause a temporary or permanent loss of functional independence or other long term morbidity. The patient provides full informed consent for the procedure as described and all questions were answered.   DETAILS OF THE OPERATIVE PROCEDURE  PREPARATION:    The patient is brought to the operating room on the above mentioned date and central monitoring was established by the anesthesia team including placement of a radial arterial line. The patient is placed in the supine position on the operating table.  Intravenous antibiotics are administered. The patient is monitored closely throughout the procedure under conscious sedation.  Baseline transthoracic echocardiogram was performed. The patient's chest, abdomen, both groins, and both lower extremities are prepared and draped in a sterile manner. A time out procedure is performed.   PERIPHERAL ACCESS:    Using the modified Seldinger technique, femoral arterial and venous access was obtained with placement of 6 Fr sheaths on the right side.  A pigtail diagnostic catheter was passed through the right arterial sheath under fluoroscopic guidance into the aortic root.  A temporary transvenous pacemaker catheter was passed through  the right femoral venous sheath under fluoroscopic guidance into the right ventricle.  The pacemaker was tested to ensure stable lead placement and pacemaker capture. Aortic root angiography was performed in order to determine the optimal angiographic angle for valve deployment.   TRANSFEMORAL ACCESS:   Percutaneous transfemoral access and sheath placement was performed using ultrasound guidance.  The left common femoral artery was cannulated using a micropuncture needle and appropriate location was verified using hand injection angiogram.  A pair of Abbott Perclose percutaneous closure devices were placed and a 6 French sheath replaced into the femoral artery.  The patient was heparinized systemically and ACT verified > 250 seconds.    A 16 Fr transfemoral E-sheath was introduced into the left femoral artery after progressively dilating over an Amplatz superstiff wire. An AL-2 catheter was used to direct a straight-tip exchange length wire across the native aortic valve into the left ventricle. This was exchanged out for a pigtail catheter and position was confirmed in the LV apex. Simultaneous LV and Ao pressures were recorded.  The pigtail catheter was exchanged for an Amplatz Extra-stiff wire in the LV apex.     BALLOON AORTIC VALVULOPLASTY:   Not performed.   TRANSCATHETER HEART VALVE DEPLOYMENT:   An Edwards Sapien 3 transcatheter heart valve (size 29 mm, model #9600TFX, serial #[id number]) was prepared and crimped per manufacturer's guidelines, and the proper orientation of the valve is confirmed on the [employer] delivery system. The valve was advanced through the introducer sheath using normal technique until in an appropriate position in the abdominal aorta beyond the sheath tip. The balloon was then retracted and using the fine-tuning wheel was centered on the valve. The valve was then advanced across the aortic arch using appropriate flexion of the catheter. The valve was carefully  positioned across the aortic valve annulus. The Commander catheter was retracted using normal technique. Once final position of the valve has been confirmed by angiographic assessment, the valve is  deployed while temporarily holding ventilation and during rapid ventricular pacing to maintain systolic blood pressure < 50 mmHg and pulse pressure < 10 mmHg. The balloon inflation is held for >3 seconds after reaching full deployment volume. Once the balloon has fully deflated the balloon is retracted into the ascending aorta and valve function is assessed using echocardiography. There is felt to be no paravalvular leak and no central aortic insufficiency.  The patient's hemodynamic recovery following valve deployment is good.  The deployment balloon and guidewire are both removed.    PROCEDURE COMPLETION:   The sheath was removed and femoral artery closure performed using the previously placed Perclose devices.  Protamine was administered once femoral arterial repair was complete. The temporary pacemaker, pigtail catheters and femoral venous sheath were removed with manual pressure used for hemostasis. A Mynx closure device was used for the right femoral arterial sheath.  The patient tolerated the procedure well and is transported to the cath lab recovery area in stable condition. There were no immediate intraoperative complications. All sponge instrument and needle counts are verified correct at completion of the operation.   No blood products were administered during the operation.  The patient received a total of 80 mL of intravenous contrast during the procedure.   Gaye Pollack, MD 11/15/2018

## 2018-11-16 ENCOUNTER — Other Ambulatory Visit: Payer: Self-pay

## 2018-11-16 ENCOUNTER — Inpatient Hospital Stay (HOSPITAL_COMMUNITY): Payer: Medicare Other

## 2018-11-16 ENCOUNTER — Encounter (HOSPITAL_COMMUNITY): Payer: Self-pay | Admitting: Cardiovascular Disease

## 2018-11-16 ENCOUNTER — Other Ambulatory Visit: Payer: Self-pay | Admitting: Physician Assistant

## 2018-11-16 DIAGNOSIS — I10 Essential (primary) hypertension: Secondary | ICD-10-CM

## 2018-11-16 DIAGNOSIS — J449 Chronic obstructive pulmonary disease, unspecified: Secondary | ICD-10-CM

## 2018-11-16 DIAGNOSIS — Z952 Presence of prosthetic heart valve: Secondary | ICD-10-CM

## 2018-11-16 DIAGNOSIS — I5032 Chronic diastolic (congestive) heart failure: Secondary | ICD-10-CM

## 2018-11-16 DIAGNOSIS — I350 Nonrheumatic aortic (valve) stenosis: Principal | ICD-10-CM

## 2018-11-16 DIAGNOSIS — E039 Hypothyroidism, unspecified: Secondary | ICD-10-CM

## 2018-11-16 DIAGNOSIS — J441 Chronic obstructive pulmonary disease with (acute) exacerbation: Secondary | ICD-10-CM

## 2018-11-16 DIAGNOSIS — I5033 Acute on chronic diastolic (congestive) heart failure: Secondary | ICD-10-CM

## 2018-11-16 LAB — BASIC METABOLIC PANEL
Anion gap: 5 (ref 5–15)
BUN: 12 mg/dL (ref 8–23)
CO2: 35 mmol/L — AB (ref 22–32)
Calcium: 8.1 mg/dL — ABNORMAL LOW (ref 8.9–10.3)
Chloride: 94 mmol/L — ABNORMAL LOW (ref 98–111)
Creatinine, Ser: 0.87 mg/dL (ref 0.61–1.24)
GFR calc Af Amer: >60 mL/min (ref >60)
GFR calc non Af Amer: >60 mL/min (ref >60)
Glucose, Bld: 112 mg/dL — ABNORMAL HIGH (ref 70–99)
Potassium: 4.3 mmol/L (ref 3.5–5.1)
Sodium: 134 mmol/L — ABNORMAL LOW (ref 135–145)

## 2018-11-16 LAB — CBC
HCT: 37.3 % — ABNORMAL LOW (ref 39.0–52.0)
Hemoglobin: 11.7 g/dL — ABNORMAL LOW (ref 13.0–17.0)
MCH: 29.6 pg (ref 26.0–34.0)
MCHC: 31.4 g/dL (ref 30.0–36.0)
MCV: 94.4 fL (ref 80.0–100.0)
Platelets: 138 10*3/uL — ABNORMAL LOW (ref 150–400)
RBC: 3.95 MIL/uL — ABNORMAL LOW (ref 4.22–5.81)
RDW: 13 % (ref 11.5–15.5)
WBC: 7.5 10*3/uL (ref 4.0–10.5)
nRBC: 0 % (ref 0.0–0.2)

## 2018-11-16 LAB — ECHOCARDIOGRAM LIMITED
Height: 73 in
Weight: 3827.19 oz

## 2018-11-16 LAB — MAGNESIUM: Magnesium: 1.8 mg/dL (ref 1.7–2.4)

## 2018-11-16 MED ORDER — CLOPIDOGREL BISULFATE 75 MG PO TABS: 75.0000 mg | ORAL_TABLET | Freq: Every day | ORAL | 3 refills | Status: DC

## 2018-11-16 MED FILL — Lidocaine HCl Local Inj 1%: INTRAMUSCULAR | Qty: 20 | Status: AC

## 2018-11-16 NOTE — Progress Notes (Signed)
  Echocardiogram 2D Echocardiogram has been performed.  Yeraldi Fidler L Androw 11/16/2018, 8:46 AM

## 2018-11-16 NOTE — Discharge Instructions (Signed)
ACTIVITY AND EXERCISE °• Daily activity and exercise are an important part of your recovery. People recover at different rates depending on their general health and type of valve procedure. °• Most people recovering from TAVR feel better relatively quickly  °• No lifting, pushing, pulling more than 10 pounds (examples to avoid: groceries, vacuuming, gardening, golfing): °            - For one week with a procedure through the groin. °            - For six weeks for procedures through the chest wall or neck °NOTE: You will typically see one of our providers 7-14 days after your procedure to discuss WHEN TO RESUME the above activities.  °  °  °DRIVING °• Do not drive for until you are seen for follow up and cleared by a provider. Generally, we ask patient to not drive for 1 week after their procedure. °• If you have been told by your doctor in the past that you may not drive, you must talk with him/her before you begin driving again. °  °  °DRESSING °• Groin site: you may leave the clear dressing over the site for up to one week or until it falls off. °  °  °HYGIENE °• If you had a femoral (leg) procedure, you may take a shower when you return home. After the shower, pat the site dry. Do NOT use powder, oils or lotions in your groin area until the site has completely healed. °• If you had a chest procedure, you may shower when you return home unless specifically instructed not to by your discharging practitioner. °            - DO NOT scrub incision; pat dry with a towel °            - DO NOT apply any lotions, oils, powders to the incision °            - No tub baths / swimming for at least 2 weeks. °• If you notice any fevers, chills, increased pain, swelling, bleeding or pus, please contact your doctor. °  °ADDITIONAL INFORMATION °• If you are going to have an upcoming dental procedure, please contact our office as you will require antibiotics ahead of time to prevent infection on your heart valve.  ° ° °If you  have any questions or concerns you can call the structural heart phone during normal business hours 8am-4pm. If you have an urgent need after hours or weekends please call [phone] to talk to the on call provider for general cardiology. If you have an emergency that requires immediate attention, please call 911.  ° ° °After TAVR Checklist ° °Check  Test Description  ° Follow up appointment in 1-2 weeks  You will see our structural heart physician assistant, Katie Audriella Blakeley. Your incision sites will be checked and you will be cleared to drive and resume all normal activities if you are doing well.    ° 1 month echo and follow up  You will have an echo to check on your new heart valve and be seen back in the office by Katie Lavona Norsworthy. Many times the echo is not read by your appointment time, but Katie will call you later that day or the following day to report your results.  ° Follow up with your primary cardiologist You will need to be seen by your primary cardiologist in the following 3-6 months after your 1   month appointment in the valve clinic. Often times your Plavix or Aspirin will be discontinued during this time, but this is decided on a case by case basis.    1 year echo and follow up You will have another echo to check on your heart valve after 1 year and be seen back in the office by Nell Range. This your last structural heart visit.   Bacterial endocarditis prophylaxis  You will have to take antibiotics for the rest of your life before all dental procedures (even teeth cleanings) to protect your heart valve. Antibiotics are also required before some surgeries. Please check with your cardiologist before scheduling any surgeries. Also, please make sure to tell us if you have a penicillin allergy as you will require an alternative antibiotic.    YOUR CARDIOLOGY TEAM HAS ARRANGED FOR AN E-VISIT FOR YOUR APPOINTMENT - PLEASE REVIEW IMPORTANT INFORMATION BELOW SEVERAL DAYS PRIOR TO YOUR  APPOINTMENT  Due to the recent COVID-19 pandemic, we are transitioning in-person office visits to tele-medicine visits in an effort to decrease unnecessary exposure to our patients and staff. Medicare and most insurances are covering these visits without a copay needed. You will need a working email and a smartphone or computer with a camera and microphone. For patients that do not have these items, we can still complete the visit using a telephone but do prefer video when possible. If possible, we also ask that you have a blood pressure cuff and scale at home to measure your blood pressure, heart rate and weight prior to your scheduled appointment. Patients with clinical needs that need an in-person evaluation and testing will still be able to come to the office if absolutely necessary. If you have any questions, feel free to call our office.     DOWNLOADING THE SOFTWARE  Download the [employer] app to enable video and telephone visits with your [facility] Provider.   Instructions for downloading [employer] WebEx: - Go to [email or web address] and follow the instructions, or download the app on your smartphone [facility] [employer] Meetings). - If you have technical difficulties with downloading WebEx, please call WebEx at [phone]. - Once the app is downloaded (can be done on either mobile or desktop computer), go to Settings in the upper left hand corner.  Be sure that camera and audio are enabled.  - You will receive an email message with a link to the meeting with a time to join for your tele-health visit.  - Please download the app and have settings configured prior to the appointment time.      2-3 DAYS BEFORE YOUR APPOINTMENT  One of our staff will call you to confirm that you have been able to set up your WebEx account. We will remind you check your blood pressure, heart rate and weight prior to your scheduled appointment. If you have an Apple Watch or Kardia, please upload  any pertinent ECG strips the day before or morning of your appointment to [place]. Our staff will also make sure you have reviewed the consent and agree to move forward with your scheduled tele-health visit.    THE DAY OF YOUR APPOINTMENT  Approximately 15-20 minutes prior to your scheduled appointment, you will receive an e-mail directly from one of our staff member's [email or web address] e-mail accounts inviting you to join a WebEx meeting.  Please do not reply to that email - simply join the [employer].  Upon joining, a member of the office staff will speak with you initially through the  WebEx platform to confirm medications, vital signs for the day and any symptoms you may be experiencing.  Please have this information available prior to the time of visit start.      CONSENT FOR TELE-HEALTH VISIT - PLEASE RVIEW  I hereby voluntarily request, consent and authorize [facility] and its employed or contracted physicians, physician assistants, nurse practitioners or other licensed health care professionals (the Practitioner), to provide me with telemedicine health care services (the Services") as deemed necessary by the treating Practitioner. I acknowledge and consent to receive the Services by the Practitioner via telemedicine. I understand that the telemedicine visit will involve communicating with the Practitioner through live audiovisual communication technology and the disclosure of certain medical information by electronic transmission. I acknowledge that I have been given the opportunity to request an in-person assessment or other available alternative prior to the telemedicine visit and am voluntarily participating in the telemedicine visit.  I understand that I have the right to withhold or withdraw my consent to the use of telemedicine in the course of my care at any time, without affecting my right to future care or treatment, and that the Practitioner or I may terminate the telemedicine  visit at any time. I understand that I have the right to inspect all information obtained and/or recorded in the course of the telemedicine visit and may receive copies of available information for a reasonable fee.  I understand that some of the potential risks of receiving the Services via telemedicine include:   Delay or interruption in medical evaluation due to technological equipment failure or disruption;  Information transmitted may not be sufficient (e.g. poor resolution of images) to allow for appropriate medical decision making by the Practitioner; and/or   In rare instances, security protocols could fail, causing a breach of personal health information.  Furthermore, I acknowledge that it is my responsibility to provide information about my medical history, conditions and care that is complete and accurate to the best of my ability. I acknowledge that Practitioner's advice, recommendations, and/or decision may be based on factors not within their control, such as incomplete or inaccurate data provided by me or distortions of diagnostic images or specimens that may result from electronic transmissions. I understand that the practice of medicine is not an exact science and that Practitioner makes no warranties or guarantees regarding treatment outcomes. I acknowledge that I will receive a copy of this consent concurrently upon execution via email to the email address I last provided but may also request a printed copy by calling the office of [place].    I understand that my insurance will be billed for this visit.   I have read or had this consent read to me.  I understand the contents of this consent, which adequately explains the benefits and risks of the Services being provided via telemedicine.   I have been provided ample opportunity to ask questions regarding this consent and the Services and have had my questions answered to my satisfaction.  I give my informed consent for  the services to be provided through the use of telemedicine in my medical care  By participating in this telemedicine visit I agree to the above.

## 2018-11-16 NOTE — Progress Notes (Signed)
Patient in a stable condition, discharge education reviewed with patient he verbalized understanding, tele dc ccmd notified, patient belongings at bedside, patient awaiting his family for transportation home.

## 2018-11-16 NOTE — Progress Notes (Deleted)
  Echocardiogram 2D Echocardiogram has been performed.  Kenneth Larsen 11/16/2018, 8:53 AM

## 2018-11-16 NOTE — Progress Notes (Signed)
CARDIAC REHAB PHASE I   PRE:  Rate/Rhythm: 76 BBB  BP:  Sitting: 128/70      SaO2: 97 2L  MODE:  Ambulation: 350 ft   POST:  Rate/Rhythm: 110 ST with BBB  BP:  Sitting: 143/69    SaO2: 87 2L --> 93 2L   Pt ambulated 373ft in hallway standby assist with steady gait. Pt denies CP or SOB. Pt noted to have desated with walking, but continued to deny feeling SOB. Pts sats quickly rebounded. Pt educated on site care. Pt declining heart healthy diet and exercise guidelines at this time. Encouraged to slowly and safely increase ambulation as tolerated. Pt also declining CRP II at this. Will touch base with office if he changes his mind though.   [id number] Rufina Falco, RN BSN 11/16/2018 11:37 AM

## 2018-11-16 NOTE — Progress Notes (Signed)
Progress Note  Patient Name: Kenneth Larsen Date of Encounter: 11/16/2018  Primary Cardiologist: No primary care provider on file. Dr. Rockey Situ  Subjective   No chest pain or dyspnea. No events overnight  Inpatient Medications    Scheduled Meds: . aspirin EC  81 mg Oral Daily  . atorvastatin  20 mg Oral q1800  . cholecalciferol  1,000 Units Oral Daily  . clopidogrel  75 mg Oral Q breakfast  . furosemide  40 mg Oral Daily  . levothyroxine  50 mcg Oral QAC breakfast  . sodium chloride flush  3 mL Intravenous Q12H  . vitamin B-12  1,000 mcg Oral Daily   Continuous Infusions: . sodium chloride    . levofloxacin (LEVAQUIN) IV    . nitroGLYCERIN    . phenylephrine (NEO-SYNEPHRINE) Adult infusion     PRN Meds: sodium chloride, acetaminophen **OR** acetaminophen, albuterol, morphine injection, ondansetron (ZOFRAN) IV, oxyCODONE, sodium chloride flush, traMADol   Vital Signs    Vitals:   11/16/18 0013 11/16/18 0500 11/16/18 0507 11/16/18 0727  BP: (!) 112/56  128/61 134/63  Pulse: 86  86 80  Resp: (!) 21  16 (!) 22  Temp: 98.8 F (37.1 C)  97.8 F (36.6 C) 98.5 F (36.9 C)  TempSrc: Oral  Oral Oral  SpO2: 95%  96% 97%  Weight:  108.5 kg    Height:        Intake/Output Summary (Last 24 hours) at 11/16/2018 0815 Last data filed at 11/16/2018 0600 Gross per 24 hour  Intake 1630 ml  Output 1720 ml  Net -90 ml   Last 3 Weights 11/16/2018 11/15/2018 11/11/2018  Weight (lbs) 239 lb 3.2 oz 236 lb 1.6 oz 236 lb 1.6 oz  Weight (kg) 108.5 kg 107.094 kg 107.094 kg      Telemetry    Sinus, PVCs- Personally Reviewed  ECG    NSR, non-specific ST/T wave abn unchanged- Personally Reviewed  Physical Exam   GEN: No acute distress.   Neck: No JVD Cardiac: RRR, soft systolic murmur.   Respiratory: Clear to auscultation bilaterally. GI: Soft, nontender, non-distended  EXT: no LE edema. Bilateral groins without hematoma.  Neuro:  Nonfocal  Psych: Normal affect   Labs     Chemistry Recent Labs  Lab 11/11/18 0936  11/15/18 0855 11/15/18 0922 11/15/18 0951 11/15/18 0958 11/16/18 0246  NA 136   < > 135 136 136  --  134*  K 3.5   < > 3.9 4.0 4.0  --  4.3  CL 96*  --   --   --   --   --  94*  CO2 28  --   --   --   --   --  35*  GLUCOSE 89   < > 111* 120*  --   --  112*  BUN 12  --   --   --   --   --  12  CREATININE 0.92  --   --   --   --  0.80 0.87  CALCIUM 8.5*  --   --   --   --   --  8.1*  PROT 5.9*  --   --   --   --   --   --   ALBUMIN 3.2*  --   --   --   --   --   --   AST 15  --   --   --   --   --   --  ALT 12  --   --   --   --   --   --   ALKPHOS 45  --   --   --   --   --   --   BILITOT 1.3*  --   --   --   --   --   --   GFRNONAA >60  --   --   --   --   --  >60  GFRAA >60  --   --   --   --   --  >60  ANIONGAP 12  --   --   --   --   --  5   < > = values in this interval not displayed.     Hematology Recent Labs  Lab 11/11/18 [phone]  11/15/18 0922 11/15/18 0951 11/16/18 0246  WBC 6.1  --   --   --  7.5  RBC 4.46  --   --   --  3.95*  HGB 13.1   < > 12.2* 11.9* 11.7*  HCT 41.8   < > 36.0* 35.0* 37.3*  MCV 93.7  --   --   --  94.4  MCH 29.4  --   --   --  29.6  MCHC 31.3  --   --   --  31.4  RDW 12.9  --   --   --  13.0  PLT 204  --   --   --  138*   < > = values in this interval not displayed.    Cardiac EnzymesNo results for input(s): TROPONINI in the last 168 hours. No results for input(s): TROPIPOC in the last 168 hours.   BNP Recent Labs  Lab 11/11/18 0937  BNP 324.1*     DDimer No results for input(s): DDIMER in the last 168 hours.   Radiology    Dg Chest Port 1 View  Result Date: 11/15/2018 CLINICAL DATA:  S/p TAVR using bioprosthetic. EXAM: PORTABLE CHEST 1 VIEW COMPARISON:  11/11/2018 FINDINGS: Stable cardiomegaly with aortic atherosclerosis. Evidence of recent TAVR. Bibasilar atelectasis with chronic elevation of the left hemidiaphragm. Prominent central pulmonary vasculature as before. IMPRESSION:  Bibasilar atelectasis. Stable cardiomegaly with aortic atherosclerosis. Electronically Signed   By: Ashley Royalty M.D.   On: 11/15/2018 22:26    Cardiac Studies   Echo 11/16/18:  Normal LV systolic function. Normal function of the aortic valve. No aortic valve insufficiency.   Patient Profile     73 y.o. 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   Assessment & Plan    1. Severe aortic stenosis: He is now one day post TAVR. Both groins are without hematoma. He has been ambulating in the hallways and feels great. BP stable. Sinus on telemetry with no pauses. Echo with normal LV systolic function with normal function of the aortic valve. He is on ASA and Plavix. Discharge home today. Follow up will be arranged with Nell Range, PA-C in the structural heart clinic.   For questions or updates, please contact [place] Please consult [email or web address] for contact info under     Signed, Lauree Chandler, MD  11/16/2018, 8:15 AM

## 2018-11-16 NOTE — Discharge Summary (Addendum)
Discharge Summary    [name] MRN: [id number]; DOB: [birth year]  Admit date: 11/15/2018 Discharge date: 11/16/2018  Primary Care Provider: [facility] [place]  Primary Cardiologist: Ida Rogue, MD  Primary Electrophysiologist:  None  Valve specialist: Dr. Angelena Form  Discharge Diagnoses    Principal Problem:   Severe aortic stenosis Active Problems:   COPD (chronic obstructive pulmonary disease) (HCC)   Hypothyroidism   Hypertension   Chronic diastolic heart failure (HCC)   Allergies Allergies  Allergen Reactions   Penicillins Hives    Did it involve swelling of the face/tongue/throat, SOB, or low BP? No Did it involve sudden or severe rash/hives, skin peeling, or any reaction on the inside of your mouth or nose? No Did you need to seek medical attention at a hospital or doctor's office? No When did it last happen?childhood allergy If all above answers are "NO", may proceed with cephalosporin use.     Diagnostic Studies/Procedures    TAVR 11/15/2018 Preoperative Diagnosis:      Severe Aortic Stenosis   Postoperative Diagnosis:    Same   Procedure:        Transcatheter Aortic Valve Replacement - Transfemoral Approach             Edwards Sapien 3 THV (size 29 mm, model # [id number], serial # [id number]) _____________   History of Present Illness     The patient is a 73 year old gentleman with history of hypertension, hypothyroidism, remote smoking until 30 years ago with COPD, severe aortic stenosis and chronic diastolic congestive heart failure who is being evaluated for consideration of transcatheter aortic valve replacement. The patient has been followed by Dr. Rockey Situ and was admitted to [facility] in February 2020 with shortness of breath and lower extremity edema. He was felt to have acute diastolic congestive heart failure as well as exacerbation of COPD and tested positive for influenza. He improved with diuresis and was  discharged but said that even when he went home he still had difficulty laying down due to shortness of breath and had a persistent cough which gradually improved. At the present time he said that he gets short of breath with mild exertion and is fatigued. He denies any chest pain or pressure. He has had no dizziness or syncope. He no longer has orthopnea and can lay flat in bed. He has been on oxygen since discharge which he uses around-the-clock. He had an echocardiogram done on 10/10/2018 which showed a trileaflet aortic valve with severe calcification and restricted mobility of the leaflets. The mean gradient across aortic valve was 48 mmHg with a calculated aortic valve area of 0.8 cm. Left ventricular ejection fraction was 50 to 55%. There is no aortic insufficiency and no mitral regurgitation. Cardiac catheterization on 10/13/2018 showed mild nonobstructive coronary disease. PA pressure was moderately elevated at 54/24 with a mean wedge pressure of 25. Right atrial pressure was 12. PA saturation was 78% with a cardiac index of 3.0.   Hospital Course     Consultants: N/A  Patient underwent TAVR with Edwards Sapien 3 THV (size 60mm) using a transfemoral approach.  The procedure was done by Dr. Angelena Form and a Dr. Cyndia Bent.  Postprocedure, patient recovered very well.  Limited echocardiogram showed normal LV function with normal function of the aortic valve.  He will be discharged on aspirin and Plavix.  He is deemed stable for discharge from cardiology perspective.  _____________  Discharge Vitals Blood pressure 134/63, pulse 80, temperature 98.5 F (36.9 C),  temperature source Oral, resp. rate (!) 22, height 6\' 1"  (1.854 m), weight 108.5 kg, SpO2 97 %.  Filed Weights   11/15/18 0610 11/16/18 0500  Weight: 107.1 kg 108.5 kg    Labs & Radiologic Studies    CBC Recent Labs    11/15/18 0951 11/16/18 0246  WBC  --  7.5  HGB 11.9* 11.7*  HCT 35.0* 37.3*  MCV  --  94.4  PLT  --   169*   Basic Metabolic Panel Recent Labs    11/15/18 0922 11/15/18 0951 11/15/18 0958 11/16/18 0246  NA 136 136  --  134*  K 4.0 4.0  --  4.3  CL  --   --   --  94*  CO2  --   --   --  35*  GLUCOSE 120*  --   --  112*  BUN  --   --   --  12  CREATININE  --   --  0.80 0.87  CALCIUM  --   --   --  8.1*  MG  --   --   --  1.8   Liver Function Tests No results for input(s): AST, ALT, ALKPHOS, BILITOT, PROT, ALBUMIN in the last 72 hours. No results for input(s): LIPASE, AMYLASE in the last 72 hours. Cardiac Enzymes No results for input(s): CKTOTAL, CKMB, CKMBINDEX, TROPONINI in the last 72 hours. BNP Invalid input(s): POCBNP D-Dimer No results for input(s): DDIMER in the last 72 hours. Hemoglobin A1C No results for input(s): HGBA1C in the last 72 hours. Fasting Lipid Panel No results for input(s): CHOL, HDL, LDLCALC, TRIG, CHOLHDL, LDLDIRECT in the last 72 hours. Thyroid Function Tests No results for input(s): TSH, T4TOTAL, T3FREE, THYROIDAB in the last 72 hours.  Invalid input(s): FREET3 _____________  Dg Chest 2 View  Result Date: 11/11/2018 CLINICAL DATA:  Pre-admit for TAVR. EXAM: CHEST - 2 VIEW COMPARISON:  CT 10/27/2018. FINDINGS: Mediastinum and hilar structures normal. Heart size normal. Left base atelectasis/scarring again noted. Stable elevation left hemidiaphragm. No pneumothorax. Old right rib fractures again noted. IMPRESSION: 1. Left base atelectasis/scarring again noted. Stable elevation left hemidiaphragm. 2.  No acute cardiopulmonary disease otherwise noted. Electronically Signed   By: Marcello Moores  Register   On: 11/11/2018 10:11   Ct Coronary Morph W/cta Cor Nancy Fetter W/ca W/cm &/or Wo/cm  Addendum Date: 10/27/2018   ADDENDUM REPORT: 10/27/2018 11:51 CLINICAL DATA:  Aortic stenosis EXAM: Cardiac TVR CT TECHNIQUE: The patient was scanned on a Siemens Force 678 slice scanner. A 120 kV retrospective scan was triggered in the descending thoracic aorta at 111 HU's. Gantry  rotation speed was 270 msec's and collimation was .9 mm. No beta blockade or nitro were given. The 3 D data set was reconstructed in 5% intervals of the R-R cycle. Systolic and diastolic phases were analyzed on a dedicated work station using [place], MIP and VT modes. The patient received 80 cc of contrast. FINDINGS: Aortic Valve: Tri leaflet calcified with restricted leaflet motion Aorta: Moderate calcific atherosclerosis with normal arch vessels Semitubular junction: 30 mm Ascending Thoracic Aorta: 33 mm Aortic Arch: 27 mm Descending Thoracic Aorta: 27 mm Sinus of Valsalva Measurements: Non-coronary: 34.2 mm Right - coronary: 33 mm Left - coronary: 33.5 mm Coronary Artery Height above Annulus: Left Main: 14.2 mm above annulus Right Coronary: 15.6 mm above annulus Virtual Basal Annulus Measurements: Maximum/Minimum Diameter: 32.1 mm x 25.6 mm Perimeter: 94 mm Area: 622 mm 2 Coronary Arteries: Sufficient height above annulus for deployment Optimum  Fluoroscopic Angle for Delivery: RAO 10 Caudal 32 degrees IMPRESSION: 1. Tri leaflet AV with annular area 622 mm 2 suitable for a 29 mm Sapiens 3 valve 2. Optimum angiographic angle for deployment RAO 10 Caudal 32 degrees 3.  Normal aortic root size 3.3 cm 4.  Coronary arteries sufficient height above annulus for deployment Sheila Oats Electronically Signed   By: Jenkins Rouge M.D.   On: 10/27/2018 11:51   Result Date: 10/27/2018 EXAM: OVER-READ INTERPRETATION  CT CHEST The following report is an over-read performed by radiologist Dr. Vinnie Langton of [facility] [place] on 10/27/2018. This over-read does not include interpretation of cardiac or coronary anatomy or pathology. The coronary calcium score/coronary CTA interpretation by the cardiologist is attached. COMPARISON:  None. FINDINGS: Extracardiac findings will be described separately under dictation for contemporaneously obtained CTA chest, abdomen and pelvis. IMPRESSION: Please see separate dictation for  contemporaneously obtained CTA chest, abdomen and pelvis dated 10/27/2018 for full description of relevant extracardiac findings. Electronically Signed: By: Vinnie Langton M.D. On: 10/27/2018 11:37   Dg Chest Port 1 View  Result Date: 11/15/2018 CLINICAL DATA:  S/p TAVR using bioprosthetic. EXAM: PORTABLE CHEST 1 VIEW COMPARISON:  11/11/2018 FINDINGS: Stable cardiomegaly with aortic atherosclerosis. Evidence of recent TAVR. Bibasilar atelectasis with chronic elevation of the left hemidiaphragm. Prominent central pulmonary vasculature as before. IMPRESSION: Bibasilar atelectasis. Stable cardiomegaly with aortic atherosclerosis. Electronically Signed   By: Ashley Royalty M.D.   On: 11/15/2018 22:26   Ct Angio Chest Aorta W &/or Wo Contrast  Result Date: 10/27/2018 CLINICAL DATA:  73 year old male with history of severe aortic stenosis. Preprocedural study prior to potential trocar and catheter aortic valve replacement (TAVR) procedure. EXAM: CT ANGIOGRAPHY CHEST, ABDOMEN AND PELVIS TECHNIQUE: Multidetector CT imaging through the chest, abdomen and pelvis was performed using the standard protocol during bolus administration of intravenous contrast. Multiplanar reconstructed images and MIPs were obtained and reviewed to evaluate the vascular anatomy. CONTRAST:  117mL OMNIPAQUE IOHEXOL 350 MG/ML SOLN COMPARISON:  No priors. FINDINGS: CTA CHEST FINDINGS Cardiovascular: Heart size is normal. There is no significant pericardial fluid, thickening or pericardial calcification. There is aortic atherosclerosis, as well as atherosclerosis of the great vessels of the mediastinum and the coronary arteries, including calcified atherosclerotic plaque in the left main, left anterior descending, left circumflex and right coronary arteries. Severe thickening calcification of the aortic valve. Mild calcification of the mitral annulus. Mediastinum/Lymph Nodes: No pathologically enlarged mediastinal or hilar lymph nodes. Esophagus  is unremarkable in appearance. No axillary lymphadenopathy. Lungs/Pleura: 6 x 4 mm (mean diameter 5 mm) right middle lobe subpleural nodule (axial image 56 of series 14). No larger more suspicious appearing pulmonary nodules or masses are noted. No acute consolidative airspace disease. No pleural effusions. Mild diffuse bronchial wall thickening with mild centrilobular and paraseptal emphysema. Scattered areas of subsegmental atelectasis or scarring in the lung bases bilaterally. Musculoskeletal/Soft Tissues: Multiple old healed fractures of the right ribs. There are no aggressive appearing lytic or blastic lesions noted in the visualized portions of the skeleton. CTA ABDOMEN AND PELVIS FINDINGS Hepatobiliary: 2 small subcentimeter low-attenuation lesions noted in the left lobe of the liver, too small to characterize, but statistically likely to represent tiny cysts. No larger more suspicious appearing hepatic lesions. No intra or extrahepatic biliary ductal dilatation. Gallbladder is normal in appearance. Pancreas: No pancreatic mass. No pancreatic ductal dilatation. No pancreatic or peripancreatic fluid or inflammatory changes. Spleen: Unremarkable. Adrenals/Urinary Tract: Bilateral kidneys and bilateral adrenal glands are normal in appearance.  No hydroureteronephrosis. Urinary bladder is normal in appearance. Stomach/Bowel: Normal appearance of the stomach. No pathologic dilatation of small bowel or colon. Normal appendix. Vascular/Lymphatic: Aortic atherosclerosis, without evidence of aneurysm or dissection in the abdominal or pelvic vasculature. Vascular findings and measurements pertinent to potential TAVR procedure, as detailed below. No lymphadenopathy noted in the abdomen or pelvis. Reproductive: Prostate gland and seminal vesicles are unremarkable in appearance. Other: No significant volume of ascites.  No pneumoperitoneum. Musculoskeletal: There are no aggressive appearing lytic or blastic lesions noted  in the visualized portions of the skeleton. VASCULAR MEASUREMENTS PERTINENT TO TAVR: AORTA: Minimal Aortic Diameter-15 x 16 mm Severity of Aortic Calcification-moderate to severe RIGHT PELVIS: Right Common Iliac Artery - Minimal Diameter-10.6 x 8.3 mm Tortuosity-mild Calcification-severe Right External Iliac Artery - Minimal Diameter-9.8 x 6.9 mm Tortuosity-moderate to severe Calcification-minimal Right Common Femoral Artery - Minimal Diameter-9.6 x 6.3 mm Tortuosity-mild Calcification-moderate LEFT PELVIS: Left Common Iliac Artery - Minimal Diameter-10.3 x 7.0 mm Tortuosity-mild Calcification-severe Left External Iliac Artery - Minimal Diameter-9.1 x 8.0 mm Tortuosity-moderate Calcification-minimal Left Common Femoral Artery - Minimal Diameter-9.3 x 6.6 mm Tortuosity-mild Calcification-moderate Review of the MIP images confirms the above findings. IMPRESSION: 1. Vascular findings and measurements pertinent to potential TAVR procedure, as detailed above. 2. Severe thickening calcification of the aortic valve, compatible with the reported clinical history of severe aortic stenosis. 3. Aortic atherosclerosis, in addition to left main and 3 vessel coronary artery disease. 4. 5 mm subpleural nodule in the lateral segment of the right middle lobe, nonspecific, but statistically likely benign. No follow-up needed if patient is low-risk. Non-contrast chest CT can be considered in 12 months if patient is high-risk. This recommendation follows the consensus statement: Guidelines for Management of Incidental Pulmonary Nodules Detected on CT Images: From the Fleischner Society 2017; Radiology 2017; 284:228-243. 5. Additional incidental findings, as above. Electronically Signed   By: Vinnie Langton M.D.   On: 10/27/2018 14:45   Vas US Carotid  Result Date: 11/13/2018 Carotid Arterial Duplex Study Indications: Pre-TAVR. Performing Technologist: Oliver Hum RVT  Examination Guidelines: A complete evaluation includes  B-mode imaging, spectral Doppler, color Doppler, and power Doppler as needed of all accessible portions of each vessel. Bilateral testing is considered an integral part of a complete examination. Limited examinations for reoccurring indications may be performed as noted.  Right Carotid Findings: +----------+--------+-------+--------+--------------------------------+--------+             PSV cm/s EDV     Stenosis Describe                         Comments                       cm/s                                                        +----------+--------+-------+--------+--------------------------------+--------+  CCA Prox   57       7                smooth and heterogenous                    +----------+--------+-------+--------+--------------------------------+--------+  CCA Distal 35       8  smooth and heterogenous                    +----------+--------+-------+--------+--------------------------------+--------+  ICA Prox   41       14               smooth, heterogenous and                                                         calcific                                   +----------+--------+-------+--------+--------------------------------+--------+  ICA Distal 28       8                                                 tortuous  +----------+--------+-------+--------+--------------------------------+--------+  ECA        65       7                                                           +----------+--------+-------+--------+--------------------------------+--------+ +----------+--------+-------+--------+-------------------+             PSV cm/s EDV cms Describe Arm Pressure (mmHG)  +----------+--------+-------+--------+-------------------+  Subclavian 75                                             +----------+--------+-------+--------+-------------------+ +---------+--------+--+--------+--+---------+  Vertebral PSV cm/s 33 EDV cm/s 12 Antegrade  +---------+--------+--+--------+--+---------+   Left Carotid Findings: +----------+--------+--------+--------+-----------------------+--------+             PSV cm/s EDV cm/s Stenosis Describe                Comments  +----------+--------+--------+--------+-----------------------+--------+  CCA Prox   59       6                 smooth and heterogenous           +----------+--------+--------+--------+-----------------------+--------+  CCA Distal 28       4                 smooth and heterogenous           +----------+--------+--------+--------+-----------------------+--------+  ICA Prox   46       16                smooth and heterogenous           +----------+--------+--------+--------+-----------------------+--------+  ICA Distal 46       15                                        tortuous  +----------+--------+--------+--------+-----------------------+--------+  ECA        59  8                                                   +----------+--------+--------+--------+-----------------------+--------+ +----------+--------+--------+--------+-------------------+  Subclavian PSV cm/s EDV cm/s Describe Arm Pressure (mmHG)  +----------+--------+--------+--------+-------------------+             58                                              +----------+--------+--------+--------+-------------------+ +---------+--------+--+--------+-+---------+  Vertebral PSV cm/s 28 EDV cm/s 9 Antegrade  +---------+--------+--+--------+-+---------+  Summary: Right Carotid: Velocities in the right ICA are consistent with a 1-39% stenosis. Left Carotid: Velocities in the left ICA are consistent with a 1-39% stenosis. Vertebrals: Bilateral vertebral arteries demonstrate antegrade flow. *See table(s) above for measurements and observations.  Electronically signed by Harold Barban MD on 11/13/2018 at 8:01:21 AM.    Final    Ct Angio Abd/pel W/ And/or W/o  Result Date: 10/27/2018 CLINICAL DATA:  73 year old male with history of severe aortic stenosis. Preprocedural study prior to  potential trocar and catheter aortic valve replacement (TAVR) procedure. EXAM: CT ANGIOGRAPHY CHEST, ABDOMEN AND PELVIS TECHNIQUE: Multidetector CT imaging through the chest, abdomen and pelvis was performed using the standard protocol during bolus administration of intravenous contrast. Multiplanar reconstructed images and MIPs were obtained and reviewed to evaluate the vascular anatomy. CONTRAST:  146mL OMNIPAQUE IOHEXOL 350 MG/ML SOLN COMPARISON:  No priors. FINDINGS: CTA CHEST FINDINGS Cardiovascular: Heart size is normal. There is no significant pericardial fluid, thickening or pericardial calcification. There is aortic atherosclerosis, as well as atherosclerosis of the great vessels of the mediastinum and the coronary arteries, including calcified atherosclerotic plaque in the left main, left anterior descending, left circumflex and right coronary arteries. Severe thickening calcification of the aortic valve. Mild calcification of the mitral annulus. Mediastinum/Lymph Nodes: No pathologically enlarged mediastinal or hilar lymph nodes. Esophagus is unremarkable in appearance. No axillary lymphadenopathy. Lungs/Pleura: 6 x 4 mm (mean diameter 5 mm) right middle lobe subpleural nodule (axial image 56 of series 14). No larger more suspicious appearing pulmonary nodules or masses are noted. No acute consolidative airspace disease. No pleural effusions. Mild diffuse bronchial wall thickening with mild centrilobular and paraseptal emphysema. Scattered areas of subsegmental atelectasis or scarring in the lung bases bilaterally. Musculoskeletal/Soft Tissues: Multiple old healed fractures of the right ribs. There are no aggressive appearing lytic or blastic lesions noted in the visualized portions of the skeleton. CTA ABDOMEN AND PELVIS FINDINGS Hepatobiliary: 2 small subcentimeter low-attenuation lesions noted in the left lobe of the liver, too small to characterize, but statistically likely to represent tiny cysts. No  larger more suspicious appearing hepatic lesions. No intra or extrahepatic biliary ductal dilatation. Gallbladder is normal in appearance. Pancreas: No pancreatic mass. No pancreatic ductal dilatation. No pancreatic or peripancreatic fluid or inflammatory changes. Spleen: Unremarkable. Adrenals/Urinary Tract: Bilateral kidneys and bilateral adrenal glands are normal in appearance. No hydroureteronephrosis. Urinary bladder is normal in appearance. Stomach/Bowel: Normal appearance of the stomach. No pathologic dilatation of small bowel or colon. Normal appendix. Vascular/Lymphatic: Aortic atherosclerosis, without evidence of aneurysm or dissection in the abdominal or pelvic vasculature. Vascular findings and measurements pertinent to potential TAVR procedure, as detailed below. No lymphadenopathy noted in the abdomen or  pelvis. Reproductive: Prostate gland and seminal vesicles are unremarkable in appearance. Other: No significant volume of ascites.  No pneumoperitoneum. Musculoskeletal: There are no aggressive appearing lytic or blastic lesions noted in the visualized portions of the skeleton. VASCULAR MEASUREMENTS PERTINENT TO TAVR: AORTA: Minimal Aortic Diameter-15 x 16 mm Severity of Aortic Calcification-moderate to severe RIGHT PELVIS: Right Common Iliac Artery - Minimal Diameter-10.6 x 8.3 mm Tortuosity-mild Calcification-severe Right External Iliac Artery - Minimal Diameter-9.8 x 6.9 mm Tortuosity-moderate to severe Calcification-minimal Right Common Femoral Artery - Minimal Diameter-9.6 x 6.3 mm Tortuosity-mild Calcification-moderate LEFT PELVIS: Left Common Iliac Artery - Minimal Diameter-10.3 x 7.0 mm Tortuosity-mild Calcification-severe Left External Iliac Artery - Minimal Diameter-9.1 x 8.0 mm Tortuosity-moderate Calcification-minimal Left Common Femoral Artery - Minimal Diameter-9.3 x 6.6 mm Tortuosity-mild Calcification-moderate Review of the MIP images confirms the above findings. IMPRESSION: 1. Vascular  findings and measurements pertinent to potential TAVR procedure, as detailed above. 2. Severe thickening calcification of the aortic valve, compatible with the reported clinical history of severe aortic stenosis. 3. Aortic atherosclerosis, in addition to left main and 3 vessel coronary artery disease. 4. 5 mm subpleural nodule in the lateral segment of the right middle lobe, nonspecific, but statistically likely benign. No follow-up needed if patient is low-risk. Non-contrast chest CT can be considered in 12 months if patient is high-risk. This recommendation follows the consensus statement: Guidelines for Management of Incidental Pulmonary Nodules Detected on CT Images: From the Fleischner Society 2017; Radiology 2017; 284:228-243. 5. Additional incidental findings, as above. Electronically Signed   By: Vinnie Langton M.D.   On: 10/27/2018 14:45   Disposition   Pt is being discharged home today in good condition.  Follow-up Plans & Appointments    Follow-up Information    Eileen Stanford, PA-C. Go on 11/23/2018.   Specialties:  Cardiology, Radiology Why:  @ 1:30pm, this will be done via phone call or webex. There are instuctions in the chart on how to sign up for this.  Contact information: [address] [place] [address] [phone]            Discharge Medications   Allergies as of 11/16/2018      Reactions   Penicillins Hives   Did it involve swelling of the face/tongue/throat, SOB, or low BP? No Did it involve sudden or severe rash/hives, skin peeling, or any reaction on the inside of your mouth or nose? No Did you need to seek medical attention at a hospital or doctor's office? No When did it last happen?childhood allergy If all above answers are "NO", may proceed with cephalosporin use.      Medication List    TAKE these medications   albuterol 108 (90 Base) MCG/ACT inhaler Commonly known as:  PROVENTIL HFA;VENTOLIN HFA Inhale 1-2 puffs  into the lungs every 6 (six) hours as needed for wheezing or shortness of breath.   aspirin 81 MG EC tablet Take 1 tablet (81 mg total) by mouth daily.   atorvastatin 20 MG tablet Commonly known as:  LIPITOR Take 1 tablet (20 mg total) by mouth daily at 6 PM.   clopidogrel 75 MG tablet Commonly known as:  PLAVIX Take 1 tablet (75 mg total) by mouth daily with breakfast. Start taking on:  November 17, 2018   furosemide 40 MG tablet Commonly known as:  Lasix Take 1 tablet (40 mg total) by mouth daily.   levothyroxine 50 MCG tablet Commonly known as:  SYNTHROID, LEVOTHROID Take 50 mcg by mouth  daily before breakfast.   vitamin B-12 1000 MCG tablet Commonly known as:  CYANOCOBALAMIN Take 1,000 mcg by mouth daily.   Vitamin D-1000 Max St 25 MCG (1000 UT) tablet Generic drug:  Cholecalciferol Take 1,000 Units by mouth daily.        Acute coronary syndrome (MI, NSTEMI, STEMI, etc) this admission?: No.    Outstanding Labs/Studies   N/A  Duration of Discharge Encounter   Greater than 30 minutes including physician time.  Hilbert Corrigan, PA 11/16/2018, 11:47 AM   I have personally seen and examined this patient. I agree with the assessment and plan as outlined above.  Please see my note from earlier this am. Doing well post TAVR. D/c home today.   Lauree Chandler 11/16/2018  1:30 PM

## 2018-11-17 ENCOUNTER — Telehealth: Payer: Self-pay

## 2018-11-17 MED FILL — Potassium Chloride Inj 2 mEq/ML: INTRAVENOUS | Qty: 40 | Status: AC

## 2018-11-17 MED FILL — Heparin Sodium (Porcine) Inj 1000 Unit/ML: INTRAMUSCULAR | Qty: 30 | Status: AC

## 2018-11-17 MED FILL — Magnesium Sulfate Inj 50%: INTRAMUSCULAR | Qty: 10 | Status: AC

## 2018-11-17 NOTE — Telephone Encounter (Signed)
Patient contacted regarding discharge from [facility] on 11/16/2018.  Patient understands to follow up with provider Angelena Form PA-C on 11/23/2018 at 1:30 PM for Virtual Visit by Telephone.  The pt does not have a computer.  Patient understands discharge instructions? yes Patient understands medications and regiment? yes Patient understands to bring all medications to this visit? N/A, meds will be reviewed over the phone  The pt is doing well today.  The pt did have a complaint about how his wife was treated when she tried to go back to the pre-surgery area when the pt was called by staff.  He said the staff member was very rude and ugly toward his wife and felt that this interaction was not professional. The pt is aware of restrictions in place at the hospital due to Covid-19 but he felt the entire interaction could have been handled differently.  I advised him that he will receive a survey from the hospital and to please complete this survey and include his concerns. Pt agreed.

## 2018-11-21 ENCOUNTER — Telehealth: Payer: Self-pay | Admitting: Physician Assistant

## 2018-11-21 NOTE — Telephone Encounter (Signed)
Spoke with Kenneth Larsen he does not have a mobile phone or computer, can not sign up for Mychart at this time.

## 2018-11-22 ENCOUNTER — Encounter: Payer: Medicare Other | Admitting: Physician Assistant

## 2018-11-22 ENCOUNTER — Other Ambulatory Visit: Payer: Self-pay

## 2018-11-22 ENCOUNTER — Telehealth (INDEPENDENT_AMBULATORY_CARE_PROVIDER_SITE_OTHER): Payer: Medicare Other | Admitting: Physician Assistant

## 2018-11-22 VITALS — BP 126/61 | HR 80 | Wt 228.0 lb

## 2018-11-22 DIAGNOSIS — Z952 Presence of prosthetic heart valve: Secondary | ICD-10-CM

## 2018-11-22 DIAGNOSIS — J449 Chronic obstructive pulmonary disease, unspecified: Secondary | ICD-10-CM | POA: Diagnosis not present

## 2018-11-22 DIAGNOSIS — R911 Solitary pulmonary nodule: Secondary | ICD-10-CM

## 2018-11-22 DIAGNOSIS — I10 Essential (primary) hypertension: Secondary | ICD-10-CM

## 2018-11-22 MED ORDER — CLINDAMYCIN HCL 300 MG PO CAPS: 600.0000 mg | ORAL_CAPSULE | ORAL | Status: AC

## 2018-11-22 NOTE — Progress Notes (Signed)
entered in error. This encounter was created in error - please disregard.

## 2018-11-22 NOTE — Patient Instructions (Addendum)
Hello [name],   It was so nice to talk to you on the phone today. I am so glad you are doing so well. I just wanted to send you a recap of our discussion.   Please remember you have to take antibiotics prior to any dental work including cleanings (medicine called Clindamycin). Please continue on your home oxygen and all your same medications at this time.   Your 1 month echo and office visit is scheduled on 4/29. We will call you to convert this to an E-visit depending on the course of the Covid-19 pandemic.   All appointment details are attached to this letter in your "after visit summary."  Please call us with any questions or concerns you may have and please stay safe during these uncertain times.  Nell Range

## 2018-11-22 NOTE — Progress Notes (Signed)
HEART AND VASCULAR CENTER   MULTIDISCIPLINARY HEART VALVE TEAM   Evaluation Performed:  Follow-up visit  This visit type was conducted due to national recommendations for restrictions regarding the COVID-19 Pandemic (e.g. social distancing).  This format is felt to be most appropriate for this patient at this time.  All issues noted in this document were discussed and addressed.  No physical exam was performed (except for noted visual exam findings with Telehealth visits).  The patient has consented to conduct a Telehealth visit and understands insurance will be billed.   Date:  11/22/2018   ID:  Kenneth Larsen, DOB 1945/12/22, MRN [id number]  Patient Location:  [place] [address]   Provider location:   [address] [place], [place] [address]  PCP:  [facility] [place]  Cardiologist:  Ida Rogue, MD / Dr. Angelena Form & Dr. Cyndia Bent (TAVR)  Chief Complaint:  [facility] s/p TAVR    History of Present Illness:    Kenneth Larsen is a 73 y.o. 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    The patient does not symptoms concerning for COVID-19 infection (fever, chills, cough, or new SHORTNESS OF BREATH).   The patient has been followed by Dr. Rockey Situ and was admitted to [facility] in February 2020 with shortness of breath and lower extremity edema. He was felt to have acute diastolic congestive heart failure as well as exacerbation of COPD and tested positive for influenza. He improved with diuresis and was discharged but said that even when he went home he still had difficulty laying down due to shortness of breath and had a persistent cough which gradually improved. Echocardiogram on 10/10/2018 showed a trileaflet aortic valve with severe calcification and restricted mobility of the leaflets. The mean gradient across  aortic valve was 48 mmHg with a calculated aortic valve area of 0.8 cm. Left ventricular ejection fraction was 50 to 55%. There is no aortic insufficiency and no mitral regurgitation. Cardiac catheterization on 10/13/2018 showed mild nonobstructive coronary disease. PA pressure was moderately elevated at 54/24 with a mean wedge pressure of 25. Right atrial pressure was 12. PA saturation was 78% with a cardiac index of 3.0.  He was evaluated by the multidisciplinary valve team and underwent successful TAVR with Edwards Sapien 3 THV (size 71mm) using a transfemoral approach. Post op echo showed EF 50-55%, normally functioning TAVR with mean gradient of 11 mm Hg and no PVL. He was discharged on POD1 with aspirin and plavix.   Today he presents for a telehealth visit. No CP or SOB. No LE edema, orthopnea or PND. No dizziness or syncope. No blood in stool or urine. No palpitations. He is down 6 lbs since leaving the hospital. He has some soreness in his calves but no swelling and no rash. That has resolved now. He has been outside doing some walking but his wife is now allowing him to do some yard work.   Prior CV studies:   The following studies were reviewed today:  TAVR OPERATIVE NOTE   Date of Procedure:                11/15/2018  Preoperative Diagnosis:      Severe Aortic Stenosis   Postoperative Diagnosis:    Same   Procedure:  Transcatheter Aortic Valve Replacement - Percutaneous Left Transfemoral Approach             Edwards Sapien 3 THV (size 29 mm, model # 9600TFX, serial # [id number])              Co-Surgeons:                        Gaye Pollack, MD and Lauree Chandler, MD   Anesthesiologist:                  Wilfrid Lund, MD  Echocardiographer:              Bertrum Sol, MD  Pre-operative Echo Findings: ? Severe aortic stenosis ? Normal left ventricular systolic function  Post-operative Echo Findings: ? No paravalvular leak ? Normal left ventricular  systolic function  ___________________  Echo 11/16/18 IMPRESSIONS  1. The left ventricle has low normal systolic function, with an ejection fraction of 50-55%. The cavity size was normal. Left ventricular diastolic function could not be evaluated.  2. The right ventricle has normal systolic function. The cavity was normal. There is no increase in right ventricular wall thickness.  3. A 58mm Edwards Sapien bioprosthetic aortic valve (TAVR) valve is present in the aortic position. Procedure Date: 11/15/2018 Normal aortic valve prosthesis. Echo findings shows no evidence of rocking or dehiscence of the aortic prosthesis.  4. Aortic valve prosthesis not well visualized in short axis. Suboptimal image quality, no definite prosthetic or periprosthetic regurgitation. Mean systolic prosthetic gradient 11 mmHg.   Past Medical History:  Diagnosis Date   Arthritis    Cancer ([place])    2010-melanoma   Chronic diastolic (congestive) heart failure (HCC)    COPD (chronic obstructive pulmonary disease) ([place])    Hypertension    Hypothyroidism    Severe aortic stenosis    Past Surgical History:  Procedure Laterality Date   CARDIAC CATHETERIZATION     RIGHT/LEFT HEART CATH AND CORONARY ANGIOGRAPHY N/A 10/13/2018   Procedure: RIGHT/LEFT HEART CATH AND CORONARY ANGIOGRAPHY;  Surgeon: Nelva Bush, MD;  Location: [place] [facility];  Service: Cardiovascular;  Laterality: N/A;   TEE WITHOUT CARDIOVERSION N/A 11/15/2018   Procedure: TRANSESOPHAGEAL ECHOCARDIOGRAM (TEE);  Surgeon: Burnell Blanks, MD;  Location: [place] [facility];  Service: Open Heart Surgery;  Laterality: N/A;   TRANSCATHETER AORTIC VALVE REPLACEMENT, TRANSFEMORAL  11/15/2018   TRANSCATHETER AORTIC VALVE REPLACEMENT, TRANSFEMORAL N/A 11/15/2018   Procedure: TRANSCATHETER AORTIC VALVE REPLACEMENT, TRANSFEMORAL;  Surgeon: Burnell Blanks, MD;  Location: [place] [facility];  Service: Open Heart Surgery;  Laterality:  N/A;   VASECTOMY       No outpatient medications have been marked as taking for the 11/22/18 encounter (Appointment) with Eileen Stanford, PA-C.     Allergies:   Penicillins   Social History   Tobacco Use   Smoking status: Former Smoker    Packs/day: 1.00    Years: 25.00    Pack years: 25.00    Types: Cigarettes   Smokeless tobacco: Never Used  Substance Use Topics   Alcohol use: Never    Frequency: Never   Drug use: Never     Family Hx: The patient's family history includes Cancer in his father.  ROS:   Please see the history of present illness.    All other systems reviewed and are negative.   Labs/Other Tests and Data Reviewed:    Recent Labs: 10/08/2018: TSH 0.807 11/11/2018: ALT  12; B Natriuretic Peptide 324.1 11/16/2018: BUN 12; Creatinine, Ser 0.87; Hemoglobin 11.7; Magnesium 1.8; Platelets 138; Potassium 4.3; Sodium 134   Recent Lipid Panel Lab Results  Component Value Date/Time   CHOL 117 10/14/2018 06:27 AM   TRIG 29 10/14/2018 06:27 AM   HDL 46 10/14/2018 06:27 AM   CHOLHDL 2.5 10/14/2018 06:27 AM   LDLCALC 65 10/14/2018 06:27 AM    Wt Readings from Last 3 Encounters:  11/16/18 239 lb 3.2 oz (108.5 kg)  11/11/18 236 lb 1.6 oz (107.1 kg)  11/09/18 229 lb (103.9 kg)     Exam:    Today's Vitals   11/22/18 1408  BP: 126/61  Pulse: 80  SpO2: 96%  Weight: 228 lb (103.4 kg)   Body mass index is 30.08 kg/m.  Not completed as visit conducted over the phone  ASSESSMENT & PLAN:    Severe AS s/p TAVR: doing excellent. Groin sites are healing well with no issues. SBE prophylaxis discussed; he has partial dentures but does still get cleanings. He understands he will need clindamycin (PCN allergy) but didn't want [place] to call in the RX now. No ECG completed at this was an e-visit, but HR in 80s and no s/s of heart block   HTN: Bp has been in good range.   COPD: stable. He was discharged with home 02 after [facility] admission in Feb. He wonders if  he needs to remain on 02. He says his 02 sats go into 80s when not on it. I have asked him to remain on it for now and follow 02 sats at home. We will review log at next visit.   Pulmonary nodule: 5 mm subpleural nodule in the lateral segment of the right middle lobe, nonspecific, but statistically likely benign. No follow-up needed if patient is low-risk. Non-contrast chest CT can be considered in 12 months if patient is high-risk. The patient has a previous smoking history, so we will set up a CT in 10/2019.  COVID-19 Education: The signs and symptoms of COVID-19 were discussed with the patient and how to seek care for testing (follow up with PCP or arrange E-visit).  The importance of social distancing was discussed today.  Patient Risk:   After full review of this patients clinical status, I feel that they are at least moderate risk at this time.  Time:   Today, I have spent 21 minutes with the patient with telehealth technology discussing post surgical recovery, symptoms and instructions going forward.     Medication Adjustments/Labs and Tests Ordered: Current medicines are reviewed at length with the patient today.  Concerns regarding medicines are outlined above.  Tests Ordered: No orders of the defined types were placed in this encounter.  Medication Changes: No orders of the defined types were placed in this encounter.   Disposition:  4/29 for echo and follow up with me.   Signed, Angelena Form, PA-C  11/22/2018 12:22 PM    [facility] [place], [place], [place]  [address] Phone: [phone]; Fax: [phone]

## 2018-11-23 ENCOUNTER — Encounter: Payer: Medicare Other | Admitting: Physician Assistant

## 2018-12-16 ENCOUNTER — Other Ambulatory Visit: Payer: Self-pay | Admitting: Physician Assistant

## 2018-12-16 ENCOUNTER — Other Ambulatory Visit: Payer: Self-pay

## 2018-12-16 ENCOUNTER — Telehealth (INDEPENDENT_AMBULATORY_CARE_PROVIDER_SITE_OTHER): Payer: Medicare Other | Admitting: Physician Assistant

## 2018-12-16 VITALS — BP 137/55 | HR 78 | Wt 230.0 lb

## 2018-12-16 DIAGNOSIS — Z952 Presence of prosthetic heart valve: Secondary | ICD-10-CM | POA: Diagnosis not present

## 2018-12-16 DIAGNOSIS — I10 Essential (primary) hypertension: Secondary | ICD-10-CM

## 2018-12-16 DIAGNOSIS — J449 Chronic obstructive pulmonary disease, unspecified: Secondary | ICD-10-CM

## 2018-12-16 DIAGNOSIS — R918 Other nonspecific abnormal finding of lung field: Secondary | ICD-10-CM

## 2018-12-16 DIAGNOSIS — R911 Solitary pulmonary nodule: Secondary | ICD-10-CM

## 2018-12-16 DIAGNOSIS — Z7189 Other specified counseling: Secondary | ICD-10-CM

## 2018-12-16 NOTE — Patient Instructions (Signed)
Hello Mr Petties,   It was so nice to talk to you on the phone today. I am so glad you are doing so well. I just wanted to send you a recap of our discussion.   Please continue taking antibiotics prior to any dental work including cleanings (clindamycin- this has not been called into your pharmacy yet). You can discontinue plavix after 6 months of therapy (around 05/18/19) or when your pills run out after refilling your 90 day supply once. Please continue on aspirin 81 mg indefinitely.   Your 1 month echo will rescheduled to sometime in July or August with an office visit with Dr. Rockey Situ. I am waiting to hear back from the [place] office on when this can be scheduled.   I will see you back in 1 years time with an echo.   All appointment details are attached to this letter in your "after visit summary."  Please call us with any questions or concerns you may have and please stay safe during these uncertain times.  Nell Range

## 2018-12-16 NOTE — Progress Notes (Signed)
HEART AND VASCULAR CENTER   MULTIDISCIPLINARY HEART VALVE TEAM     Virtual Visit via Telephone Note   This visit type was conducted due to national recommendations for restrictions regarding the COVID-19 Pandemic (e.g. social distancing) in an effort to limit this patient's exposure and mitigate transmission in our community.  Due to his co-morbid illnesses, this patient is at least at moderate risk for complications without adequate follow up.  This format is felt to be most appropriate for this patient at this time.  The patient did not have access to video technology/had technical difficulties with video requiring transitioning to audio format only (telephone).  All issues noted in this document were discussed and addressed.  No physical exam could be performed with this format.  Please refer to the patient's chart for his  consent to telehealth for [facility].   Evaluation Performed:  Follow-up visit  Date:  12/16/2018   ID:  Kenneth, Larsen Jun 02, 1946, MRN [id number]  Patient Location: Home Provider Location: Office  PCP:  [facility] [place]  Cardiologist:  Ida Rogue, MD / Dr. Angelena Form & Dr. Cyndia Bent (TAVR) Electrophysiologist:  None   Chief Complaint:  1 month s/p TAVR   History of Present Illness:    Kenneth Larsen is a 73 y.o. 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    The patient does not have symptoms concerning for COVID-19 infection (fever, chills, cough, or new shortness of breath).   The patient has been followed by Dr. Rockey Situ and was admitted to [facility] in February 2020 with shortness of breath and lower extremity edema. He was felt to have acute diastolic congestive heart failure as well as exacerbation of COPD and tested positive for influenza. He improved with diuresis and was discharged but said that  even when he went home he still had difficulty laying down due to shortness of breath and had a persistent cough which gradually improved. Echocardiogram on 10/10/2018 showed a trileaflet aortic valve with severe calcification and restricted mobility of the leaflets. The mean gradient across aortic valve was 48 mmHg with a calculated aortic valve area of 0.8 cm. Left ventricular ejection fraction was 50 to 55%. There is no aortic insufficiency and no mitral regurgitation. Cardiac catheterization on 10/13/2018 showed mild nonobstructive coronary disease.   He was evaluated by the multidisciplinary valve team and underwent successful TAVR withEdwards Sapien 3 THV (size 49mm)using a transfemoral approach.Post op echo showed EF 50-55%, normally functioning TAVR with mean gradient of 11 mm Hg and no PVL. He was discharged on POD1 with aspirin and plavix.   Today he presents for a telehealth visit. No CP or SOB. No LE edema, orthopnea or PND. No dizziness or syncope. No blood in stool or urine. No palpitations. He has been working out in the yard with no issues. He has some shortness of breath with moderate activity like heavy lifting.     Past Medical History:  Diagnosis Date  . Arthritis   . Cancer ([place])    2010-melanoma  . Chronic diastolic (congestive) heart failure ([place])   . COPD (chronic obstructive pulmonary disease) ([place])   . Hypertension   . Hypothyroidism   . Severe aortic stenosis    Past Surgical History:  Procedure Laterality Date  . CARDIAC CATHETERIZATION    . RIGHT/LEFT HEART CATH AND CORONARY ANGIOGRAPHY  N/A 10/13/2018   Procedure: RIGHT/LEFT HEART CATH AND CORONARY ANGIOGRAPHY;  Surgeon: Nelva Bush, MD;  Location: [place] [facility];  Service: Cardiovascular;  Laterality: N/A;  . TEE WITHOUT CARDIOVERSION N/A 11/15/2018   Procedure: TRANSESOPHAGEAL ECHOCARDIOGRAM (TEE);  Surgeon: Burnell Blanks, MD;  Location: [place] [facility];  Service: Open Heart Surgery;   Laterality: N/A;  . TRANSCATHETER AORTIC VALVE REPLACEMENT, TRANSFEMORAL  11/15/2018  . TRANSCATHETER AORTIC VALVE REPLACEMENT, TRANSFEMORAL N/A 11/15/2018   Procedure: TRANSCATHETER AORTIC VALVE REPLACEMENT, TRANSFEMORAL;  Surgeon: Burnell Blanks, MD;  Location: [place] [facility];  Service: Open Heart Surgery;  Laterality: N/A;  . VASECTOMY       Current Meds  Medication Sig  . albuterol (PROVENTIL HFA;VENTOLIN HFA) 108 (90 Base) MCG/ACT inhaler Inhale 1-2 puffs into the lungs every 6 (six) hours as needed for wheezing or shortness of breath.  Marland Kitchen aspirin EC 81 MG EC tablet Take 1 tablet (81 mg total) by mouth daily.  Marland Kitchen atorvastatin (LIPITOR) 20 MG tablet Take 1 tablet (20 mg total) by mouth daily at 6 PM.  . Cholecalciferol (VITAMIN D-1000 MAX ST) 25 MCG (1000 UT) tablet Take 1,000 Units by mouth daily.  . clopidogrel (PLAVIX) 75 MG tablet Take 1 tablet (75 mg total) by mouth daily with breakfast.  . furosemide (LASIX) 40 MG tablet Take 1 tablet (40 mg total) by mouth daily.  Marland Kitchen levothyroxine (SYNTHROID, LEVOTHROID) 50 MCG tablet Take 50 mcg by mouth daily before breakfast.   . vitamin B-12 (CYANOCOBALAMIN) 1000 MCG tablet Take 1,000 mcg by mouth daily.     Allergies:   Penicillins   Social History   Tobacco Use  . Smoking status: Former Smoker    Packs/day: 1.00    Years: 25.00    Pack years: 25.00    Types: Cigarettes  . Smokeless tobacco: Never Used  Substance Use Topics  . Alcohol use: Never    Frequency: Never  . Drug use: Never     Family Hx: The patient's family history includes Cancer in his father.  ROS:   Please see the history of present illness.    All other systems reviewed and are negative.   Prior CV studies:   The following studies were reviewed today:  TAVR OPERATIVE NOTE   Date of Procedure:11/15/2018  Preoperative Diagnosis:Severe Aortic Stenosis   Postoperative Diagnosis:Same   Procedure:    Transcatheter Aortic Valve Replacement - PercutaneousLeftTransfemoral Approach Edwards Sapien 3 THV (size 33mm, model # 9600TFX, serial # [id number])  Co-Surgeons:Bryan Alveria Apley, MD and Lauree Chandler, MD   Anesthesiologist:K. Smith Robert, MD  Echocardiographer:M. Croitoru, MD  Pre-operative Echo Findings: ? Severe aortic stenosis ? Normalleft ventricular systolic function  Post-operative Echo Findings: ? Noparavalvular leak ? Normalleft ventricular systolic function  ___________________  Echo 11/16/18 IMPRESSIONS 1. The left ventricle has low normal systolic function, with an ejection fraction of 50-55%. The cavity size was normal. Left ventricular diastolic function could not be evaluated. 2. The right ventricle has normal systolic function. The cavity was normal. There is no increase in right ventricular wall thickness. 3. A 75mm Edwards Sapien bioprosthetic aortic valve (TAVR) valve is present in the aortic position. Procedure Date: 11/15/2018 Normal aortic valve prosthesis. Echo findings shows no evidence of rocking or dehiscence of the aortic prosthesis. 4. Aortic valve prosthesis not well visualized in short axis. Suboptimal image quality, no definite prosthetic or periprosthetic regurgitation. Mean systolic prosthetic gradient 11 mmHg.   Labs/Other Tests and Data Reviewed:    EKG:  No ECG reviewed.  Recent Labs: 10/08/2018: TSH 0.807 11/11/2018: ALT 12; B Natriuretic Peptide 324.1 11/16/2018: BUN 12; Creatinine, Ser 0.87; Hemoglobin 11.7; Magnesium 1.8; Platelets 138; Potassium 4.3; Sodium 134   Recent Lipid Panel Lab Results  Component Value Date/Time   CHOL 117 10/14/2018 06:27 AM   TRIG 29 10/14/2018 06:27 AM   HDL 46 10/14/2018 06:27 AM   CHOLHDL 2.5 10/14/2018 06:27 AM   LDLCALC 65 10/14/2018 06:27 AM    Wt Readings from Last 3 Encounters:  12/16/18 230 lb (104.3 kg)   11/22/18 228 lb (103.4 kg)  11/16/18 239 lb 3.2 oz (108.5 kg)     Objective:    Vital Signs:  BP (!) 137/55   Pulse 78   Wt 230 lb (104.3 kg)   SpO2 94%   BMI 30.34 kg/m    No exam as visit conducted over phone   ASSESSMENT & PLAN:    Severe AS s/p TAVR: echo has been pushed out due to covid 19. He has NYHA class II symptoms. SBE prophylaxis discussed; he understands he will need clindamycin but asked that it not be called in at this time. Plavix can be discontinued after 6 months of therapy (04/2019). I have sent a message to the [place] office to get an echo and OV with Dr. Rockey Situ set up for July or August.   HTN: BP stable. Continue current medications.   COPD: stable. Still wearing 02 but would like to come off it. His 02 sats at rest are in 90s but does drop into 80s with activity. I have asked him to continue wearing his 02 with activity.   Pulmonary nodule: 5 mm subpleural nodule in the lateral segment of the right middle lobe, nonspecific, but statistically likely benign. No follow-up needed if patient is low-risk. Non-contrast chest CT can be considered in 12 months if patient is high-risk. The patient has a previous smoking history, so we will set up a CT in 10/2019 before echo and OV.   COVID-19 Education: the signs and symptoms of COVID-19 were discussed with the patient and how to seek care for testing (follow up with PCP or arrange E-visit).  The importance of social distancing was discussed today.  Time:   Today, I have spent 25 minutes with the patient with telehealth technology discussing the above problems.     Medication Adjustments/Labs and Tests Ordered: Current medicines are reviewed at length with the patient today.  Concerns regarding medicines are outlined above.   Tests Ordered: CT CHEST  ECHO  Medication Changes: No orders of the defined types were placed in this encounter.   Disposition:  Follow up in 4 month(s) Dr. Rockey Situ with echo    Signed, Angelena Form, PA-C  12/16/2018 12:02 PM    [place]

## 2018-12-20 NOTE — Progress Notes (Signed)
Can we confirm he has echo in [place] in July or August for TAVR

## 2018-12-21 ENCOUNTER — Other Ambulatory Visit (HOSPITAL_COMMUNITY): Payer: Medicare Other

## 2018-12-21 ENCOUNTER — Ambulatory Visit: Payer: Medicare Other | Admitting: Physician Assistant

## 2018-12-22 ENCOUNTER — Telehealth: Payer: Self-pay | Admitting: Cardiovascular Disease

## 2018-12-22 NOTE — Telephone Encounter (Signed)
5/5 At 9 am

## 2018-12-22 NOTE — Telephone Encounter (Signed)
L MOM TO SCHEDULE ECHOCARDIOGRAM. RECALL ENTERED FOR DR [place] July, EARLY AUGUSUST

## 2018-12-22 NOTE — Progress Notes (Signed)
Needs echo July/aug 2020, not 2021 Then follow up with me after echo this year Please see [place] office note A/P

## 2018-12-23 NOTE — Progress Notes (Signed)
My understanding from reading the note from [facility] is echo and follow-up with me in August 2020 We can probably cancel the one next week, seems to early

## 2018-12-23 NOTE — Progress Notes (Signed)
Dr. Rockey Situ,   He already has one scheduled for 12/27/18, scheduled must have received the order from [place] out of the work que and set it up.  Do we cancel for 5/5 and r/s to summer or leave on 12/27/18?  Thanks!

## 2018-12-26 NOTE — Progress Notes (Signed)
Patient was rescheduled for 04/04/2019 and had no further questions.

## 2018-12-26 NOTE — Progress Notes (Signed)
Scheduling,  Please contact the patient and cancel his echo for tomorrow. This will need to be done in August with a follow up with Dr. Rockey Situ just after.   Thank you!

## 2018-12-26 NOTE — Progress Notes (Signed)
Spoke with Anderson Malta in scheduling and she did leave voicemail message for patient to please call back to reschedule this appointment.

## 2018-12-27 ENCOUNTER — Other Ambulatory Visit: Payer: Medicare Other

## 2019-02-10 ENCOUNTER — Encounter: Payer: Self-pay | Admitting: Thoracic Surgery (Cardiothoracic Vascular Surgery)

## 2019-02-15 ENCOUNTER — Encounter: Payer: Self-pay | Admitting: Thoracic Surgery (Cardiothoracic Vascular Surgery)

## 2019-02-27 ENCOUNTER — Other Ambulatory Visit (HOSPITAL_COMMUNITY): Payer: Medicare Other

## 2019-04-04 ENCOUNTER — Other Ambulatory Visit: Payer: Self-pay

## 2019-04-04 ENCOUNTER — Ambulatory Visit (INDEPENDENT_AMBULATORY_CARE_PROVIDER_SITE_OTHER): Payer: Medicare Other

## 2019-04-04 DIAGNOSIS — Z952 Presence of prosthetic heart valve: Secondary | ICD-10-CM | POA: Diagnosis not present

## 2019-04-04 MED ORDER — PERFLUTREN LIPID MICROSPHERE
1.0000 mL | INTRAVENOUS | Status: AC | PRN
  Administered 2019-04-04: 2 mL via INTRAVENOUS

## 2019-07-13 IMAGING — CR CHEST - 2 VIEW
2 series · 2 of 2 positions shown · non-contrast
Comparison: CT 10/27/2018.

CLINICAL DATA: Pre-admit for TAVR.

EXAM:
CHEST - 2 VIEW

[w chest pa]
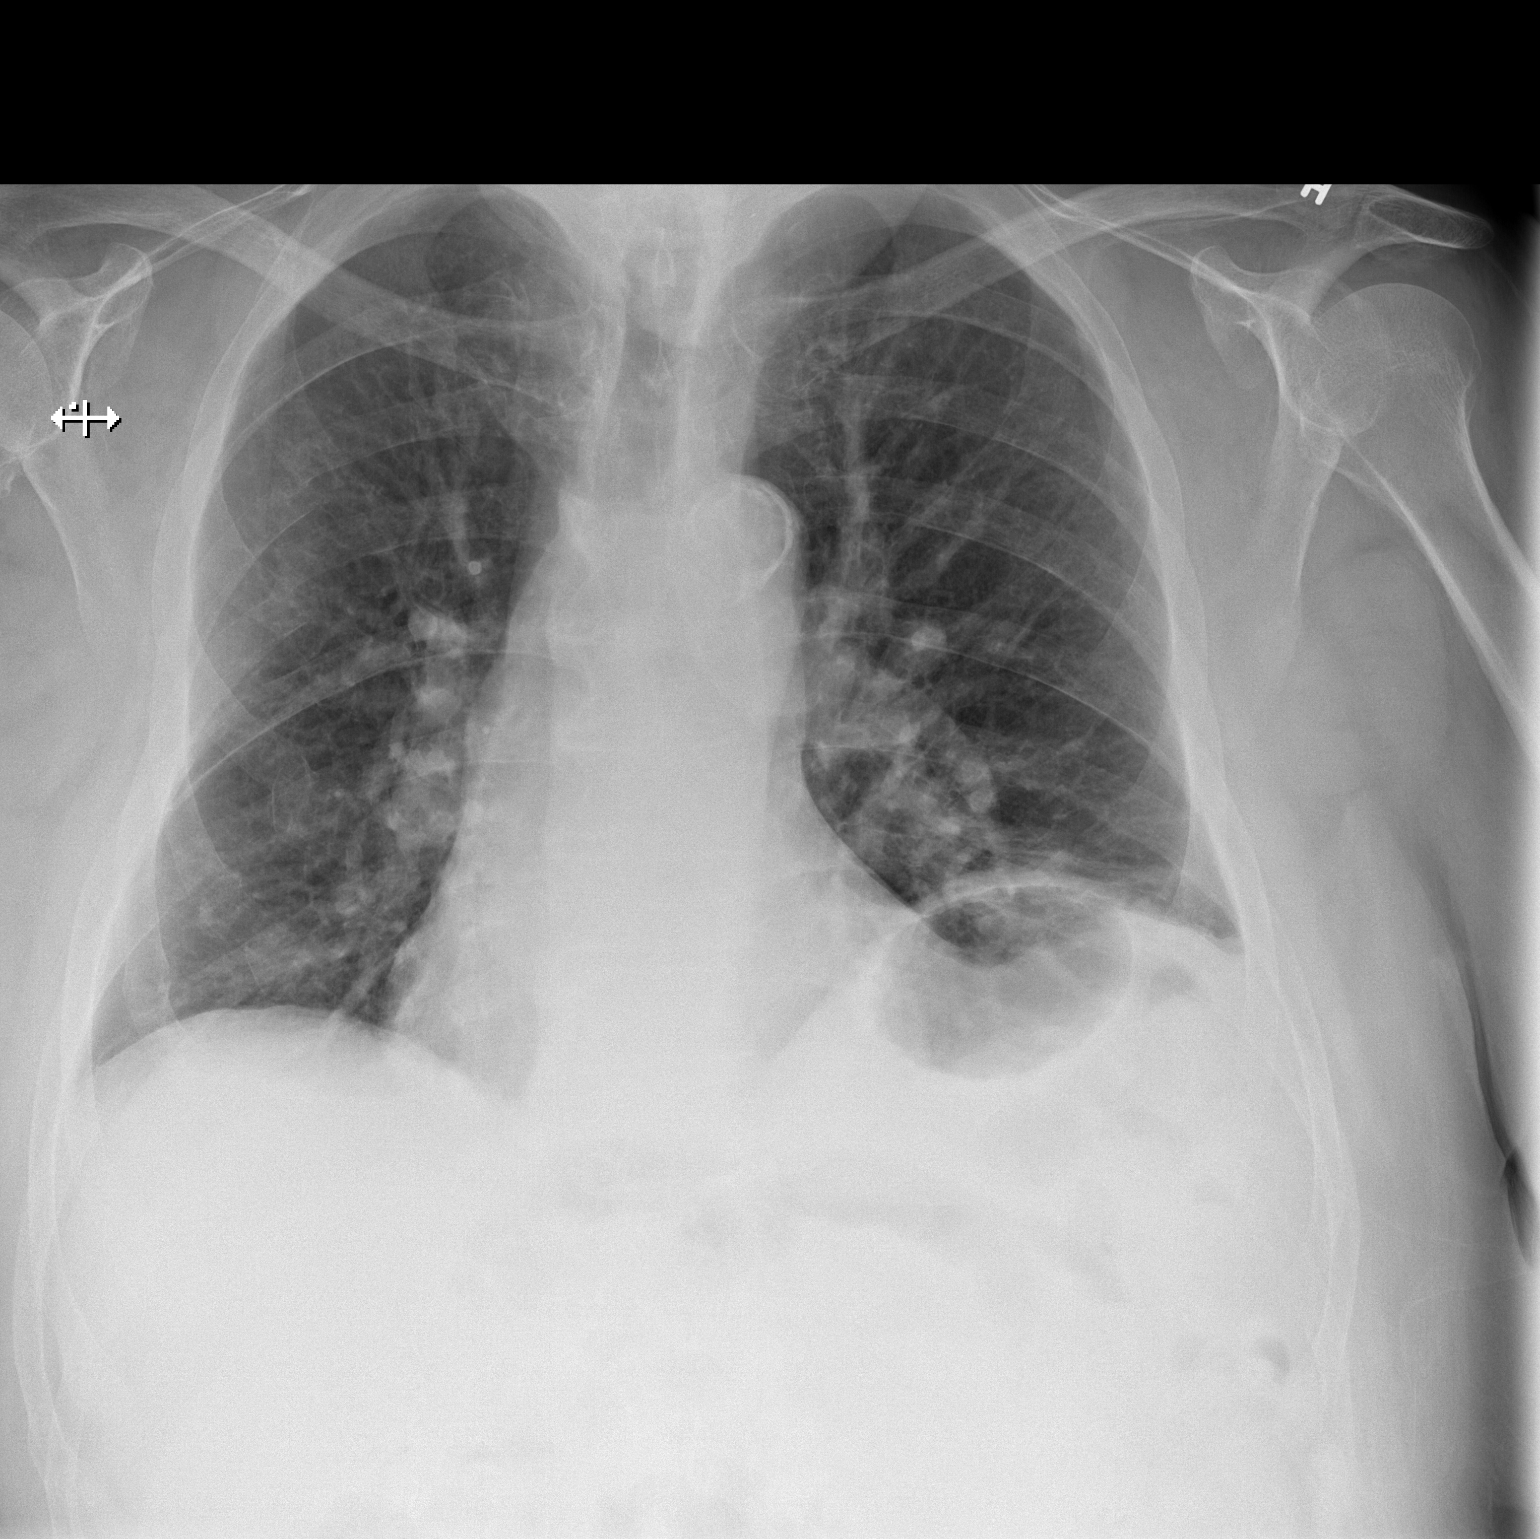

[w chest lat]
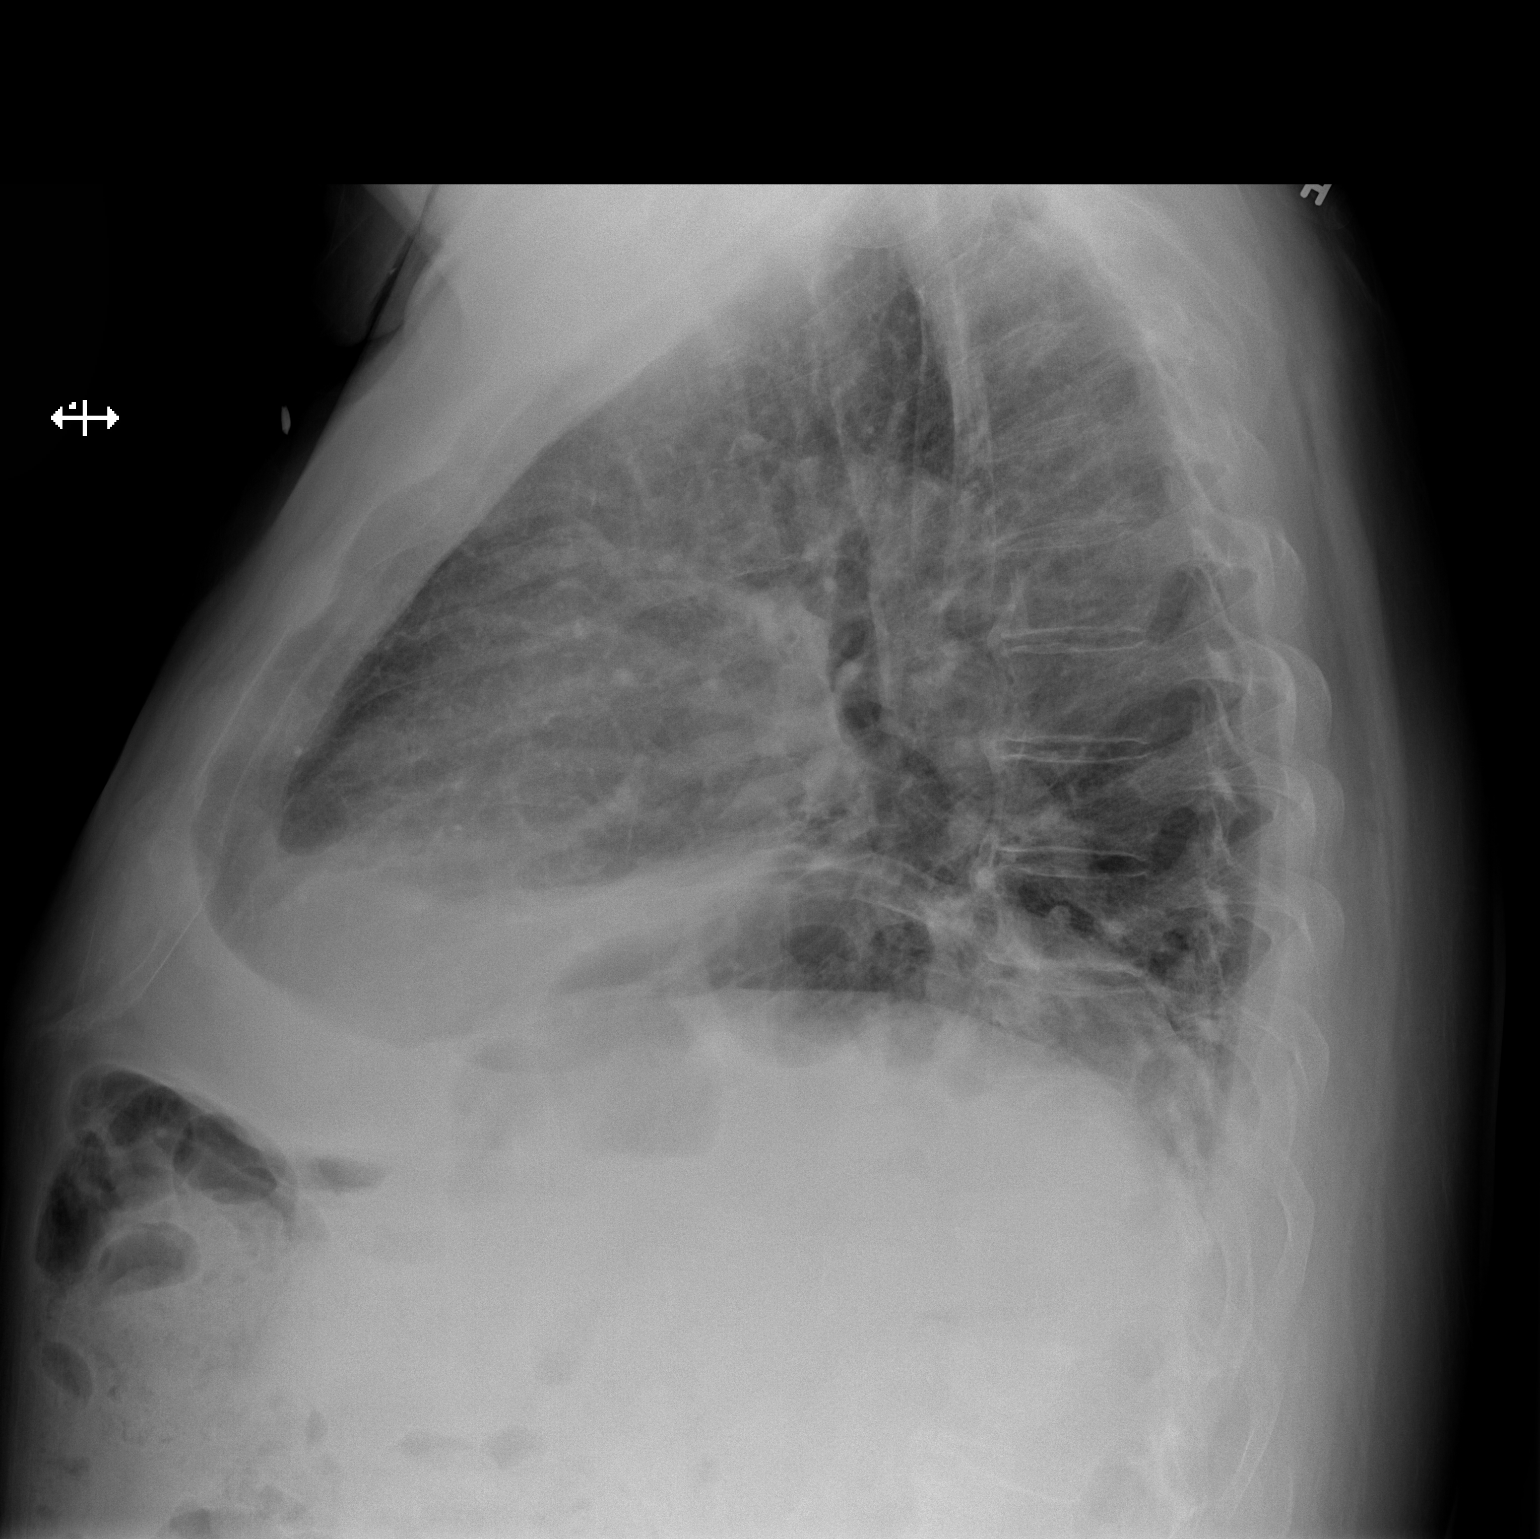

[2 of 2 positions shown; findings below may reference images not displayed]

FINDINGS: Mediastinum and hilar structures normal. Heart size normal. Left
base atelectasis/scarring again noted. Stable elevation left
hemidiaphragm. No pneumothorax. Old right rib fractures again noted.
IMPRESSION: 1. Left base atelectasis/scarring again noted. Stable elevation left
hemidiaphragm.

2.  No acute cardiopulmonary disease otherwise noted.

## 2019-07-17 IMAGING — DX PORTABLE CHEST - 1 VIEW
1 series · 1 of 1 positions shown · non-contrast
Comparison: 11/11/2018

CLINICAL DATA: S/p TAVR using bioprosthetic.

EXAM:
PORTABLE CHEST 1 VIEW

[chest ap]
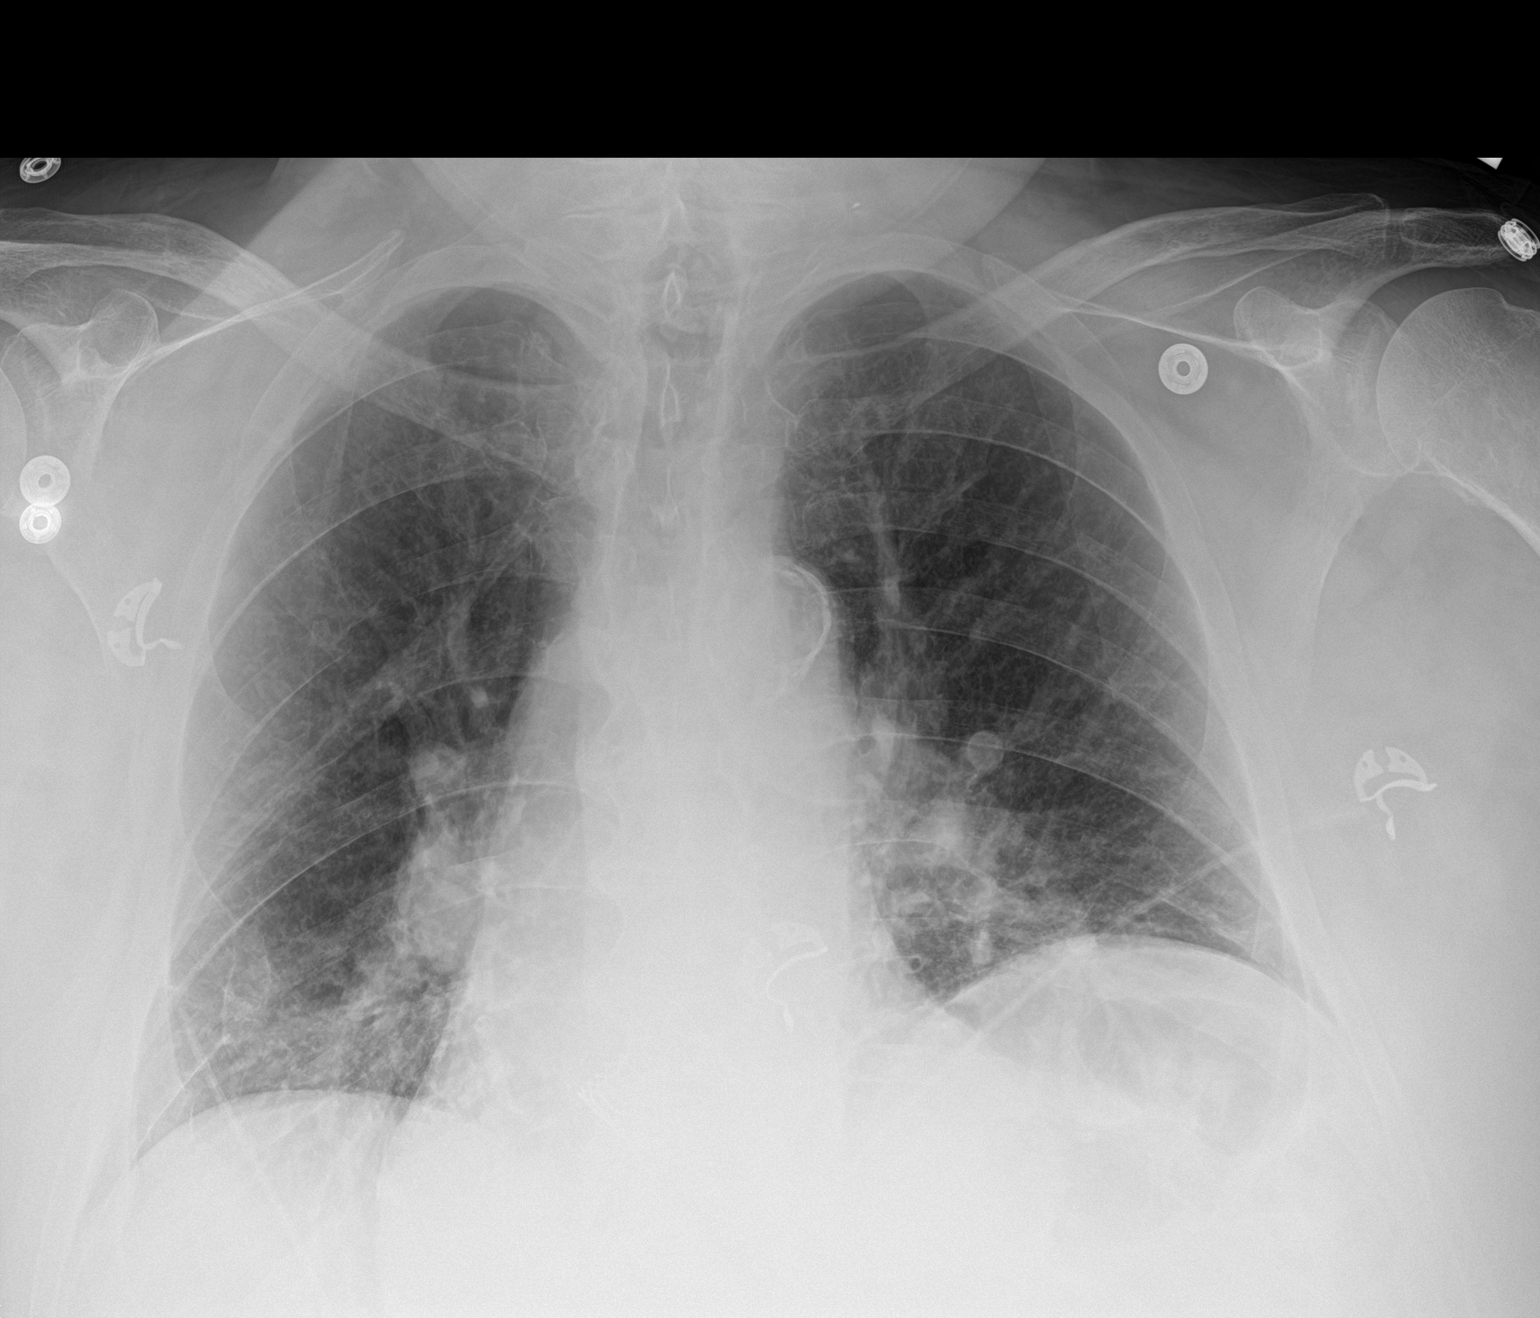

[1 of 1 positions shown; findings below may reference images not displayed]

FINDINGS: Stable cardiomegaly with aortic atherosclerosis. Evidence of recent
TAVR. Bibasilar atelectasis with chronic elevation of the left
hemidiaphragm. Prominent central pulmonary vasculature as before.
IMPRESSION: Bibasilar atelectasis. Stable cardiomegaly with aortic
atherosclerosis.

## 2019-10-19 ENCOUNTER — Telehealth: Payer: Self-pay | Admitting: *Deleted

## 2019-10-19 NOTE — Telephone Encounter (Signed)
   [facility] Pre-operative Risk Assessment    Request for surgical clearance:  1. What type of surgery is being performed? CATARACT EXTRACTION BY PE,IOL-LEFT w/GONIOTOMY   2. When is this surgery scheduled? 10/30/19   3. What type of clearance is required (medical clearance vs. Pharmacy clearance to hold med vs. Both)? MEDICAL  4. Are there any medications that need to be held prior to surgery and how long? NO NEED TO HOLD ANY BLOOD THINNERS PER DR. Harrell Gave SHAH   5. Practice name and name of physician performing surgery?  [facility]; DR. Tama High   6. What is your office phone number [phone]    7.   What is your office fax number [phone]  8.   Anesthesia type (None, local, MAC, general) ? IV SEDATION   Julaine Hua 10/19/2019, 4:06 PM  _________________________________________________________________   (provider comments below)

## 2019-10-19 NOTE — Telephone Encounter (Signed)
   Primary Cardiologist: Ida Rogue, MD  Chart reviewed as part of pre-operative protocol coverage. Cataract extractions are recognized in guidelines as low risk surgeries that do not typically require specific preoperative testing or holding of blood thinner therapy. Therefore, given past medical history and time since last visit, based on ACC/AHA guidelines, Kenneth Larsen would be at acceptable risk for the planned procedure without further cardiovascular testing.   I will route this recommendation to the requesting party via Epic fax function and remove from pre-op pool.  Please call with questions.  [place], [place] 10/19/2019, 4:20 PM

## 2019-11-10 ENCOUNTER — Telehealth: Payer: Self-pay | Admitting: Physician Assistant

## 2019-11-10 NOTE — Telephone Encounter (Signed)
I spoke to the patient who would like his wife to accompany him to his appointment with [facility] on 3/24, although he admits to no impairments.  I explained our CoVid protocol.  He was understanding.    I told him to have her come along and we could put her on a speaker phone during the visit and if Kenneth Larsen would like the wife to accompany, we could arrange.

## 2019-11-10 NOTE — Telephone Encounter (Signed)
Patient wants his wife to come with him to his appt.  He states his wife always comes with him, that two sets of ears are better than one.

## 2019-11-14 NOTE — Progress Notes (Signed)
[facility]                                       Cardiology Office Note    Date:  11/16/2019   ID:  Kenneth Larsen, Kenneth Larsen Jan 03, 1946, MRN [id number]  PCP:  [facility] [place]  Cardiologist: Ida Rogue, MD / Dr. Angelena Form &Dr. Cyndia Bent (TAVR)  CC: 1 year s/p TAVR  History of Present Illness:  Kenneth Larsen is a 74 y.o. 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  He was evaluated by the multidisciplinary valve team and underwent successful TAVR withEdwards Sapien 3 THV (size 53mm)using a transfemoral approach.Post op echo showed EF 50-55%, normally functioning TAVR with mean gradient of 11 mm Hg and no PVL. He was discharged on POD1 with aspirin and plavix. Of note, pre TAVR 10/13/18 showed showed mild nonobstructive coronary disease. 1 month echo was pushed out to August given the covid 19 pandemic. This showed EF 55% with normally functioning TAVR with a mean gradient of 15 mm Hg.   Today he presents to clinic for follow up. Here with wife. Just has cataract surgery. No CP or SOB. No LE edema, orthopnea or PND. No dizziness or syncope. No blood in stool or urine. No palpitations. He stays busy working in the yard, farm and garden.   Past Medical History:  Diagnosis Date  . Arthritis   . Cancer ([place])    2010-melanoma  . Chronic diastolic (congestive) heart failure ([place])   . COPD (chronic obstructive pulmonary disease) ([place])   . Hypertension   . Hypothyroidism   . Severe aortic stenosis     Past Surgical History:  Procedure Laterality Date  . CARDIAC CATHETERIZATION    . RIGHT/LEFT HEART CATH AND CORONARY ANGIOGRAPHY N/A 10/13/2018   Procedure: RIGHT/LEFT HEART CATH AND CORONARY ANGIOGRAPHY;  Surgeon: Nelva Bush, MD;  Location: [facility];  Service: Cardiovascular;   Laterality: N/A;  . TEE WITHOUT CARDIOVERSION N/A 11/15/2018   Procedure: TRANSESOPHAGEAL ECHOCARDIOGRAM (TEE);  Surgeon: Burnell Blanks, MD;  Location: [facility];  Service: Open Heart Surgery;  Laterality: N/A;  . TRANSCATHETER AORTIC VALVE REPLACEMENT, TRANSFEMORAL  11/15/2018  . TRANSCATHETER AORTIC VALVE REPLACEMENT, TRANSFEMORAL N/A 11/15/2018   Procedure: TRANSCATHETER AORTIC VALVE REPLACEMENT, TRANSFEMORAL;  Surgeon: Burnell Blanks, MD;  Location: [facility];  Service: Open Heart Surgery;  Laterality: N/A;  . VASECTOMY      Current Medications: Outpatient Medications Prior to Visit  Medication Sig Dispense Refill  . aspirin EC 81 MG EC tablet Take 1 tablet (81 mg total) by mouth daily. 30 tablet 0  . Cholecalciferol (VITAMIN D-1000 MAX ST) 25 MCG (1000 UT) tablet Take 1,000 Units by mouth daily.     . furosemide (LASIX) 40 MG tablet Take 1 tablet (40 mg total) by mouth daily. 30 tablet 11  . levothyroxine (SYNTHROID, LEVOTHROID) 50 MCG tablet Take 50 mcg by mouth daily before breakfast.     . vitamin B-12 (CYANOCOBALAMIN) 1000 MCG tablet Take 1,000 mcg by mouth daily.    Marland Kitchen albuterol (PROVENTIL HFA;VENTOLIN HFA) 108 (90 Base) MCG/ACT inhaler Inhale 1-2 puffs into the lungs every 6 (six) hours as needed for wheezing  or shortness of breath.    Marland Kitchen atorvastatin (LIPITOR) 20 MG tablet Take 1 tablet (20 mg total) by mouth daily at 6 PM. (Patient not taking: Reported on 11/15/2019) 30 tablet 1  . clopidogrel (PLAVIX) 75 MG tablet Take 1 tablet (75 mg total) by mouth daily with breakfast. (Patient not taking: Reported on 11/15/2019) 90 tablet 3   No facility-administered medications prior to visit.     Allergies:   Penicillins   Social History   Socioeconomic History  . Marital status: Married    Spouse name: Not on file  . Number of children: 3  . Years of education: Not on file  . Highest education level: Not on file  Occupational History  .  Occupation: Retired-Textiles  Tobacco Use  . Smoking status: Former Smoker    Packs/day: 1.00    Years: 25.00    Pack years: 25.00    Types: Cigarettes  . Smokeless tobacco: Never Used  Substance and Sexual Activity  . Alcohol use: Never  . Drug use: Never  . Sexual activity: Not on file  Other Topics Concern  . Not on file  Social History Narrative  . Not on file   Social Determinants of Health   Financial Resource Strain:   . Difficulty of Paying Living Expenses:   Food Insecurity:   . Worried About Charity fundraiser in the Last Year:   . Arboriculturist in the Last Year:   Transportation Needs:   . Film/video editor (Medical):   Marland Kitchen Lack of Transportation (Non-Medical):   Physical Activity:   . Days of Exercise per Week:   . Minutes of Exercise per Session:   Stress:   . Feeling of Stress :   Social Connections:   . Frequency of Communication with Friends and Family:   . Frequency of Social Gatherings with Friends and Family:   . Attends Religious Services:   . Active Member of Clubs or Organizations:   . Attends Archivist Meetings:   Marland Kitchen Marital Status:      Family History:  The patient's family history includes Cancer in his father.     ROS:   Please see the history of present illness.    ROS All other systems reviewed and are negative.   PHYSICAL EXAM:   VS:  BP 138/78   Pulse 70   Ht 6\' 1"  (1.854 m)   Wt 231 lb 1.9 oz (104.8 kg)   SpO2 95%   BMI 30.49 kg/m    GEN: Well nourished, well developed, in no acute distress, overweight HEENT: normal Neck: no JVD or masses Cardiac: RRR; no murmurs, rubs, or gallops,no edema  Respiratory:  clear to auscultation bilaterally, normal work of breathing GI: soft, nontender, nondistended, + BS MS: no deformity or atrophy Skin: warm and dry, no rash Neuro:  Alert and Oriented x 3, Strength and sensation are intact Psych: euthymic mood, full affect   Wt Readings from Last 3 Encounters:  11/15/19  231 lb 1.9 oz (104.8 kg)  12/16/18 230 lb (104.3 kg)  11/22/18 228 lb (103.4 kg)      Studies/Labs Reviewed:   EKG:  EKG is ordered today.  The ekg ordered today demonstrates NSR HR 64 bpm  Recent Labs: No results found for requested labs within last 8760 hours.   Lipid Panel    Component Value Date/Time   CHOL 117 10/14/2018 0627   TRIG 29 10/14/2018 0627   HDL 46 10/14/2018  0627   CHOLHDL 2.5 10/14/2018 0627   VLDL 6 10/14/2018 0627   LDLCALC 65 10/14/2018 0627    Additional studies/ records that were reviewed today include:  TAVR OPERATIVE NOTE   Date of Procedure:                11/15/2018  Preoperative Diagnosis:      Severe Aortic Stenosis   Postoperative Diagnosis:    Same   Procedure:        Transcatheter Aortic Valve Replacement - Percutaneous Left Transfemoral Approach             Edwards Sapien 3 THV (size 29 mm, model # 9600TFX, serial # [id number])              Co-Surgeons:                        Gaye Pollack, MD and Lauree Chandler, MD   Anesthesiologist:                  Wilfrid Lund, MD  Echocardiographer:              Bertrum Sol, MD  Pre-operative Echo Findings: ? Severe aortic stenosis ? Normal left ventricular systolic function  Post-operative Echo Findings: ? No paravalvular leak ? Normal left ventricular systolic function  _________________  Echo 11/16/18 IMPRESSIONS  1. The left ventricle has low normal systolic function, with an ejection  fraction of 50-55%. The cavity size was normal. Left ventricular diastolic  function could not be evaluated.  2. The right ventricle has normal systolic function. The cavity was  normal. There is no increase in right ventricular wall thickness.  3. A 62mm Edwards Sapien bioprosthetic aortic valve (TAVR) valve is  present in the aortic position. Procedure Date: 11/15/2018 Normal aortic  valve prosthesis. Echo findings shows no evidence of rocking or dehiscence  of the aortic  prosthesis.  4. Aortic valve prosthesis not well visualized in short axis. Suboptimal  image quality, no definite prosthetic or periprosthetic regurgitation.  Mean systolic prosthetic gradient 11 mmHg.   _________________  Echo 04/04/19 IMPRESSIONS  1. The left ventricle has normal systolic function, with an ejection  fraction of 55-60%. Left ventricular diastolic Doppler parameters are  consistent with impaired relaxation.  2. Left atrial size was mildly dilated.  3. A Edwards Sapien bioprosthetic aortic valve (TAVR) valve is present in  the aortic position. Procedure Date: 11/15/18.  4. The aortic valve was not well visualized.  5. Thoracic aorta was not well-visualized.  6. Mean aortic valve gradient is 15 mmHg (slightly increased from 11 mmHg  on last study in 10/2018).  7. The interatrial septum was not well visualized.  Aortic Valve: The aortic valve was not well visualized Aortic valve  regurgitation was not visualized by color flow Doppler. A Edwards Sapien  bioprosthetic, stented aortic valve (TAVR) valve is present in the aortic  position. Procedure Date: 11/15/18. Mean  aortic valve gradient is 15 mmHg (slightly increased from 11 mmHg on last  study in 10/2018).   _________________  Echo 11/15/19  IMPRESSIONS  1. Left ventricular ejection fraction, by estimation, is 55%. The left ventricle has normal function. The left ventricle demonstrates regional wall motion abnormalities. Basal inferior and basal inferolateral hypokinesis. There is mild left ventricular  hypertrophy. Left ventricular diastolic parameters are consistent with Grade II diastolic dysfunction (pseudonormalization).  2. Right ventricular systolic function is normal. The right ventricular size is normal. Tricuspid  regurgitation signal is inadequate for assessing PA pressure.  3. Left atrial size was mildly dilated.  4. Right atrial size was mildly dilated.  5. Bioprosthetic aortic valve s/p TAVR (29 mm  Edwards Sapien). Mean gradient 13 mmHg with AVA 1.3 cm^2. No aortic insufficiency noted.  6. Aortic dilatation noted. There is mild dilatation of the aortic root measuring 39 mm.  7. The mitral valve is normal in structure. No evidence of mitral valve regurgitation. No evidence of mitral stenosis.  8. Technically difficult study  _________________  CT 11/15/19 IMPRESSION: 1. No worrisome pulmonary nodules.  No acute findings. 2. Aortic atherosclerosis (ICD10-I70.0). Coronary artery calcification. 3.  Emphysema (ICD10-J43.9).   ASSESSMENT & PLAN:    Severe AS s/p TAVR: echo today shows EF 55%, normally functioning TAVR with a mean gradient of 12 mm Hg and no PVL. Continue on lifelong aspirin 81 mg daily. He understands the need for ongoing SBE prophylaxis.   HTN:Bp borderline today. No changes made.   COPD:stable  Pulmonary nodule: pre TAVR CT 10/27/18 showed a 5 mm subpleural nodule in the lateral segment of the right middle lobe, nonspecific, but statistically likely benign. No follow-up needed if patient is low-risk. Non-contrast chest CT can be considered in 12 months if patient is high-risk. 1 year CT assessment completed today and showed no worrisome pulmonary nodules or acute findings.  Medication Adjustments/Labs and Tests Ordered: Current medicines are reviewed at length with the patient today.  Concerns regarding medicines are outlined above.  Medication changes, Labs and Tests ordered today are listed in the Patient Instructions below. Patient Instructions  Medication Instructions:  Your provider recommends that you continue on your current medications as directed. Please refer to the Current Medication list given to you today.   *If you need a refill on your cardiac medications before your next appointment, please call your pharmacy*  Follow-Up: At [facility], you and your health needs are our priority.  As part of our continuing mission to provide you with  exceptional heart care, we have created designated Provider Care Teams.  These Care Teams include your primary Cardiologist (physician) and Advanced Practice Providers (APPs -  Physician Assistants and Nurse Practitioners) who all work together to provide you with the care you need, when you need it. Your next appointment:   12 month(s) The format for your next appointment:   In Person Provider:   You may see Ida Rogue, MD or one of the following Advanced Practice Providers on your designated Care Team:    Bernerd Pho, PA-C   Ermalinda Barrios, PA-C       Signed, Angelena Form, [place]  11/16/2019 10:00 AM    [address], [place], [place]  [address] Phone: [phone]; Fax: [phone]

## 2019-11-15 ENCOUNTER — Encounter (INDEPENDENT_AMBULATORY_CARE_PROVIDER_SITE_OTHER): Payer: Self-pay

## 2019-11-15 ENCOUNTER — Ambulatory Visit (INDEPENDENT_AMBULATORY_CARE_PROVIDER_SITE_OTHER): Payer: Medicare Other | Admitting: Physician Assistant

## 2019-11-15 ENCOUNTER — Other Ambulatory Visit: Payer: Self-pay

## 2019-11-15 ENCOUNTER — Encounter: Payer: Self-pay | Admitting: Physician Assistant

## 2019-11-15 ENCOUNTER — Ambulatory Visit (INDEPENDENT_AMBULATORY_CARE_PROVIDER_SITE_OTHER)
Admission: RE | Admit: 2019-11-15 | Discharge: 2019-11-15 | Disposition: A | Discharge status: Home / Self Care | Payer: Medicare Other | Source: Ambulatory Visit | Attending: Physician Assistant | Admitting: Physician Assistant

## 2019-11-15 ENCOUNTER — Ambulatory Visit (HOSPITAL_COMMUNITY): Payer: Medicare Other | Attending: Cardiology

## 2019-11-15 VITALS — BP 138/78 | HR 70 | Ht 73.0 in | Wt 231.1 lb

## 2019-11-15 DIAGNOSIS — R918 Other nonspecific abnormal finding of lung field: Secondary | ICD-10-CM

## 2019-11-15 DIAGNOSIS — I10 Essential (primary) hypertension: Secondary | ICD-10-CM | POA: Diagnosis not present

## 2019-11-15 DIAGNOSIS — Z952 Presence of prosthetic heart valve: Secondary | ICD-10-CM | POA: Insufficient documentation

## 2019-11-15 DIAGNOSIS — I350 Nonrheumatic aortic (valve) stenosis: Secondary | ICD-10-CM | POA: Diagnosis not present

## 2019-11-15 DIAGNOSIS — J449 Chronic obstructive pulmonary disease, unspecified: Secondary | ICD-10-CM | POA: Diagnosis not present

## 2019-11-15 DIAGNOSIS — R911 Solitary pulmonary nodule: Secondary | ICD-10-CM | POA: Diagnosis not present

## 2019-11-15 NOTE — Patient Instructions (Signed)
Medication Instructions:  Your provider recommends that you continue on your current medications as directed. Please refer to the Current Medication list given to you today.   *If you need a refill on your cardiac medications before your next appointment, please call your pharmacy*  Follow-Up: At [facility], you and your health needs are our priority.  As part of our continuing mission to provide you with exceptional heart care, we have created designated Provider Care Teams.  These Care Teams include your primary Cardiologist (physician) and Advanced Practice Providers (APPs -  Physician Assistants and Nurse Practitioners) who all work together to provide you with the care you need, when you need it. Your next appointment:   12 month(s) The format for your next appointment:   In Person Provider:   You may see Ida Rogue, MD or one of the following Advanced Practice Providers on your designated Care Team:    [place], PA-C   Ermalinda Barrios, [place]

## 2020-12-10 NOTE — Progress Notes (Signed)
Cardiology Office Note  Date:  12/11/2020   ID:  Micai, Apolinar 02-10-1946, MRN [id number]  PCP:  [facility]   Chief Complaint  Patient presents with  . 12 month follow up     "doing well."  Medications reviewed by the patient verbally.     HPI:  MARIA GALLICCHIO is a 75 y.o. male with a history of  HTN,  COPD with a long history of tobacco abuse (now quit),  chronic diastolic CHF  severe AS,  s/p TAVR (11/15/18)  who presents to clinic for aortic valve disease, s/p TAVR  Reports doing relatively well, no new complaints Active , mows, cuts wood, Cleaning up 37 acres, left by his late mother Lots of work to clean out her old equipment and outdoor buildings  Mild chronic lower extremity edema, no change No regular exercise program  No near syncope syncope, no chest pain concerning for angina Tolerating Lasix 40 daily  EKG personally reviewed by myself on todays visit Shows normal sinus rhythm rate 60 bpm nonspecific ST abnormality, PAC  Other past medical history reviewed successful TAVR withEdwards Sapien 3 THV (size 9mm)using a transfemoral approach.  Post op echo showed EF 50-55%, normally functioning TAVR with mean gradient of 11 mm Hg and no PVL.   He was discharged on POD1 with aspirin and plavix.   pre TAVR 10/13/18 showed showed mild nonobstructive coronary disease.    showed EF 55% with normally functioning TAVR with a mean gradient of 15 mm Hg.   PMH:   has a past medical history of Arthritis, Cancer ([place]), Chronic diastolic (congestive) heart failure ([employer]), COPD (chronic obstructive pulmonary disease) ([place]), Hypertension, Hypothyroidism, and Severe aortic stenosis.  PSH:    Past Surgical History:  Procedure Laterality Date  . CARDIAC CATHETERIZATION    . RIGHT/LEFT HEART CATH AND CORONARY ANGIOGRAPHY N/A 10/13/2018   Procedure: RIGHT/LEFT HEART CATH AND CORONARY ANGIOGRAPHY;  Surgeon: Nelva Bush, MD;  Location: [facility];  Service: Cardiovascular;  Laterality: N/A;  . TEE WITHOUT CARDIOVERSION N/A 11/15/2018   Procedure: TRANSESOPHAGEAL ECHOCARDIOGRAM (TEE);  Surgeon: Burnell Blanks, MD;  Location: [facility];  Service: Open Heart Surgery;  Laterality: N/A;  . TRANSCATHETER AORTIC VALVE REPLACEMENT, TRANSFEMORAL  11/15/2018  . TRANSCATHETER AORTIC VALVE REPLACEMENT, TRANSFEMORAL N/A 11/15/2018   Procedure: TRANSCATHETER AORTIC VALVE REPLACEMENT, TRANSFEMORAL;  Surgeon: Burnell Blanks, MD;  Location: [facility];  Service: Open Heart Surgery;  Laterality: N/A;  . VASECTOMY      Current Outpatient Medications  Medication Sig Dispense Refill  . aspirin EC 81 MG EC tablet Take 1 tablet (81 mg total) by mouth daily. 30 tablet 0  . Cholecalciferol (VITAMIN D-1000 MAX ST) 25 MCG (1000 UT) tablet Take 1,000 Units by mouth daily.     . furosemide (LASIX) 40 MG tablet Take 1 tablet (40 mg total) by mouth daily. 30 tablet 11  . levothyroxine (SYNTHROID, LEVOTHROID) 50 MCG tablet Take 50 mcg by mouth daily before breakfast.     . vitamin B-12 (CYANOCOBALAMIN) 1000 MCG tablet Take 1,000 mcg by mouth daily.     No current facility-administered medications for this visit.     Allergies:   Penicillins   Social History:  The patient  reports that he has quit smoking. His smoking use included cigarettes. He has a 25.00 pack-year smoking history. He has never used smokeless tobacco. He reports that he does not drink alcohol and does not  use drugs.   Family History:   family history includes Cancer in his father.    Review of Systems: Review of Systems  Constitutional: Negative.   HENT: Negative.   Respiratory: Negative.   Cardiovascular: Negative.   Gastrointestinal: Negative.   Musculoskeletal: Negative.   Neurological: Negative.   Psychiatric/Behavioral: Negative.   All other systems reviewed and are negative.    PHYSICAL EXAM: VS:  BP (!) 142/62 (BP Location: Left Arm,  Patient Position: Sitting, Cuff Size: Normal)   Pulse 60   Ht 6\' 1"  (1.854 m)   Wt 226 lb 8 oz (102.7 kg)   SpO2 94%   BMI 29.88 kg/m  , BMI Body mass index is 29.88 kg/m. GEN: Well nourished, well developed, in no acute distress HEENT: normal Neck: no JVD, carotid bruits, or masses Cardiac: RRR; no murmurs, rubs, or gallops,no edema  Respiratory:  clear to auscultation bilaterally, normal work of breathing GI: soft, nontender, nondistended, + BS MS: no deformity or atrophy Skin: warm and dry, no rash Neuro:  Strength and sensation are intact Psych: euthymic mood, full affect   Recent Labs: No results found for requested labs within last 8760 hours.    Lipid Panel Lab Results  Component Value Date   CHOL 117 10/14/2018   HDL 46 10/14/2018   LDLCALC 65 10/14/2018   TRIG 29 10/14/2018      Wt Readings from Last 3 Encounters:  12/11/20 226 lb 8 oz (102.7 kg)  11/15/19 231 lb 1.9 oz (104.8 kg)  12/16/18 230 lb (104.3 kg)       ASSESSMENT AND PLAN:  Problem List Items Addressed This Visit      Cardiology Problems   Severe aortic stenosis - Primary   Relevant Orders   EKG 12-Lead   ECHOCARDIOGRAM COMPLETE    Other Visit Diagnoses    History of transcatheter aortic valve replacement (TAVR)       Relevant Orders   EKG 12-Lead   ECHOCARDIOGRAM COMPLETE   S/P AVR (aortic valve replacement)       Relevant Orders   EKG 12-Lead   ECHOCARDIOGRAM COMPLETE     Aortic valve stenosis/s/p TAVR No clinical change, Will continue Lasix 40 daily We have requested lab work from primary care for our records No labs in the system for the past 2 years Discussed changes to watch for including worsening lower extremity edema, abdominal swelling, shortness of breath, PND orthopnea -Order for echocardiogram placed  COPD Reports breathing stable Non-smoker    Total encounter time more than 25 minutes  Greater than 50% was spent in counseling and coordination of care with  the patient    Signed, Esmond Plants, M.D., Ph.D. [place], [place]

## 2020-12-11 ENCOUNTER — Other Ambulatory Visit: Payer: Self-pay

## 2020-12-11 ENCOUNTER — Telehealth: Payer: Self-pay

## 2020-12-11 ENCOUNTER — Ambulatory Visit (INDEPENDENT_AMBULATORY_CARE_PROVIDER_SITE_OTHER): Payer: Medicare Other | Admitting: Cardiovascular Disease

## 2020-12-11 ENCOUNTER — Encounter: Payer: Self-pay | Admitting: Cardiovascular Disease

## 2020-12-11 VITALS — BP 142/62 | HR 60 | Ht 73.0 in | Wt 226.5 lb

## 2020-12-11 DIAGNOSIS — Z952 Presence of prosthetic heart valve: Secondary | ICD-10-CM | POA: Diagnosis not present

## 2020-12-11 DIAGNOSIS — I350 Nonrheumatic aortic (valve) stenosis: Secondary | ICD-10-CM

## 2020-12-11 NOTE — Telephone Encounter (Signed)
Fax sent to [facility] requesting labs for Dr. Rockey Situ to review and be uploaded to pt's chart

## 2020-12-11 NOTE — Patient Instructions (Addendum)
Medication Instructions:  No changes  If you need a refill on your cardiac medications before your next appointment, please call your pharmacy.    Lab work: No new labs needed We will get labs from [facility]   Testing/Procedures: Echo for AVR/TAVR valve (schedule for 6 months out)  Your physician has requested that you have an echocardiogram. Echocardiography is a painless test that uses sound waves to create images of your heart. It provides your doctor with information about the size and shape of your heart and how well your heart's chambers and valves are working. This procedure takes approximately one hour. There are no restrictions for this procedure.  There is a possibility that an IV may need to be started during your test to inject an image enhancing agent. This is done to obtain more optimal pictures of your heart. Therefore we ask that you do at least drink some water prior to coming in to hydrate your veins.    Follow-Up:  . You will need a follow up appointment in 12 months  . Providers on your designated Care Team:   . Murray Hodgkins, NP . Christell Faith, PA-C . Marrianne Mood, PA-C   COVID-19 Vaccine Information can be found at: [email or web address] For questions related to vaccine distribution or appointments, please email [email or web address] or call [phone].

## 2021-06-12 ENCOUNTER — Other Ambulatory Visit: Payer: Self-pay

## 2021-06-12 ENCOUNTER — Ambulatory Visit (INDEPENDENT_AMBULATORY_CARE_PROVIDER_SITE_OTHER): Payer: Medicare Other

## 2021-06-12 DIAGNOSIS — Z952 Presence of prosthetic heart valve: Secondary | ICD-10-CM

## 2021-06-12 DIAGNOSIS — I350 Nonrheumatic aortic (valve) stenosis: Secondary | ICD-10-CM | POA: Diagnosis not present

## 2021-06-12 LAB — ECHOCARDIOGRAM COMPLETE
AR max vel: 2.88 cm2
AV Area VTI: 2.72 cm2
AV Area mean vel: 2.65 cm2
AV Mean grad: 7.5 mmHg
AV Peak grad: 18.1 mmHg
Ao pk vel: 2.13 m/s
Area-P 1/2: 3.42 cm2
Calc EF: 56.1 %
Single Plane A2C EF: 59.4 %
Single Plane A4C EF: 55.6 %

## 2021-06-23 ENCOUNTER — Telehealth: Payer: Self-pay

## 2021-06-23 NOTE — Telephone Encounter (Signed)
Able to reach pt regarding his recent ECHO, Dr. Rockey Situ had a chance to review his results and advised   "Echocardiogram  Normal LV function  Aortic valve functioning well, stable pressure gradient  No other significant valvular heart disease "  Kenneth Larsen very thankful for the phone call of his results, all questions and concerns were address with nothing further at this time. Will see at next schedule f/u appt.

## 2021-12-29 NOTE — Progress Notes (Signed)
Cardiology Office Note ? ?Date:  12/30/2021  ? ?ID:  Kenneth Larsen, DOB 08-20-1946, MRN [id number] ? ?PCP:  [facility], [facility]  ? ?Chief Complaint  ?Patient presents with  ? 12 month follow up   ?  "Doing well." Medications reviewed by the patient verbally.   ? ? ?HPI:  ?Kenneth Larsen is a 76 y.o. 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 ?Last seen by myself in clinic April 2022 ? ?Echo 10/22 reviewed on today's visit ?Normal LV function ?Aortic valve functioning well, stable pressure gradient ?No other significant valvular heart disease ? ?Weight trending down, does not eat as much ?Active in garden ? ?Home blood pressure: 916 systolic ?Elevated in office 384 systolic ? ?Mild SOB, stable ?Off oxygen at night ?Reports oxygen provided after he had his TAVR several years ago, has not had any follow-up with pulmonary ?He stopped this on his own as he did not think he needed it ?He does report prior smoking history, reports sleeping fine ? ?On prior visits was active, cleaning up [address] acres, left by his late mother ?Tolerating Lasix 40 daily, labs requested from primary care ? ?EKG personally reviewed by myself on todays visit ?Shows normal sinus rhythm rate 70 bpm nonspecific ST abnormality ? ?Other past medical history reviewed ?successful TAVR with Edwards Sapien 3 THV (size 52m) using a transfemoral approach.  ? ?Post op echo showed EF 50-55%, normally functioning TAVR with mean gradient of 11 mm Hg and no PVL.  ? ?He was discharged on POD1 with aspirin and plavix.  ? ? pre TAVR 10/13/18 showed showed mild nonobstructive coronary disease.  ? ? showed EF 55% with normally functioning TAVR with a mean gradient of 15 mm Hg.  ? ?PMH:   has a past medical history of Arthritis, Cancer ([facility], Chronic diastolic (congestive) heart failure ([place], COPD  (chronic obstructive pulmonary disease) ([facility], Hypertension, Hypothyroidism, and Severe aortic stenosis. ? ?PSH:    ?Past Surgical History:  ?Procedure Laterality Date  ? CARDIAC CATHETERIZATION    ? RIGHT/LEFT HEART CATH AND CORONARY ANGIOGRAPHY N/A 10/13/2018  ? Procedure: RIGHT/LEFT HEART CATH AND CORONARY ANGIOGRAPHY;  Surgeon: ENelva Bush MD;  [facility];  Service: Cardiovascular;  Laterality: N/A;  ? TEE WITHOUT CARDIOVERSION N/A 11/15/2018  ? Procedure: TRANSESOPHAGEAL ECHOCARDIOGRAM (TEE);  Surgeon: MBurnell Blanks MD;  [facility];  Service: Open Heart Surgery;  Laterality: N/A;  ? TRANSCATHETER AORTIC VALVE REPLACEMENT, TRANSFEMORAL  11/15/2018  ? TRANSCATHETER AORTIC VALVE REPLACEMENT, TRANSFEMORAL N/A 11/15/2018  ? Procedure: TRANSCATHETER AORTIC VALVE REPLACEMENT, TRANSFEMORAL;  Surgeon: MBurnell Blanks MD;  [facility];  Service: Open Heart Surgery;  Laterality: N/A;  ? VASECTOMY    ? ? ?Current Outpatient Medications  ?Medication Sig Dispense Refill  ? aspirin EC 81 MG EC tablet Take 1 tablet (81 mg total) by mouth daily. 30 tablet 0  ? Cholecalciferol (VITAMIN D-1000 MAX ST) 25 MCG (1000 UT) tablet Take 1,000 Units by mouth daily.     ? furosemide (LASIX) 40 MG tablet Take 1 tablet (40 mg total) by mouth daily. 30 tablet 11  ? levothyroxine (SYNTHROID, LEVOTHROID) 50 MCG tablet Take 50 mcg by mouth daily before breakfast.     ? vitamin B-12 (CYANOCOBALAMIN) 1000  MCG tablet Take 1,000 mcg by mouth daily.    ? ?No current facility-administered medications for this visit.  ? ? ?Allergies:   Penicillins  ? ?Social History:  The patient  reports that he has quit smoking. His smoking use included cigarettes. He has a 25.00 pack-year smoking history. He has never used smokeless tobacco. He reports that he does not drink alcohol and does not use drugs.  ? ?Family History:   family history includes Cancer in his father.  ? ? ?Review of  Systems: ?Review of Systems  ?Constitutional: Negative.   ?HENT: Negative.    ?Respiratory: Negative.    ?Cardiovascular: Negative.   ?Gastrointestinal: Negative.   ?Musculoskeletal: Negative.   ?Neurological: Negative.   ?Psychiatric/Behavioral: Negative.    ?All other systems reviewed and are negative. ? ? ?PHYSICAL EXAM: ?VS:  BP (!) 150/82 (BP Location: Left Arm, Patient Position: Sitting, Cuff Size: Normal)   Pulse 70   Ht '6\' 1"'$  (1.854 m)   Wt 218 lb 8 oz (99.1 kg)   BMI 28.83 kg/m?  , BMI Body mass index is 28.83 kg/m?Marland Kitchen ?Constitutional:  oriented to person, place, and time. No distress.  ?HENT:  ?Head: Grossly normal ?Eyes:  no discharge. No scleral icterus.  ?Neck: No JVD, no carotid bruits  ?Cardiovascular: Regular rate and rhythm, no murmurs appreciated ?Pulmonary/Chest: Clear to auscultation bilaterally, no wheezes or rails ?Abdominal: Soft.  no distension.  no tenderness.  ?Musculoskeletal: Normal range of motion ?Neurological:  normal muscle tone. Coordination normal. No atrophy ?Skin: Skin warm and dry ?Psychiatric: normal affect, pleasant ? ?Recent Labs: ?No results found for requested labs within last 8760 hours.  ? ? ?Lipid Panel ?Lab Results  ?Component Value Date  ? CHOL 117 10/14/2018  ? HDL 46 10/14/2018  ? [place] 65 10/14/2018  ? TRIG 29 10/14/2018  ? ? ?Wt Readings from Last 3 Encounters:  ?12/30/21 218 lb 8 oz (99.1 kg)  ?12/11/20 226 lb 8 oz (102.7 kg)  ?11/15/19 231 lb 1.9 oz (104.8 kg)  ?  ? ?ASSESSMENT AND PLAN: ? ?Problem List Items Addressed This Visit   ? ?  ? Cardiology Problems  ? Severe aortic stenosis - Primary  ? Relevant Orders  ? EKG 12-Lead  ? Hypertension  ? Relevant Orders  ? EKG 12-Lead  ? Chronic diastolic heart failure ([place])  ? Relevant Orders  ? EKG 12-Lead  ? ?Other Visit Diagnoses   ? ? History of transcatheter aortic valve replacement (TAVR)      ? Relevant Orders  ? EKG 12-Lead  ? ?  ?Aortic valve stenosis/s/p TAVR ?Stable, echo stable, results reviewed ?Will  continue Lasix 40 daily ?Appears euvolemic, ?Labs requested from PMD ? ?Chronic diastolic CHF ?Remains on Lasix, appears euvolemic ?We have requested BMP from primary care ? ?COPD ?Reports breathing stable ?Non-smoker ?Wants to come off oxygen at night, recommend he may want to have a evaluation by pulmonary to determine if this is needed ?Ideally may benefit from a nocturnal oximetry study ? ? ? Total encounter time more than 30 minutes ? Greater than 50% was spent in counseling and coordination of care with the patient ? ? ? ?Signed, ?Esmond Plants, M.D., Ph.D. ?[facility] [place], [place] ?[phone] ?

## 2021-12-30 ENCOUNTER — Ambulatory Visit (INDEPENDENT_AMBULATORY_CARE_PROVIDER_SITE_OTHER): Payer: Medicare Other | Admitting: Cardiovascular Disease

## 2021-12-30 ENCOUNTER — Encounter: Payer: Self-pay | Admitting: Cardiovascular Disease

## 2021-12-30 VITALS — BP 150/82 | HR 70 | Ht 73.0 in | Wt 218.5 lb

## 2021-12-30 DIAGNOSIS — I10 Essential (primary) hypertension: Secondary | ICD-10-CM

## 2021-12-30 DIAGNOSIS — Z952 Presence of prosthetic heart valve: Secondary | ICD-10-CM

## 2021-12-30 DIAGNOSIS — I5032 Chronic diastolic (congestive) heart failure: Secondary | ICD-10-CM

## 2021-12-30 DIAGNOSIS — I350 Nonrheumatic aortic (valve) stenosis: Secondary | ICD-10-CM

## 2021-12-30 NOTE — Patient Instructions (Addendum)
Monitor blood pressure, call if elevated ? ?Medication Instructions:  ?No changes ? ?If you need a refill on your cardiac medications before your next appointment, please call your pharmacy.  ? ?Lab work: ?No new labs needed ? ?Testing/Procedures: ?No new testing needed ? ?Follow-Up: ?At [facility], you and your health needs are our priority.  As part of our continuing mission to provide you with exceptional heart care, we have created designated Provider Care Teams.  These Care Teams include your primary Cardiologist (physician) and Advanced Practice Providers (APPs -  Physician Assistants and Nurse Practitioners) who all work together to provide you with the care you need, when you need it. ? ?You will need a follow up appointment in 12 months ? ?Providers on your designated Care Team:   ?Murray Hodgkins, NP ?Christell Faith, PA-C ?Cadence Kathlen Mody, PA-C ? ?COVID-19 Vaccine Information can be found at: [email or web address].uk For questions related to vaccine distribution or appointments, please email [email or web address] or call [phone].  ? ?

## 2022-01-05 ENCOUNTER — Encounter: Payer: Self-pay | Admitting: Cardiovascular Disease

## 2022-05-06 ENCOUNTER — Emergency Department: Payer: Medicare Other

## 2022-05-06 ENCOUNTER — Other Ambulatory Visit: Payer: Self-pay

## 2022-05-06 ENCOUNTER — Inpatient Hospital Stay
Admission: EM | Admit: 2022-05-06 | Discharge: 2022-05-12 | DRG: 291 | Disposition: A | Discharge status: Home Health | Payer: Medicare Other | Attending: Internal Medicine | Admitting: Internal Medicine

## 2022-05-06 DIAGNOSIS — Z79890 Hormone replacement therapy (postmenopausal): Secondary | ICD-10-CM

## 2022-05-06 DIAGNOSIS — R7989 Other specified abnormal findings of blood chemistry: Secondary | ICD-10-CM

## 2022-05-06 DIAGNOSIS — J449 Chronic obstructive pulmonary disease, unspecified: Secondary | ICD-10-CM

## 2022-05-06 DIAGNOSIS — E039 Hypothyroidism, unspecified: Secondary | ICD-10-CM | POA: Diagnosis present

## 2022-05-06 DIAGNOSIS — E8881 Metabolic syndrome: Secondary | ICD-10-CM | POA: Diagnosis present

## 2022-05-06 DIAGNOSIS — Z66 Do not resuscitate: Secondary | ICD-10-CM | POA: Diagnosis present

## 2022-05-06 DIAGNOSIS — I10 Essential (primary) hypertension: Secondary | ICD-10-CM | POA: Diagnosis present

## 2022-05-06 DIAGNOSIS — J9601 Acute respiratory failure with hypoxia: Secondary | ICD-10-CM

## 2022-05-06 DIAGNOSIS — Z7982 Long term (current) use of aspirin: Secondary | ICD-10-CM

## 2022-05-06 DIAGNOSIS — K5900 Constipation, unspecified: Secondary | ICD-10-CM | POA: Diagnosis not present

## 2022-05-06 DIAGNOSIS — M1990 Unspecified osteoarthritis, unspecified site: Secondary | ICD-10-CM | POA: Diagnosis present

## 2022-05-06 DIAGNOSIS — R0600 Dyspnea, unspecified: Secondary | ICD-10-CM

## 2022-05-06 DIAGNOSIS — G9341 Metabolic encephalopathy: Secondary | ICD-10-CM | POA: Diagnosis present

## 2022-05-06 DIAGNOSIS — I2510 Atherosclerotic heart disease of native coronary artery without angina pectoris: Secondary | ICD-10-CM | POA: Diagnosis present

## 2022-05-06 DIAGNOSIS — E874 Mixed disorder of acid-base balance: Secondary | ICD-10-CM | POA: Diagnosis present

## 2022-05-06 DIAGNOSIS — I493 Ventricular premature depolarization: Secondary | ICD-10-CM | POA: Diagnosis present

## 2022-05-06 DIAGNOSIS — E669 Obesity, unspecified: Secondary | ICD-10-CM | POA: Diagnosis present

## 2022-05-06 DIAGNOSIS — R0902 Hypoxemia: Principal | ICD-10-CM

## 2022-05-06 DIAGNOSIS — R0602 Shortness of breath: Secondary | ICD-10-CM | POA: Diagnosis not present

## 2022-05-06 DIAGNOSIS — Z91148 Patient's other noncompliance with medication regimen for other reason: Secondary | ICD-10-CM

## 2022-05-06 DIAGNOSIS — J441 Chronic obstructive pulmonary disease with (acute) exacerbation: Secondary | ICD-10-CM

## 2022-05-06 DIAGNOSIS — Z20822 Contact with and (suspected) exposure to covid-19: Secondary | ICD-10-CM | POA: Diagnosis present

## 2022-05-06 DIAGNOSIS — I5033 Acute on chronic diastolic (congestive) heart failure: Secondary | ICD-10-CM | POA: Diagnosis present

## 2022-05-06 DIAGNOSIS — Z79899 Other long term (current) drug therapy: Secondary | ICD-10-CM

## 2022-05-06 DIAGNOSIS — J9622 Acute and chronic respiratory failure with hypercapnia: Secondary | ICD-10-CM | POA: Diagnosis not present

## 2022-05-06 DIAGNOSIS — J9602 Acute respiratory failure with hypercapnia: Secondary | ICD-10-CM | POA: Diagnosis not present

## 2022-05-06 DIAGNOSIS — J9621 Acute and chronic respiratory failure with hypoxia: Secondary | ICD-10-CM | POA: Diagnosis present

## 2022-05-06 DIAGNOSIS — I110 Hypertensive heart disease with heart failure: Principal | ICD-10-CM | POA: Diagnosis present

## 2022-05-06 DIAGNOSIS — Z952 Presence of prosthetic heart valve: Secondary | ICD-10-CM | POA: Insufficient documentation

## 2022-05-06 DIAGNOSIS — I5032 Chronic diastolic (congestive) heart failure: Secondary | ICD-10-CM | POA: Diagnosis present

## 2022-05-06 DIAGNOSIS — Z87891 Personal history of nicotine dependence: Secondary | ICD-10-CM

## 2022-05-06 DIAGNOSIS — E66811 Obesity, class 1: Secondary | ICD-10-CM

## 2022-05-06 DIAGNOSIS — I248 Other forms of acute ischemic heart disease: Secondary | ICD-10-CM | POA: Diagnosis present

## 2022-05-06 DIAGNOSIS — E871 Hypo-osmolality and hyponatremia: Secondary | ICD-10-CM

## 2022-05-06 DIAGNOSIS — Z809 Family history of malignant neoplasm, unspecified: Secondary | ICD-10-CM

## 2022-05-06 DIAGNOSIS — R778 Other specified abnormalities of plasma proteins: Secondary | ICD-10-CM | POA: Insufficient documentation

## 2022-05-06 DIAGNOSIS — Z880 Allergy status to penicillin: Secondary | ICD-10-CM

## 2022-05-06 DIAGNOSIS — E873 Alkalosis: Secondary | ICD-10-CM | POA: Diagnosis not present

## 2022-05-06 DIAGNOSIS — Z6828 Body mass index (BMI) 28.0-28.9, adult: Secondary | ICD-10-CM

## 2022-05-06 DIAGNOSIS — Z85820 Personal history of malignant melanoma of skin: Secondary | ICD-10-CM

## 2022-05-06 HISTORY — DX: Other specified abnormal findings of blood chemistry: R79.89

## 2022-05-06 HISTORY — DX: Hypo-osmolality and hyponatremia: E87.1

## 2022-05-06 LAB — COMPREHENSIVE METABOLIC PANEL
ALT: 10 U/L (ref 0–44)
AST: 15 U/L (ref 15–41)
Albumin: 3.6 g/dL (ref 3.5–5.0)
Alkaline Phosphatase: 59 U/L (ref 38–126)
Anion gap: 7 (ref 5–15)
BUN: 12 mg/dL (ref 8–23)
CO2: 32 mmol/L (ref 22–32)
Calcium: 8.4 mg/dL — ABNORMAL LOW (ref 8.9–10.3)
Chloride: 90 mmol/L — ABNORMAL LOW (ref 98–111)
Creatinine, Ser: 0.8 mg/dL (ref 0.61–1.24)
GFR, Estimated: >60 mL/min (ref >60)
Glucose, Bld: 131 mg/dL — ABNORMAL HIGH (ref 70–99)
Potassium: 3.8 mmol/L (ref 3.5–5.1)
Sodium: 129 mmol/L — ABNORMAL LOW (ref 135–145)
Total Bilirubin: 1.2 mg/dL (ref 0.3–1.2)
Total Protein: 6.4 g/dL — ABNORMAL LOW (ref 6.5–8.1)

## 2022-05-06 LAB — CBC WITH DIFFERENTIAL/PLATELET
Abs Immature Granulocytes: 0.01 10*3/uL (ref 0.00–0.07)
Basophils Absolute: 0 10*3/uL (ref 0.0–0.1)
Basophils Relative: 1 %
Eosinophils Absolute: 0.1 10*3/uL (ref 0.0–0.5)
Eosinophils Relative: 1 %
HCT: 47.2 % (ref 39.0–52.0)
Hemoglobin: 15.2 g/dL (ref 13.0–17.0)
Immature Granulocytes: 0 %
Lymphocytes Relative: 8 %
Lymphs Abs: 0.6 10*3/uL — ABNORMAL LOW (ref 0.7–4.0)
MCH: 30.2 pg (ref 26.0–34.0)
MCHC: 32.2 g/dL (ref 30.0–36.0)
MCV: 93.8 fL (ref 80.0–100.0)
Monocytes Absolute: 0.6 10*3/uL (ref 0.1–1.0)
Monocytes Relative: 8 %
Neutro Abs: 5.8 10*3/uL (ref 1.7–7.7)
Neutrophils Relative %: 82 %
Platelets: 258 10*3/uL (ref 150–400)
RBC: 5.03 MIL/uL (ref 4.22–5.81)
RDW: 13 % (ref 11.5–15.5)
WBC: 7.1 10*3/uL (ref 4.0–10.5)
nRBC: 0 % (ref 0.0–0.2)

## 2022-05-06 LAB — URINALYSIS, ROUTINE W REFLEX MICROSCOPIC
Bilirubin Urine: NEGATIVE
Glucose, UA: NEGATIVE mg/dL
Hgb urine dipstick: NEGATIVE
Ketones, ur: NEGATIVE mg/dL
Leukocytes,Ua: NEGATIVE
Nitrite: NEGATIVE
Protein, ur: NEGATIVE mg/dL
Specific Gravity, Urine: 1.012 (ref 1.005–1.030)
pH: 6 (ref 5.0–8.0)

## 2022-05-06 LAB — CBC
HCT: 44.7 % (ref 39.0–52.0)
Hemoglobin: 14.3 g/dL (ref 13.0–17.0)
MCH: 30.2 pg (ref 26.0–34.0)
MCHC: 32 g/dL (ref 30.0–36.0)
MCV: 94.5 fL (ref 80.0–100.0)
Platelets: 236 10*3/uL (ref 150–400)
RBC: 4.73 MIL/uL (ref 4.22–5.81)
RDW: 12.9 % (ref 11.5–15.5)
WBC: 6.6 10*3/uL (ref 4.0–10.5)
nRBC: 0 % (ref 0.0–0.2)

## 2022-05-06 LAB — TROPONIN I (HIGH SENSITIVITY)
Troponin I (High Sensitivity): 31 ng/L — ABNORMAL HIGH (ref <18)
Troponin I (High Sensitivity): 30 ng/L — ABNORMAL HIGH (ref <18)

## 2022-05-06 LAB — PROTIME-INR
INR: 1.1 (ref 0.8–1.2)
Prothrombin Time: 13.6 seconds (ref 11.4–15.2)

## 2022-05-06 LAB — BLOOD GAS, VENOUS
Acid-Base Excess: 9.7 mmol/L — ABNORMAL HIGH (ref 0.0–2.0)
Bicarbonate: 38.1 mmol/L — ABNORMAL HIGH (ref 20.0–28.0)
O2 Saturation: 96.2 %
Patient temperature: 37
pCO2, Ven: 69 mmHg — ABNORMAL HIGH (ref 44–60)
pH, Ven: 7.35 (ref 7.25–7.43)
pO2, Ven: 78 mmHg — ABNORMAL HIGH (ref 32–45)

## 2022-05-06 LAB — TSH: TSH: 2.137 u[IU]/mL (ref 0.350–4.500)

## 2022-05-06 LAB — LACTIC ACID, PLASMA
Lactic Acid, Venous: 1 mmol/L (ref 0.5–1.9)
Lactic Acid, Venous: 0.6 mmol/L (ref 0.5–1.9)

## 2022-05-06 LAB — RESP PANEL BY RT-PCR (FLU A&B, COVID) ARPGX2
Influenza A by PCR: NEGATIVE
Influenza B by PCR: NEGATIVE
SARS Coronavirus 2 by RT PCR: NEGATIVE

## 2022-05-06 LAB — CREATININE, SERUM
Creatinine, Ser: 0.72 mg/dL (ref 0.61–1.24)
GFR, Estimated: >60 mL/min (ref >60)

## 2022-05-06 LAB — BRAIN NATRIURETIC PEPTIDE: B Natriuretic Peptide: 255.5 pg/mL — ABNORMAL HIGH (ref 0.0–100.0)

## 2022-05-06 MED ORDER — ACETAMINOPHEN 325 MG PO TABS: 650.0000 mg | ORAL_TABLET | Freq: Four times a day (QID) | ORAL | Status: DC | PRN

## 2022-05-06 MED ORDER — FUROSEMIDE 10 MG/ML IJ SOLN
40.0000 mg | Freq: Two times a day (BID) | INTRAMUSCULAR | Status: DC
  Administered 2022-05-06 – 2022-05-09 (×6): 40 mg via INTRAVENOUS
  Filled 2022-05-06 (×6): qty 4

## 2022-05-06 MED ORDER — ENOXAPARIN SODIUM 40 MG/0.4ML IJ SOSY
40.0000 mg | PREFILLED_SYRINGE | INTRAMUSCULAR | Status: DC
  Administered 2022-05-06 – 2022-05-11 (×6): 40 mg via SUBCUTANEOUS
  Filled 2022-05-06 (×6): qty 0.4

## 2022-05-06 MED ORDER — FUROSEMIDE 10 MG/ML IJ SOLN
40.0000 mg | Freq: Once | INTRAMUSCULAR | Status: AC
  Administered 2022-05-06: 40 mg via INTRAVENOUS
  Filled 2022-05-06: qty 4

## 2022-05-06 MED ORDER — ACETAMINOPHEN 650 MG RE SUPP: 650.0000 mg | Freq: Four times a day (QID) | RECTAL | Status: DC | PRN

## 2022-05-06 MED ORDER — ASPIRIN 81 MG PO TBEC
81.0000 mg | DELAYED_RELEASE_TABLET | Freq: Every day | ORAL | Status: DC
  Administered 2022-05-06 – 2022-05-12 (×7): 81 mg via ORAL
  Filled 2022-05-06 (×7): qty 1

## 2022-05-06 MED ORDER — IOHEXOL 350 MG/ML SOLN
75.0000 mL | Freq: Once | INTRAVENOUS | Status: AC | PRN
  Administered 2022-05-06: 75 mL via INTRAVENOUS

## 2022-05-06 MED ORDER — IPRATROPIUM-ALBUTEROL 0.5-2.5 (3) MG/3ML IN SOLN
3.0000 mL | RESPIRATORY_TRACT | Status: AC
  Administered 2022-05-06: 3 mL via RESPIRATORY_TRACT
  Filled 2022-05-06: qty 3

## 2022-05-06 MED ORDER — LEVOTHYROXINE SODIUM 50 MCG PO TABS
50.0000 ug | ORAL_TABLET | Freq: Every day | ORAL | Status: DC
  Administered 2022-05-07 – 2022-05-12 (×6): 50 ug via ORAL
  Filled 2022-05-06 (×6): qty 1

## 2022-05-06 MED ORDER — ONDANSETRON HCL 4 MG/2ML IJ SOLN: 4.0000 mg | Freq: Four times a day (QID) | INTRAMUSCULAR | Status: DC | PRN

## 2022-05-06 MED ORDER — IPRATROPIUM-ALBUTEROL 0.5-2.5 (3) MG/3ML IN SOLN: 3.0000 mL | RESPIRATORY_TRACT | Status: DC | PRN

## 2022-05-06 MED ORDER — ONDANSETRON HCL 4 MG PO TABS: 4.0000 mg | ORAL_TABLET | Freq: Four times a day (QID) | ORAL | Status: DC | PRN

## 2022-05-06 NOTE — Assessment & Plan Note (Signed)
Continue levothyroxine 

## 2022-05-06 NOTE — H&P (Signed)
History and Physical    Patient: Kenneth Larsen FXT:[id number] DOB: October 31, 1945 DOA: 05/06/2022 DOS: the patient was seen and examined on 05/06/2022 PCP: [facility], [facility]  Patient coming from: Home  Chief Complaint:  Chief Complaint  Patient presents with   Shortness of Breath    HPI: [name] is a 76 y.o. male with medical history significant for HTN, , hypothyroidism, ex-smoker, COPD previously on nighttime oxygen, ex-smoker, diastolic CHF, EF 29% 04/2425, severe AAS s/p TAVR 10/2018 who presents to the ED with a 3-day history of shortness of breath and weakness associated with mild lower extremity edema.  Denies cough, fever or chills or chest pain.  Patient was last seen by his cardiologist in May 2023 at which time he was stable. ED course and data review: Afebrile, pulse 86-1 07, respirations up to 40 with O2 sat [place] at 85% on arrival requiring up to 5 L..  BP 164/85 on arrival.  Labs significant for troponin of 31 and BNP 255.  VBG with normal pH and PCO2 69.  CBC unremarkable, CMP significant for hyponatremia of 129.  UA unremarkable, COVID and flu negative EKG personally viewed and interpreted showing NSR at 93 with few PVCs and no acute ST-T wave changes. CTA chest negative for PE and with no acute cardiopulmonary findings Patient treated with IV Lasix and given a DuoNeb treatment with symptomatic improvement and was able to wean down to 3 L at the time of admission.  Hospitalist consulted for admission    Review of Systems: As mentioned in the history of present illness. All other systems reviewed and are negative.  Past Medical History:  Diagnosis Date   Arthritis    Cancer ([place])    2010-melanoma   Chronic diastolic (congestive) heart failure (HCC)    COPD (chronic obstructive pulmonary disease) ([place])    Hypertension    Hypothyroidism    Severe aortic stenosis    Past Surgical History:  Procedure Laterality Date   CARDIAC CATHETERIZATION      RIGHT/LEFT HEART CATH AND CORONARY ANGIOGRAPHY N/A 10/13/2018   Procedure: RIGHT/LEFT HEART CATH AND CORONARY ANGIOGRAPHY;  Surgeon: Nelva Bush, MD;  Location: [place] [facility];  Service: Cardiovascular;  Laterality: N/A;   TEE WITHOUT CARDIOVERSION N/A 11/15/2018   Procedure: TRANSESOPHAGEAL ECHOCARDIOGRAM (TEE);  Surgeon: Burnell Blanks, MD;  Location: [place] [facility];  Service: Open Heart Surgery;  Laterality: N/A;   TRANSCATHETER AORTIC VALVE REPLACEMENT, TRANSFEMORAL  11/15/2018   TRANSCATHETER AORTIC VALVE REPLACEMENT, TRANSFEMORAL N/A 11/15/2018   Procedure: TRANSCATHETER AORTIC VALVE REPLACEMENT, TRANSFEMORAL;  Surgeon: Burnell Blanks, MD;  Location: [place] [facility];  Service: Open Heart Surgery;  Laterality: N/A;   VASECTOMY     Social History:  reports that he has quit smoking. His smoking use included cigarettes. He has a 25.00 pack-year smoking history. He has never used smokeless tobacco. He reports that he does not drink alcohol and does not use drugs.  Allergies  Allergen Reactions   Penicillins Hives    Did it involve swelling of the face/tongue/throat, SOB, or low BP? No Did it involve sudden or severe rash/hives, skin peeling, or any reaction on the inside of your mouth or nose? No Did you need to seek medical attention at a hospital or doctor's office? No When did it last happen?      childhood allergy If all above answers are "NO", may proceed with cephalosporin use.     Family History  Problem Relation  Age of Onset   Cancer Father     Prior to Admission medications   Medication Sig Start Date End Date Taking? Authorizing Provider  aspirin EC 81 MG EC tablet Take 1 tablet (81 mg total) by mouth daily. 10/15/18   [name], MD  Cholecalciferol (VITAMIN D-1000 MAX ST) 25 MCG (1000 UT) tablet Take 1,000 Units by mouth daily.     [provider]  furosemide (LASIX) 40 MG tablet Take 1 tablet (40 mg total) by mouth daily. 10/14/18  12/30/21  [name], MD  levothyroxine (SYNTHROID, LEVOTHROID) 50 MCG tablet Take 50 mcg by mouth daily before breakfast.     [provider]  vitamin B-12 (CYANOCOBALAMIN) 1000 MCG tablet Take 1,000 mcg by mouth daily.    [provider]    Physical Exam: Vitals:   05/06/22 1730 05/06/22 1900 05/06/22 1930 05/06/22 2000  BP: (!) 172/80 (!) 148/77 (!) 162/80 (!) 160/74  Pulse: 86 (!) 107 87 82  Resp: (!) 26 19 (!) 30 (!) 28  Temp:      SpO2: 96% 95% 93% 94%  Weight:      Height:       Physical Exam Vitals and nursing note reviewed.  Constitutional:      General: He is not in acute distress.    Comments: Conversational dyspnea  HENT:     Head: Normocephalic and atraumatic.  Cardiovascular:     Rate and Rhythm: Regular rhythm. Tachycardia present.     Heart sounds: Normal heart sounds.  Pulmonary:     Effort: Tachypnea present.     Breath sounds: Normal breath sounds.  Abdominal:     Palpations: Abdomen is soft.     Tenderness: There is no abdominal tenderness.  Neurological:     Mental Status: Mental status is at baseline.     Labs on Admission: I have personally reviewed following labs and imaging studies  CBC: Recent Labs  Lab 05/06/22 1532  WBC 7.1  NEUTROABS 5.8  HGB 15.2  HCT 47.2  MCV 93.8  PLT 423   Basic Metabolic Panel: Recent Labs  Lab 05/06/22 1532  NA 129*  K 3.8  CL 90*  CO2 32  GLUCOSE 131*  BUN 12  CREATININE 0.80  CALCIUM 8.4*   GFR: Estimated Creatinine Clearance: 96.1 mL/min (by C-G formula based on SCr of 0.8 mg/dL). Liver Function Tests: Recent Labs  Lab 05/06/22 1532  AST 15  ALT 10  ALKPHOS 59  BILITOT 1.2  PROT 6.4*  ALBUMIN 3.6   No results for input(s): "LIPASE", "AMYLASE" in the last 168 hours. No results for input(s): "AMMONIA" in the last 168 hours. Coagulation Profile: Recent Labs  Lab 05/06/22 1532  INR 1.1   Cardiac Enzymes: No results for input(s): "CKTOTAL", "CKMB", "CKMBINDEX",  "TROPONINI" in the last 168 hours. BNP (last 3 results) No results for input(s): "PROBNP" in the last 8760 hours. HbA1C: No results for input(s): "HGBA1C" in the last 72 hours. CBG: No results for input(s): "GLUCAP" in the last 168 hours. Lipid Profile: No results for input(s): "CHOL", "HDL", "LDLCALC", "TRIG", "CHOLHDL", "LDLDIRECT" in the last 72 hours. Thyroid Function Tests: No results for input(s): "TSH", "T4TOTAL", "FREET4", "T3FREE", "THYROIDAB" in the last 72 hours. Anemia Panel: No results for input(s): "VITAMINB12", "FOLATE", "FERRITIN", "TIBC", "IRON", "RETICCTPCT" in the last 72 hours. Urine analysis:    Component Value Date/Time   COLORURINE YELLOW (A) 05/06/2022 1746   APPEARANCEUR CLEAR (A) 05/06/2022 1746   LABSPEC 1.012 05/06/2022  [place] 6.0 05/06/2022 1746   GLUCOSEU NEGATIVE 05/06/2022 1746   HGBUR NEGATIVE 05/06/2022 1746   BILIRUBINUR NEGATIVE 05/06/2022 1746   KETONESUR NEGATIVE 05/06/2022 1746   PROTEINUR NEGATIVE 05/06/2022 1746   NITRITE NEGATIVE 05/06/2022 1746   LEUKOCYTESUR NEGATIVE 05/06/2022 1746    Radiological Exams on Admission: CT Angio Chest PE W/Cm &/Or Wo Cm  Result Date: 05/06/2022 CLINICAL DATA:  Shortness of breath, weakness. Pulmonary embolism (PE) suspected, high prob EXAM: CT ANGIOGRAPHY CHEST WITH CONTRAST TECHNIQUE: Multidetector CT imaging of the chest was performed using the standard protocol during bolus administration of intravenous contrast. Multiplanar CT image reconstructions and MIPs were obtained to evaluate the vascular anatomy. RADIATION DOSE REDUCTION: This exam was performed according to the departmental dose-optimization program which includes automated exposure control, adjustment of the mA and/or kV according to patient size and/or use of iterative reconstruction technique. CONTRAST:  32m OMNIPAQUE IOHEXOL 350 MG/ML SOLN COMPARISON:  11/15/2019 FINDINGS: Cardiovascular: No filling defects in the pulmonary arteries  to suggest pulmonary emboli. Heart is normal size. Aorta is normal caliber. Coronary artery and aortic calcifications. Aortic valve repair. Mediastinum/Nodes: No mediastinal, hilar, or axillary adenopathy. Trachea and esophagus are unremarkable. Thyroid unremarkable. Lungs/Pleura: Mild elevation of the left hemidiaphragm. No confluent airspace opacities or effusions. Upper Abdomen: Imaging into the upper abdomen demonstrates no acute findings. Musculoskeletal: Chest wall soft tissues are unremarkable. No acute bony abnormality. Review of the MIP images confirms the above findings. IMPRESSION: No evidence of pulmonary embolus. No acute cardiopulmonary disease. Coronary artery disease. Aortic Atherosclerosis (ICD10-I70.0). Electronically Signed   By: KRolm BaptiseM.D.   On: 05/06/2022 18:47   DG Chest 2 View  Result Date: 05/06/2022 CLINICAL DATA:  Suspected sepsis EXAM: CHEST - 2 VIEW COMPARISON:  CT chest 11/15/2019 FINDINGS: Postprocedural changes from prior aortic valve replacement. No pleural effusion. No pneumothorax. Asymmetric elevation of the left hemidiaphragm, unchanged compared to prior exam. No focal airspace opacity. Unchanged cardiac and mediastinal contours. There is evidence of pulmonary venous congestion without evidence of overt pulmonary edema. Gas distended stomach. No acute osseous abnormality. IMPRESSION: No active cardiopulmonary disease. Electronically Signed   By: HMarin RobertsM.D.   On: 05/06/2022 16:16     Data Reviewed: Relevant notes from primary care and specialist visits, past discharge summaries as available in EHR, including Care Everywhere. Prior diagnostic testing as pertinent to current admission diagnoses Updated medications and problem lists for reconciliation ED course, including vitals, labs, imaging, treatment and response to treatment Triage notes, nursing and pharmacy notes and ED provider's notes Notable results as noted in HPI   Assessment and Plan: *  Acute respiratory failure with hypoxia and hypercapnia ([place] Patient presented with increased work of breathing with O2 sat 85% on room air, initially required up to 6 L now on 3 L saturating in the low 90s Etiology primarily related to CHF and possibly a component of COPD.  CTA chest negative for PE Previous history of nighttime O2 requirement, which patient self discontinued VBG with normal pH but PCO2 of 69 Continue oxygen to keep sats over 90% Home O2 evaluation prior to discharge Treat CHF and COPD as outlined  Acute on chronic diastolic CHF (congestive heart failure) (HCC) BNP elevated to 255.  Troponin 31 suspecting demand ischemia from increased work of breathing Mild lower extremity edema but otherwise appears euvolemic, chest x-ray clear We will continue IV Lasix (patient takes oral Lasix at home) Daily weights with intake and output monitoring Patient not  currently on beta-blockers or ARB/ACE with no contraindications and keeps up with cardiology visits We will consult cardiology for assistance with medication optimization Echo.  Last EF was October 2022 with EF 55%   Hyponatremia Suspect hypervolemic from CHF Continue with fluid restriction, diuretic therapy for CHF and monitor for improvement  COPD with mild acute exacerbation ([place]) Treated with DuoNebs in the ED, not wheezing at the time of admission We will continue DuoNebs every 6/as needed Patient is an ex-smoker   Severe aortic stenosis S/P TAVR (transcatheter aortic valve replacement) No acute issues suspected but follow-up valve function on echocardiogram  Hypertension On furosemide only  Hypothyroidism Continue levothyroxine        DVT prophylaxis: Lovenox  Consults: cardiology [facility]  Advance Care Planning:   Code Status: Prior   Family Communication: none  Disposition Plan: Back to previous home environment  Severity of Illness: The appropriate patient status for this patient is OBSERVATION.  Observation status is judged to be reasonable and necessary in order to provide the required intensity of service to ensure the patient's safety. The patient's presenting symptoms, physical exam findings, and initial radiographic and laboratory data in the context of their medical condition is felt to place them at decreased risk for further clinical deterioration. Furthermore, it is anticipated that the patient will be medically stable for discharge from the hospital within 2 midnights of admission.   Author: Athena Masse, MD 05/06/2022 8:11 PM  For on call review [email or web address].

## 2022-05-06 NOTE — Assessment & Plan Note (Deleted)
BNP elevated to 255.  Troponin 31 suspecting demand ischemia from increased work of breathing Mild lower extremity edema but otherwise appears euvolemic, chest x-ray clear We will continue IV Lasix (patient takes oral Lasix at home) Daily weights with intake and output monitoring Patient not currently on beta-blockers or ARB/ACE with no contraindications and keeps up with cardiology visits We will consult cardiology for assistance with medication optimization Echo.  Last EF was October 2022 with EF 55%

## 2022-05-06 NOTE — Assessment & Plan Note (Addendum)
Improving gas exchange. Tolerating NIV overnight. Dyspnea improved. Treated with bronchodilators, systemic corticosteroid, add inhaled steroids.  Continue PT, OT and pulmonary hygiene with flutter valve and incentive spirometer.  Follow up with Pulmonology.

## 2022-05-06 NOTE — Assessment & Plan Note (Addendum)
Continue diuresis with furosemide, add SGLT 2 inh Continue with losartan and metoprolol.

## 2022-05-06 NOTE — ED Triage Notes (Signed)
First Nurse Note:  Pt via [facility] from [facility]. Pt was sent over for hypoxia and pitting edema. States that O2 was 75% on RA, KC placed pt on 2L [place] and O2 increased to 93%. Pt does have a hx of CHF. Pt is A&Ox4 and NAD

## 2022-05-06 NOTE — ED Provider Notes (Signed)
[facility] Provider Note    Event Date/Time   First MD Initiated Contact with Patient 05/06/22 1612     (approximate)   History   Shortness of Breath   HPI  Kenneth Larsen is a 76 y.o. male   Past medical history of COPD, diastolic heart failure who presents with shortness of breath for the last several days.  EMS found him to be hypoxic to 85% on room air and placed on a nasal cannula oxygen with improvement.  Patient denies fever, cough, chest pain, or recent sick contacts.  He states that he has had a several pound weight gain, but denies increase in swelling anywhere in his body that he notices.   History was obtained via the patient and review of external medical notes.      Physical Exam   Triage Vital Signs: ED Triage Vitals  Enc Vitals Group     BP 05/06/22 1527 (!) 164/85     Pulse Rate 05/06/22 1527 92     Resp 05/06/22 1527 (!) 22     Temp 05/06/22 1527 98.6 F (37 C)     Temp src --      SpO2 05/06/22 1527 (!) 85 %     Weight 05/06/22 1530 220 lb (99.8 kg)     Height 05/06/22 1530 '6\' 1"'$  (1.854 m)     Head Circumference --      Peak Flow --      Pain Score 05/06/22 1530 0     Pain Loc --      Pain Edu? --      Excl. in [place]? --     Most recent vital signs: Vitals:   05/06/22 1930 05/06/22 2000  BP: (!) 162/80 (!) 160/74  Pulse: 87 82  Resp: (!) 30 (!) 28  Temp:    SpO2: 93% 94%    General: Awake, no distress.  CV:  Good peripheral perfusion.  Very mild edema to bilateral lower extremities. Resp:  Normal effort.  No wheezing or focalities, no rales. Abd:  No distention.    ED Results / Procedures / Treatments   Labs (all labs ordered are listed, but only abnormal results are displayed) Labs Reviewed  COMPREHENSIVE METABOLIC PANEL - Abnormal; Notable for the following components:      Result Value   Sodium 129 (*)    Chloride 90 (*)    Glucose, Bld 131 (*)    Calcium 8.4 (*)    Total Protein 6.4 (*)    All  other components within normal limits  CBC WITH DIFFERENTIAL/PLATELET - Abnormal; Notable for the following components:   Lymphs Abs 0.6 (*)    All other components within normal limits  URINALYSIS, ROUTINE W REFLEX MICROSCOPIC - Abnormal; Notable for the following components:   Color, Urine YELLOW (*)    APPearance CLEAR (*)    All other components within normal limits  BRAIN NATRIURETIC PEPTIDE - Abnormal; Notable for the following components:   B Natriuretic Peptide 255.5 (*)    All other components within normal limits  BLOOD GAS, VENOUS - Abnormal; Notable for the following components:   pCO2, Ven 69 (*)    pO2, Ven 78 (*)    Bicarbonate 38.1 (*)    Acid-Base Excess 9.7 (*)    All other components within normal limits  TROPONIN I (HIGH SENSITIVITY) - Abnormal; Notable for the following components:   Troponin I (High Sensitivity) 31 (*)    All  other components within normal limits  RESP PANEL BY RT-PCR (FLU A&B, COVID) ARPGX2  CULTURE, BLOOD (ROUTINE X 2)  CULTURE, BLOOD (ROUTINE X 2)  LACTIC ACID, PLASMA  LACTIC ACID, PLASMA  PROTIME-INR  TROPONIN I (HIGH SENSITIVITY)     I reviewed labs and they are notable for a CO2 69 with normal pH, indicative of chronic retainer history of COPD.  Initial troponin 31.  EKG  ED ECG REPORT I, Lucillie Garfinkel, the attending physician, personally viewed and interpreted this ECG.   Date: 05/06/2022  EKG Time: 1531  Rate: 93  Rhythm: normal sinus rhythm, frequent PVC's noted  Axis: normal  Intervals:no ischemic changes    RADIOLOGY I independently reviewed and interpreted CXR and see no focality or pneumothorax   PROCEDURES:  Critical Care performed: Yes, see critical care procedure note(s)  .Critical Care  Performed by: Lucillie Garfinkel, MD Authorized by: Lucillie Garfinkel, MD   Critical care provider statement:    Critical care time (minutes):  30   Critical care was necessary to treat or prevent imminent or life-threatening  deterioration of the following conditions:  Respiratory failure   Critical care was time spent personally by me on the following activities:  Development of treatment plan with patient or surrogate, discussions with consultants, ordering and review of radiographic studies, ordering and review of laboratory studies, pulse oximetry, re-evaluation of patient's condition, examination of patient and evaluation of patient's response to treatment    MEDICATIONS ORDERED IN ED: Medications  ipratropium-albuterol (DUONEB) 0.5-2.5 (3) MG/3ML nebulizer solution 3 mL (3 mLs Nebulization Given 05/06/22 1746)  furosemide (LASIX) injection 40 mg (40 mg Intravenous Given 05/06/22 1749)  iohexol (OMNIPAQUE) 350 MG/ML injection 75 mL (75 mLs Intravenous Contrast Given 05/06/22 1817)    Consultants:  I spoke with hospitalist regarding care plan for this patient.   IMPRESSION / MDM / ASSESSMENT AND PLAN / ED COURSE  I reviewed the triage vital signs and the nursing notes.                              Differential diagnosis includes, but is not limited to, PD exacerbation, CHF exacerbation, pulmonary embolism, considered but less likely ACS, valve dysfunction.   The patient is on the cardiac monitor to evaluate for evidence of arrhythmia and/or significant heart rate changes.  MDM: Patient with a very mildly elevated proBNP and no significant findings on chest x-ray to explain his dyspnea.  He was treated for both COPD exacerbation with DuoNebs and diuresis for potential CHF exacerbation though he does not appear very clinically fluid overloaded on my exam, he feels symptomatically better but remains on oxygen.  CTA was negative for PE.  Admit to hospitalist service for dyspnea and hypoxemia requiring new oxygen requirement.   Patient's presentation is most consistent with acute presentation with potential threat to life or bodily function.       FINAL CLINICAL IMPRESSION(S) / ED DIAGNOSES   Final  diagnoses:  Hypoxemia  Dyspnea, unspecified type     Rx / DC Orders   ED Discharge Orders     None        Note:  This document was prepared using Dragon voice recognition software and may include unintentional dictation errors.    Lucillie Garfinkel, MD 05/06/22 2012

## 2022-05-06 NOTE — ED Triage Notes (Signed)
Pt to ED from [facility] for shob and weakness x 3 days and hypoxia. Pt 85% on RA with no exertion. Placed on 3 L [place] with improvement to 91%. Denies sore throat, cough, chest pain. Labored breathing noted.

## 2022-05-06 NOTE — Assessment & Plan Note (Deleted)
Patient presented with increased work of breathing with O2 sat 85% on room air, initially required up to 6 L now on 3 L saturating in the low 90s Etiology primarily related to CHF and possibly a component of COPD.  CTA chest negative for PE Previous history of nighttime O2 requirement, which patient self discontinued VBG with normal pH but PCO2 of 69 Continue oxygen to keep sats over 90% Home O2 evaluation prior to discharge Treat CHF and COPD as outlined

## 2022-05-06 NOTE — Assessment & Plan Note (Addendum)
Resolved with diuresis. Was likely hypervolemic. Repeat BMP at follow up.

## 2022-05-06 NOTE — Assessment & Plan Note (Signed)
Troponin 31 but without chest pain or  ischemic EKG changes Likely demand ischemia from increased work of breathing Continue to trend Continue aspirin Cardiology has been consulted.

## 2022-05-06 NOTE — Assessment & Plan Note (Signed)
No acute issues suspected but follow-up valve function on echocardiogram

## 2022-05-07 ENCOUNTER — Observation Stay (HOSPITAL_COMMUNITY)
Admit: 2022-05-07 | Discharge: 2022-05-07 | Disposition: A | Discharge status: Home / Self Care | Payer: Medicare Other | Attending: Internal Medicine | Admitting: Internal Medicine

## 2022-05-07 DIAGNOSIS — J9622 Acute and chronic respiratory failure with hypercapnia: Secondary | ICD-10-CM | POA: Diagnosis not present

## 2022-05-07 DIAGNOSIS — E871 Hypo-osmolality and hyponatremia: Secondary | ICD-10-CM

## 2022-05-07 DIAGNOSIS — I10 Essential (primary) hypertension: Secondary | ICD-10-CM | POA: Diagnosis not present

## 2022-05-07 DIAGNOSIS — Z85820 Personal history of malignant melanoma of skin: Secondary | ICD-10-CM | POA: Diagnosis not present

## 2022-05-07 DIAGNOSIS — J441 Chronic obstructive pulmonary disease with (acute) exacerbation: Secondary | ICD-10-CM | POA: Diagnosis present

## 2022-05-07 DIAGNOSIS — E874 Mixed disorder of acid-base balance: Secondary | ICD-10-CM | POA: Diagnosis present

## 2022-05-07 DIAGNOSIS — I5033 Acute on chronic diastolic (congestive) heart failure: Secondary | ICD-10-CM | POA: Diagnosis present

## 2022-05-07 DIAGNOSIS — Z7982 Long term (current) use of aspirin: Secondary | ICD-10-CM | POA: Diagnosis not present

## 2022-05-07 DIAGNOSIS — I493 Ventricular premature depolarization: Secondary | ICD-10-CM | POA: Diagnosis present

## 2022-05-07 DIAGNOSIS — I5031 Acute diastolic (congestive) heart failure: Secondary | ICD-10-CM

## 2022-05-07 DIAGNOSIS — J9602 Acute respiratory failure with hypercapnia: Secondary | ICD-10-CM | POA: Diagnosis not present

## 2022-05-07 DIAGNOSIS — E873 Alkalosis: Secondary | ICD-10-CM | POA: Diagnosis not present

## 2022-05-07 DIAGNOSIS — J9601 Acute respiratory failure with hypoxia: Secondary | ICD-10-CM

## 2022-05-07 DIAGNOSIS — M1990 Unspecified osteoarthritis, unspecified site: Secondary | ICD-10-CM | POA: Diagnosis present

## 2022-05-07 DIAGNOSIS — R778 Other specified abnormalities of plasma proteins: Secondary | ICD-10-CM | POA: Diagnosis not present

## 2022-05-07 DIAGNOSIS — I248 Other forms of acute ischemic heart disease: Secondary | ICD-10-CM | POA: Diagnosis present

## 2022-05-07 DIAGNOSIS — Z952 Presence of prosthetic heart valve: Secondary | ICD-10-CM | POA: Diagnosis not present

## 2022-05-07 DIAGNOSIS — J9621 Acute and chronic respiratory failure with hypoxia: Secondary | ICD-10-CM | POA: Diagnosis present

## 2022-05-07 DIAGNOSIS — R0602 Shortness of breath: Secondary | ICD-10-CM | POA: Diagnosis present

## 2022-05-07 DIAGNOSIS — R0902 Hypoxemia: Secondary | ICD-10-CM | POA: Diagnosis not present

## 2022-05-07 DIAGNOSIS — Z79899 Other long term (current) drug therapy: Secondary | ICD-10-CM | POA: Diagnosis not present

## 2022-05-07 DIAGNOSIS — G9341 Metabolic encephalopathy: Secondary | ICD-10-CM | POA: Diagnosis present

## 2022-05-07 DIAGNOSIS — Z809 Family history of malignant neoplasm, unspecified: Secondary | ICD-10-CM | POA: Diagnosis not present

## 2022-05-07 DIAGNOSIS — E039 Hypothyroidism, unspecified: Secondary | ICD-10-CM | POA: Diagnosis present

## 2022-05-07 DIAGNOSIS — Z20822 Contact with and (suspected) exposure to covid-19: Secondary | ICD-10-CM | POA: Diagnosis present

## 2022-05-07 DIAGNOSIS — E669 Obesity, unspecified: Secondary | ICD-10-CM | POA: Diagnosis present

## 2022-05-07 DIAGNOSIS — Z880 Allergy status to penicillin: Secondary | ICD-10-CM | POA: Diagnosis not present

## 2022-05-07 DIAGNOSIS — Z87891 Personal history of nicotine dependence: Secondary | ICD-10-CM | POA: Diagnosis not present

## 2022-05-07 DIAGNOSIS — Z79890 Hormone replacement therapy (postmenopausal): Secondary | ICD-10-CM | POA: Diagnosis not present

## 2022-05-07 DIAGNOSIS — I110 Hypertensive heart disease with heart failure: Secondary | ICD-10-CM | POA: Diagnosis present

## 2022-05-07 DIAGNOSIS — Z66 Do not resuscitate: Secondary | ICD-10-CM | POA: Diagnosis present

## 2022-05-07 HISTORY — DX: Acute respiratory failure with hypoxia: J96.01

## 2022-05-07 LAB — ECHOCARDIOGRAM COMPLETE
AR max vel: 1.34 cm2
AV Area VTI: 1.45 cm2
AV Area mean vel: 1.34 cm2
AV Mean grad: 5.2 mmHg
AV Peak grad: 9.7 mmHg
Ao pk vel: 1.56 m/s
Area-P 1/2: 2.52 cm2
Height: 73 in
S' Lateral: 2.6 cm
Weight: 3396.85 oz

## 2022-05-07 LAB — BASIC METABOLIC PANEL
Anion gap: 8 (ref 5–15)
BUN: 9 mg/dL (ref 8–23)
CO2: 36 mmol/L — ABNORMAL HIGH (ref 22–32)
Calcium: 8.3 mg/dL — ABNORMAL LOW (ref 8.9–10.3)
Chloride: 88 mmol/L — ABNORMAL LOW (ref 98–111)
Creatinine, Ser: 0.78 mg/dL (ref 0.61–1.24)
GFR, Estimated: >60 mL/min (ref >60)
Glucose, Bld: 100 mg/dL — ABNORMAL HIGH (ref 70–99)
Potassium: 3.7 mmol/L (ref 3.5–5.1)
Sodium: 132 mmol/L — ABNORMAL LOW (ref 135–145)

## 2022-05-07 MED ORDER — DOCUSATE SODIUM 100 MG PO CAPS
100.0000 mg | ORAL_CAPSULE | Freq: Two times a day (BID) | ORAL | Status: DC
  Administered 2022-05-07 – 2022-05-11 (×7): 100 mg via ORAL
  Filled 2022-05-07 (×9): qty 1

## 2022-05-07 MED ORDER — METOPROLOL SUCCINATE ER 25 MG PO TB24
25.0000 mg | ORAL_TABLET | Freq: Every day | ORAL | Status: DC
  Administered 2022-05-07 – 2022-05-12 (×6): 25 mg via ORAL
  Filled 2022-05-07 (×6): qty 1

## 2022-05-07 MED ORDER — STERILE WATER FOR INJECTION IJ SOLN
INTRAMUSCULAR | Status: AC
  Administered 2022-05-07: 10 mL
  Filled 2022-05-07: qty 10

## 2022-05-07 MED ORDER — ADULT MULTIVITAMIN W/MINERALS CH
1.0000 | ORAL_TABLET | Freq: Every day | ORAL | Status: DC
  Administered 2022-05-07 – 2022-05-12 (×6): 1 via ORAL
  Filled 2022-05-07 (×6): qty 1

## 2022-05-07 MED ORDER — METHYLPREDNISOLONE SODIUM SUCC 125 MG IJ SOLR
60.0000 mg | Freq: Every day | INTRAMUSCULAR | Status: DC
  Administered 2022-05-07 – 2022-05-08 (×2): 60 mg via INTRAVENOUS
  Filled 2022-05-07 (×2): qty 2

## 2022-05-07 MED ORDER — ENSURE ENLIVE PO LIQD
237.0000 mL | Freq: Two times a day (BID) | ORAL | Status: DC
  Administered 2022-05-07 – 2022-05-12 (×5): 237 mL via ORAL

## 2022-05-07 MED ORDER — LOSARTAN POTASSIUM 50 MG PO TABS
50.0000 mg | ORAL_TABLET | Freq: Every day | ORAL | Status: DC
  Administered 2022-05-07 – 2022-05-12 (×6): 50 mg via ORAL
  Filled 2022-05-07 (×6): qty 1

## 2022-05-07 NOTE — Consult Note (Signed)
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  He was noted to be tachyarrythmic and dyspneic found to have hypoxemia requiring 5L/min [place].  He had blood work done in ER with mild elevation of BNP >250 mild hyponatremia with hypervolemic fluid status, mild hypertension, normal VBG on 5L O2, essentially normal CBC and viral workup was negative for covid, and flu.  He had CT chest which I reviewed with findings of bronchitic changes diffusely and bilaterally to moderate severity with absence of substantial pulmonary edema, pleural effusions, consolidated infiltrate negative for PE, no areas of frank honeycombing or pneumothorax. He was able to speak in full sentences and endorses chest discomfort unlike his normal dyspnea. He denies constitutional symptoms. PCCM consultation for further evaluation and management.    PAST MEDICAL HISTORY   Past Medical History:  Diagnosis Date   Arthritis    Cancer ([place])    2010-melanoma   Chronic diastolic (congestive) heart failure (HCC)    COPD (chronic obstructive pulmonary disease) ([place])    Hypertension    Hypothyroidism    Severe aortic stenosis      SURGICAL HISTORY   Past Surgical History:  Procedure Laterality Date   CARDIAC CATHETERIZATION     RIGHT/LEFT HEART CATH AND CORONARY ANGIOGRAPHY N/A 10/13/2018   Procedure: RIGHT/LEFT HEART CATH AND CORONARY ANGIOGRAPHY;  Surgeon: Nelva Bush, MD;  Location: [facility];  Service:  Cardiovascular;  Laterality: N/A;   TEE WITHOUT CARDIOVERSION N/A 11/15/2018   Procedure: TRANSESOPHAGEAL ECHOCARDIOGRAM (TEE);  Surgeon: Burnell Blanks, MD;  Location: [facility];  Service: Open Heart Surgery;  Laterality: N/A;   TRANSCATHETER AORTIC VALVE REPLACEMENT, TRANSFEMORAL  11/15/2018   TRANSCATHETER AORTIC VALVE REPLACEMENT, TRANSFEMORAL N/A 11/15/2018   Procedure: TRANSCATHETER AORTIC VALVE REPLACEMENT, TRANSFEMORAL;  Surgeon: Burnell Blanks, MD;  Location: [facility];  Service: Open Heart Surgery;  Laterality: N/A;   VASECTOMY       FAMILY HISTORY   Family History  Problem Relation Age of Onset   Cancer Father      SOCIAL HISTORY   Social History   Tobacco Use   Smoking status: Former    Packs/day: 1.00    Years: 25.00    Total pack years: 25.00    Types: Cigarettes   Smokeless tobacco: Never  Vaping Use   Vaping Use: Never used  Substance Use Topics   Alcohol use: Never   Drug use: Never     MEDICATIONS    Home Medication:    Current Medication:  Current Facility-Administered Medications:    acetaminophen (TYLENOL) tablet 650 mg, 650 mg, Oral, Q6H PRN **OR** acetaminophen (TYLENOL) suppository 650 mg, 650 mg, Rectal, Q6H PRN, Athena Masse, MD   aspirin EC tablet 81 mg, 81 mg, Oral, Daily, Judd Gaudier V, MD, 81 mg at 05/07/22 0958   enoxaparin (LOVENOX) injection 40  mg, 40 mg, Subcutaneous, Q24H, Athena Masse, MD, 40 mg at 05/06/22 2056   feeding supplement (ENSURE ENLIVE / ENSURE PLUS) liquid 237 mL, 237 mL, Oral, BID BM, Adhikari, Amrit, MD   furosemide (LASIX) injection 40 mg, 40 mg, Intravenous, Q12H, Athena Masse, MD, 40 mg at 05/07/22 0853   ipratropium-albuterol (DUONEB) 0.5-2.5 (3) MG/3ML nebulizer solution 3 mL, 3 mL, Nebulization, Q4H PRN, Athena Masse, MD   levothyroxine (SYNTHROID) tablet 50 mcg, 50 mcg, Oral, QAC breakfast, Athena Masse, MD, 50 mcg at 05/07/22 0518   losartan (COZAAR) tablet  50 mg, 50 mg, Oral, Daily, Rockey Situ, Kathlene November, MD, 50 mg at 05/07/22 3267   metoprolol succinate (TOPROL-XL) 24 hr tablet 25 mg, 25 mg, Oral, Daily, Rockey Situ, Kathlene November, MD, 25 mg at 05/07/22 1245   multivitamin with minerals tablet 1 tablet, 1 tablet, Oral, Daily, Adhikari, Amrit, MD   ondansetron (ZOFRAN) tablet 4 mg, 4 mg, Oral, Q6H PRN **OR** ondansetron (ZOFRAN) injection 4 mg, 4 mg, Intravenous, Q6H PRN, Athena Masse, MD    ALLERGIES   Penicillins     REVIEW OF SYSTEMS    Review of Systems:  Gen:  Denies  fever, sweats, chills weigh loss  HEENT: Denies blurred vision, double vision, ear pain, eye pain, hearing loss, nose bleeds, sore throat Cardiac:  No dizziness, chest pain or heaviness, chest tightness,edema Resp:   reports dyspnea chronically  Gi: Denies swallowing difficulty, stomach pain, nausea or vomiting, diarrhea, constipation, bowel incontinence Gu:  Denies bladder incontinence, burning urine Ext:   Denies Joint pain, stiffness or swelling Skin: Denies  skin rash, easy bruising or bleeding or hives Endoc:  Denies polyuria, polydipsia , polyphagia or weight change Psych:   Denies depression, insomnia or hallucinations   Other:  All other systems negative   VS: BP (!) 146/54   Pulse 83   Temp 98.1 F (36.7 C) (Oral)   Resp 18   Ht '6\' 1"'$  (1.854 m)   Wt 96.3 kg   SpO2 92%   BMI 28.01 kg/m      PHYSICAL EXAM    GENERAL:NAD, no fevers, chills, no weakness no fatigue HEAD: Normocephalic, atraumatic.  EYES: Pupils equal, round, reactive to light. Extraocular muscles intact. No scleral icterus.  MOUTH: Moist mucosal membrane. Dentition intact. No abscess noted.  EAR, NOSE, THROAT: Clear without exudates. No external lesions.  NECK: Supple. No thyromegaly. No nodules. No JVD.  PULMONARY: decreased breath sounds with mild rhonchi worse at bases bilaterally.  CARDIOVASCULAR: S1 and S2. Regular rate and rhythm. No murmurs, rubs, or gallops. No edema. Pedal  pulses 2+ bilaterally.  GASTROINTESTINAL: Soft, nontender, nondistended. No masses. Positive bowel sounds. No hepatosplenomegaly.  MUSCULOSKELETAL: No swelling, clubbing, or edema. Range of motion full in all extremities.  NEUROLOGIC: Cranial nerves II through XII are intact. No gross focal neurological deficits. Sensation intact. Reflexes intact.  SKIN: No ulceration, lesions, rashes, or cyanosis. Skin warm and dry. Turgor intact.  PSYCHIATRIC: Mood, affect within normal limits. The patient is awake, alert and oriented x 3. Insight, judgment intact.       IMAGING   Reviewed CTPE performed in ER during this admission   ASSESSMENT/PLAN   Acute on chronic hypoxemic respiratory failure    - overall presentation seems to be more consistent with viral LRTI induced mild exacerbation of CHF    - I reviewed his CT chest there are bronchitic changes bilaterally , this will be treated with short course of steroids    -  he has cardiology evaluation and TTE in progress and this will be good to optimize underlying cardiac dysfunction    -Patient has stable chronic COPD without excess mucus production or darkened inspissated expectoration of phlegm    -starting Solumedrol '60mg'$  IV x 1 day then will transition to PO prednisone in AM      Additional active comorbid conditions:     -Advanced COPD and chronic hypoxemia      -Acute on chronic HFpEF     -Metabolic syndrome     -s/p TAVR     -Hypothyroidism     - Advanced Age     - Deconditioning      - GI/DVT ppx      Thank you for allowing me to participate in the care of this patient.  Patient/Family are satisfied with care plan and all questions have been answered.    Provider disclosure: Patient with at least one acute or chronic illness or injury that poses a threat to life or bodily function and is being managed actively during this encounter.  All of the below services have been performed independently by signing provider:  review of  prior documentation from internal and or external health records.  Review of previous and current lab results.  Interview and comprehensive assessment during patient visit today. Review of current and previous chest radiographs/CT scans. Discussion of management and test interpretation with health care team and patient/family.   This document was prepared using Dragon voice recognition software and may include unintentional dictation errors.     Ottie Glazier, M.D.  Division of Pulmonary & Critical Care Medicine

## 2022-05-07 NOTE — Consult Note (Signed)
Cardiology Consultation   [name] MRN: [id number]; DOB: 11-15-1945  Admit date: 05/06/2022 Date of Consult: 05/07/2022  PCP:  [facility] [place] [facility] Cardiologist:  Ida Rogue, MD   {  Patient Profile:   Kenneth Larsen is a 76 y.o. male with a hx of hypertension, COPD, tobacco history, chronic diastolic CHF, severe AS status post TAVR in 11/11/2018 who is being seen 05/07/2022 for the evaluation of heart failure at the request of Dr. Tawanna Solo.  History of Present Illness:   Kenneth Larsen is followed by Dr. Rockey Situ for the above cardiac issues.  The patient was admitted to [facility] in February 2020 with shortness of breath and lower extremity edema.  He was felt to have acute diastolic heart failure, COPD exacerbation and tested positive for influenza.  He improved with diuresis and was sent home.  He continued to have shortness of breath with mild exertion and fatigue.  He had an echo done 10/10/2018 showing a trileaflet aortic valve with severe calcification and restricted mobility of the leaflets, mean gradient 40 mmHg, LVEF 50 to 55%.  Evaluated by the heart valve team in 2020.  Preop heart cath showed mild nonobstructive coronary artery disease.  And underwent successful TAVR with Edwards SAPIEN 3 THV using a transfemoral approach.  Postop echo showed EF 50 to 55%, normal EF functioning TAVR with a mean gradient of 11 mmHg and no PVL.  He was last seen 12/30/2021 and was overall doing well.  Patient presented to [facility] ED 05/06/2022 for shortness of breath.  Family reported worsening shortness of breath and lower leg edema for the last 2 weeks. Patient was taking lasix '40mg'$  daily. He has also been using OTC inhalers frequently. The patient reported weight gain, but is not sure how much. EMS found him to be hypoxic to 85% on room air and was placed on nasal cannula with improvement. Apparently patient needs O2 at night, but was not using it  nightly. He denies chest pain, fever, chills.  In the ER blood pressure 164/85, pulse rate 92, respiratory rate 22, afebrile.  Labs showed sodium 129, chloride 90, glucose 131, BNP 255.  High-sensitivity troponin 31.  Chest x-ray showed no active cardiopulmonary disease.  Chest CTA was negative for PE.  Patient was given IV Lasix and admitted for further work-up.  Past Medical History:  Diagnosis Date   Arthritis    Cancer ([place])    2010-melanoma   Chronic diastolic (congestive) heart failure (HCC)    COPD (chronic obstructive pulmonary disease) ([place])    Hypertension    Hypothyroidism    Severe aortic stenosis     Past Surgical History:  Procedure Laterality Date   CARDIAC CATHETERIZATION     RIGHT/LEFT HEART CATH AND CORONARY ANGIOGRAPHY N/A 10/13/2018   Procedure: RIGHT/LEFT HEART CATH AND CORONARY ANGIOGRAPHY;  Surgeon: Nelva Bush, MD;  Location: [place] [facility];  Service: Cardiovascular;  Laterality: N/A;   TEE WITHOUT CARDIOVERSION N/A 11/15/2018   Procedure: TRANSESOPHAGEAL ECHOCARDIOGRAM (TEE);  Surgeon: Burnell Blanks, MD;  Location: [place] [facility];  Service: Open Heart Surgery;  Laterality: N/A;   TRANSCATHETER AORTIC VALVE REPLACEMENT, TRANSFEMORAL  11/15/2018   TRANSCATHETER AORTIC VALVE REPLACEMENT, TRANSFEMORAL N/A 11/15/2018   Procedure: TRANSCATHETER AORTIC VALVE REPLACEMENT, TRANSFEMORAL;  Surgeon: Burnell Blanks, MD;  Location: [place] [facility];  Service: Open Heart Surgery;  Laterality: N/A;   VASECTOMY       Home Medications:  Prior to Admission medications   Medication Sig Start Date End Date Taking? Authorizing Provider  aspirin EC 81 MG EC tablet Take 1 tablet (81 mg total) by mouth daily. 10/15/18  Yes [name], MD  Cholecalciferol (VITAMIN D-1000 MAX ST) 25 MCG (1000 UT) tablet Take 1,000 Units by mouth daily.    Yes [provider]  furosemide (LASIX) 40 MG tablet Take 1 tablet (40 mg total) by mouth daily. 10/14/18  05/06/22 Yes [name], MD  levothyroxine (SYNTHROID, LEVOTHROID) 50 MCG tablet Take 50 mcg by mouth daily before breakfast.    Yes [provider]  vitamin B-12 (CYANOCOBALAMIN) 1000 MCG tablet Take 1,000 mcg by mouth daily.   Yes [provider]    Inpatient Medications: Scheduled Meds:  aspirin EC  81 mg Oral Daily   enoxaparin (LOVENOX) injection  40 mg Subcutaneous Q24H   furosemide  40 mg Intravenous Q12H   levothyroxine  50 mcg Oral QAC breakfast   Continuous Infusions:  PRN Meds: acetaminophen **OR** acetaminophen, ipratropium-albuterol, ondansetron **OR** ondansetron (ZOFRAN) IV  Allergies:    Allergies  Allergen Reactions   Penicillins Hives    Did it involve swelling of the face/tongue/throat, SOB, or low BP? No Did it involve sudden or severe rash/hives, skin peeling, or any reaction on the inside of your mouth or nose? No Did you need to seek medical attention at a hospital or doctor's office? No When did it last happen?      childhood allergy If all above answers are "NO", may proceed with cephalosporin use.     Social History:   Social History   Socioeconomic History   Marital status: Married    Spouse name: Not on file   Number of children: 3   Years of education: Not on file   Highest education level: Not on file  Occupational History   Occupation: Retired-Textiles  Tobacco Use   Smoking status: Former    Packs/day: 1.00    Years: 25.00    Total pack years: 25.00    Types: Cigarettes   Smokeless tobacco: Never  Vaping Use   Vaping Use: Never used  Substance and Sexual Activity   Alcohol use: Never   Drug use: Never   Sexual activity: Not on file  Other Topics Concern   Not on file  Social History Narrative   Not on file   Social Determinants of Health   Financial Resource Strain: Not on file  Food Insecurity: No Food Insecurity (05/06/2022)   Hunger Vital Sign    Worried About Running Out of Food in the Last Year: Never  true    Ran Out of Food in the Last Year: Never true  Transportation Needs: No Transportation Needs (05/06/2022)   PRAPARE - Hydrologist (Medical): No    Lack of Transportation (Non-Medical): No  Physical Activity: Not on file  Stress: Not on file  Social Connections: Not on file  Intimate Partner Violence: Not At Risk (05/06/2022)   Humiliation, Afraid, Rape, and Kick questionnaire    Fear of Current or Ex-Partner: No    Emotionally Abused: No    Physically Abused: No    Sexually Abused: No    Family History:    Family History  Problem Relation Age of Onset   Cancer Father      ROS:  Please see the history of present illness.   All other ROS reviewed and negative.     Physical Exam/Data:  Vitals:   05/06/22 2200 05/06/22 2332 05/07/22 0435 05/07/22 0500  BP:  (!) 151/74 (!) 140/70   Pulse:  76 78   Resp:  20 18   Temp:  98.4 F (36.9 C) 97.8 F (36.6 C)   TempSrc:  Oral Oral   SpO2:  97% 97%   Weight:    96.3 kg  Height: '6\' 1"'$  (1.854 m)       Intake/Output Summary (Last 24 hours) at 05/07/2022 0805 Last data filed at 05/07/2022 0615 Gross per 24 hour  Intake --  Output 1550 ml  Net -1550 ml      05/07/2022    5:00 AM 05/06/2022    3:30 PM 12/30/2021    9:45 AM  Last 3 Weights  Weight (lbs) 212 lb 4.9 oz 220 lb 218 lb 8 oz  Weight (kg) 96.3 kg 99.791 kg 99.111 kg     Body mass index is 28.01 kg/m.  General:  Well nourished, well developed, in no acute distress HEENT: normal Neck: no JVD Vascular: No carotid bruits; Distal pulses 2+ bilaterally Cardiac:  normal S1, S2; RRR; no murmur  Lungs:  diffusely diminished Abd: soft, nontender, no hepatomegaly  Ext: trace edema Musculoskeletal:  No deformities, BUE and BLE strength normal and equal Skin: warm and dry  Neuro:  CNs 2-12 intact, no focal abnormalities noted Psych:  Normal affect   EKG:  The EKG was personally reviewed and demonstrates: normal sinus rhythm, 93 bpm,  PACs, PVCs, LVH with repull, nonspecific ST-T wave changes  Telemetry:  Telemetry was personally reviewed and demonstrates:  NSR HR 70s, PVCs  Relevant CV Studies:  Echo 05/2021 1. Left ventricular ejection fraction, by estimation, is 60 to 65%. The  left ventricle has normal function. The left ventricle has no regional  wall motion abnormalities. Left ventricular diastolic parameters are  consistent with Grade I diastolic  dysfunction (impaired relaxation).   2. Right ventricular systolic function is normal. The right ventricular  size is normal. Tricuspid regurgitation signal is inadequate for assessing  PA pressure.   3. Left atrial size was moderately dilated.   4. The mitral valve is normal in structure. No evidence of mitral valve  regurgitation. No evidence of mitral stenosis.   5. The aortic valve was not well visualized. Aortic valve regurgitation  is not visualized. No aortic stenosis is present. There is a 29 mm Sapien  prosthetic (TAVR) valve present in the aortic position. Procedure Date:  11/15/18. Aortic valve area, by VTI  measures 2.72 cm. Aortic valve mean gradient measures 7.5 mmHg. Aortic  valve Vmax measures 2.13 m/s.   6. The inferior vena cava is normal in size with greater than 50%  respiratory variability, suggesting right atrial pressure of 3 mmHg.   7. Challenging images   Comparison(s): Previous AV velocities reported as 23 peak PG, 12 mean PG.   Cardiac cath 09/2018 RIGHT/LEFT HEART CATH AND CORONARY ANGIOGRAPHY   Conclusion  Conclusions: Mild to moderate, non-obstructive coronary artery disease. Moderately elevated left heart, right heart, and pulmonary artery pressures. Normal Fick cardiac output/index.   Recommendations: Medical therapy and risk factor modification to prevent progression of moderate coronary artery disease. Restart diuresis this afternoon; will initiate furosemide 40 mg IV BID. Outpatient TAVR clinic referral for further  workup/management of severe aortic stenosis.   Nelva Bush, MD [facility] Pager: [phone]   Recommendations  Antiplatelet/Anticoag Recommend Aspirin '81mg'$  daily for moderate CAD.     Laboratory Data:  High Sensitivity  Troponin:   Recent Labs  Lab 05/06/22 1746 05/06/22 2013  TROPONINIHS 31* 30*     Chemistry Recent Labs  Lab 05/06/22 1532 05/06/22 2012 05/07/22 0521  NA 129*  --  132*  K 3.8  --  3.7  CL 90*  --  88*  CO2 32  --  36*  GLUCOSE 131*  --  100*  BUN 12  --  9  CREATININE 0.80 0.72 0.78  CALCIUM 8.4*  --  8.3*  GFRNONAA >60 >60 >60  ANIONGAP 7  --  8    Recent Labs  Lab 05/06/22 1532  PROT 6.4*  ALBUMIN 3.6  AST 15  ALT 10  ALKPHOS 59  BILITOT 1.2   Lipids No results for input(s): "CHOL", "TRIG", "HDL", "LABVLDL", "LDLCALC", "CHOLHDL" in the last 168 hours.  Hematology Recent Labs  Lab 05/06/22 1532 05/06/22 2059  WBC 7.1 6.6  RBC 5.03 4.73  HGB 15.2 14.3  HCT 47.2 44.7  MCV 93.8 94.5  MCH 30.2 30.2  MCHC 32.2 32.0  RDW 13.0 12.9  PLT 258 236   Thyroid  Recent Labs  Lab 05/06/22 2012  TSH 2.137    BNP Recent Labs  Lab 05/06/22 1532  BNP 255.5*    DDimer No results for input(s): "DDIMER" in the last 168 hours.   Radiology/Studies:  CT Angio Chest PE W/Cm &/Or Wo Cm  Result Date: 05/06/2022 CLINICAL DATA:  Shortness of breath, weakness. Pulmonary embolism (PE) suspected, high prob EXAM: CT ANGIOGRAPHY CHEST WITH CONTRAST TECHNIQUE: Multidetector CT imaging of the chest was performed using the standard protocol during bolus administration of intravenous contrast. Multiplanar CT image reconstructions and MIPs were obtained to evaluate the vascular anatomy. RADIATION DOSE REDUCTION: This exam was performed according to the departmental dose-optimization program which includes automated exposure control, adjustment of the mA and/or kV according to patient size and/or use of iterative reconstruction technique.  CONTRAST:  10m OMNIPAQUE IOHEXOL 350 MG/ML SOLN COMPARISON:  11/15/2019 FINDINGS: Cardiovascular: No filling defects in the pulmonary arteries to suggest pulmonary emboli. Heart is normal size. Aorta is normal caliber. Coronary artery and aortic calcifications. Aortic valve repair. Mediastinum/Nodes: No mediastinal, hilar, or axillary adenopathy. Trachea and esophagus are unremarkable. Thyroid unremarkable. Lungs/Pleura: Mild elevation of the left hemidiaphragm. No confluent airspace opacities or effusions. Upper Abdomen: Imaging into the upper abdomen demonstrates no acute findings. Musculoskeletal: Chest wall soft tissues are unremarkable. No acute bony abnormality. Review of the MIP images confirms the above findings. IMPRESSION: No evidence of pulmonary embolus. No acute cardiopulmonary disease. Coronary artery disease. Aortic Atherosclerosis (ICD10-I70.0). Electronically Signed   By: KRolm BaptiseM.D.   On: 05/06/2022 18:47   DG Chest 2 View  Result Date: 05/06/2022 CLINICAL DATA:  Suspected sepsis EXAM: CHEST - 2 VIEW COMPARISON:  CT chest 11/15/2019 FINDINGS: Postprocedural changes from prior aortic valve replacement. No pleural effusion. No pneumothorax. Asymmetric elevation of the left hemidiaphragm, unchanged compared to prior exam. No focal airspace opacity. Unchanged cardiac and mediastinal contours. There is evidence of pulmonary venous congestion without evidence of overt pulmonary edema. Gas distended stomach. No acute osseous abnormality. IMPRESSION: No active cardiopulmonary disease. Electronically Signed   By: HMarin RobertsM.D.   On: 05/06/2022 16:16     Assessment and Plan:   Acute on chronic diastolic heart failure - BNP 255 - IV lasix '40mg'$  BID - PTA lasix '40mg'$  daily - Echo 2022 showed LVEF 55% with normally functioning valve - UOP -1.5L - Kidney function stable -  he is on 3L O2, not on O2 at baseline. Wean as able - repeat echo performed - volume status improving - consider  starting GDMT during admission - continue to monitor kidney function, daily weights, and creatinine with diuresis. Patient needs to take daily weights at home  Acute respiratory failure with hypoxia and hypercapnia - requiring up to 6L, now down to 3L - CHF and COPD contributing to breathing issues - CTA chest negative for PE - Question of needing O2 at night, may need to see pulmonary as outpatient  Severe AS s/p TAVR in 2020 - repeat echo ordered  Elevated troponin - minimally elevated to the 30s in the setting of acute heart failure - suspect demand ischemia in the setting of acute heart failure and respiratory failure - No chest pain reported - Cath in 2020 showed mild nonobstructive CAD - continue ASA  Hyponatremia - in the setting of volume overload - improving with diuresis  COPD with mild exacerbation - treated with Duonebs in the ED - will need follow-up in the outpatient setting  For questions or updates, please contact [place] Please consult [email or web address] for contact info under    Signed, Marilu Rylander Ninfa Meeker, PA-C  05/07/2022 8:05 AM

## 2022-05-07 NOTE — Evaluation (Signed)
Occupational Therapy Evaluation Patient Details Name: Kenneth Larsen MRN: [id number] DOB: 24-Sep-1945 Today's Date: 05/07/2022   History of Present Illness Pt is a 76 year old male with history of hypertension, hypothyroidism, previous smoker, COPD, diastolic CHF, aortic stenosis status post TAVR who presented with 3-day history of shortness of breath, weakness, mild lower extremity edema.   Clinical Impression   Pt was seen for OT evaluation this date. Prior to hospital admission, pt was independent with ADL and IADL, working in the garden and doing yard work. Pt lives with his spouse in a 1 story home with ramped entrance option. Pt presents to acute OT demonstrating impaired ADL performance and functional mobility 2/2 impaired cardiopulmonary status requiring 4-5L O2 (was able to wean off 2L O2 in June 2023 per pt in conjunction with pt's MD), activity tolerance, and BLE strength (See OT problem list). Pt currently requires CGA for ADL transfers with RW, CGA-SBA for ADL in standing and noted to desat to 88% on 4L after exertional activity requiring bump to 5L O2 briefly to improve to 91-93%. Left on 4L at end of session. Pt/spouse educated in PLB to support breath recovery after exertional activity. Pt/spouse educated in energy conservation strategies including activity pacing, work simplification, falls prevention, home/routines modifications for ADL/IADL, and AE/DME to maximize safety/indep. Pt/spouse verbalized understanding. Pt would benefit from skilled OT services to address noted impairments and functional limitations (see below for any additional details) in order to maximize safety and independence while minimizing falls risk and caregiver burden. Upon hospital discharge, recommend HHOT to maximize pt safety and return to functional independence during meaningful occupations of daily life.     Recommendations for follow up therapy are one component of a multi-disciplinary discharge planning  process, led by the attending physician.  Recommendations may be updated based on patient status, additional functional criteria and insurance authorization.   Follow Up Recommendations  Home health OT    Assistance Recommended at Discharge PRN  Patient can return home with the following A little help with walking and/or transfers;Assistance with cooking/housework;A little help with bathing/dressing/bathroom    Functional Status Assessment  Patient has had a recent decline in their functional status and demonstrates the ability to make significant improvements in function in a reasonable and predictable amount of time.  Equipment Recommendations  None recommended by OT    Recommendations for Other Services       Precautions / Restrictions Precautions Precautions: Fall Restrictions Weight Bearing Restrictions: No      Mobility Bed Mobility Overal bed mobility: Modified Independent                  Transfers Overall transfer level: Needs assistance Equipment used: Rolling walker (2 wheels) Transfers: Sit to/from Stand Sit to Stand: Min guard                  Balance Overall balance assessment: Needs assistance Sitting-balance support: Feet supported, No upper extremity supported Sitting balance-Leahy Scale: Good       Standing balance-Leahy Scale: Fair                             ADL either performed or assessed with clinical judgement   ADL                                         General  ADL Comments: Pt requires SBA-CGA for ADL in standing with RW, increased time/effort to complete LB ADL tasks and monitoring for desats     Vision         Perception     Praxis      Pertinent Vitals/Pain Pain Assessment Pain Assessment: No/denies pain     Hand Dominance     Extremity/Trunk Assessment Upper Extremity Assessment Upper Extremity Assessment: Overall WFL for tasks assessed   Lower Extremity Assessment Lower  Extremity Assessment: Generalized weakness   Cervical / Trunk Assessment Cervical / Trunk Assessment: Normal   Communication Communication Communication: No difficulties   Cognition Arousal/Alertness: Awake/alert Behavior During Therapy: WFL for tasks assessed/performed Overall Cognitive Status: Within Functional Limits for tasks assessed                                       General Comments       Exercises Other Exercises Other Exercises: Pt/spouse educated in energy conservation strategies including activity pacing, work simplification, falls prevention, home/routines modifications for ADL/IADL, and AE/DME to maximize safety/indep. Other Exercises: Pt just returned from ambulating around nursing unit with mobility specialists upon OT evaluation. Seated EOB, 4L O2, SpO2 88%. Pt denied SOB. O2 increased to 5L briefly and pt quickly improved to 92-93%, left on 4L at 93%. Pt educated in PLB to support breath recovery.   Shoulder Instructions      Home Living Family/patient expects to be discharged to:: Private residence Living Arrangements: Spouse/significant other Available Help at Discharge: Family Type of Home: House Home Access: Stairs to enter;Ramped entrance Entrance Stairs-Number of Steps: 1.5 Entrance Stairs-Rails: Right Home Layout: One level               Home Equipment: Conservation officer, nature (2 wheels);Rollator (4 wheels)   Additional Comments: 1 fall two weeks ago with yard work      Prior Functioning/Environment Prior Level of Function : Independent/Modified Independent;Driving               ADLs Comments: enjoys Haematologist (Engineer, building services) and gardening, 1 fall in yard in past 41mo generally indep with ADL and IADL        OT Problem List: Decreased strength;Decreased activity tolerance;Decreased knowledge of use of DME or AE      OT Treatment/Interventions: Self-care/ADL training;Therapeutic activities;Therapeutic exercise;Energy  conservation;Patient/family education;DME and/or AE instruction    OT Goals(Current goals can be found in the care plan section) Acute Rehab OT Goals Patient Stated Goal: get better and go home OT Goal Formulation: With patient/family Time For Goal Achievement: 05/21/22 Potential to Achieve Goals: Good ADL Goals Additional ADL Goal #1: Pt will complete all aspects of bathing, sitting/standing, with mod indep while maintainin SpO2 >90% on O2 vs RA per progress, 2/2 opportunities. Additional ADL Goal #2: Pt will verbalize plan to implement at least 2 learned energy conservation strategies into daily ADL/IADL routines to maximize safety/indep.  OT Frequency: Min 2X/week    Co-evaluation              AM-PAC OT "6 Clicks" Daily Activity     Outcome Measure Help from another person eating meals?: None Help from another person taking care of personal grooming?: None Help from another person toileting, which includes using toliet, bedpan, or urinal?: A Little Help from another person bathing (including washing, rinsing, drying)?: A Little Help from another person to put on and taking off  regular upper body clothing?: None Help from another person to put on and taking off regular lower body clothing?: A Little 6 Click Score: 21   End of Session    Activity Tolerance: Patient tolerated treatment well Patient left: in bed;with call bell/phone within reach;with family/visitor present (seated EOB)  OT Visit Diagnosis: Other abnormalities of gait and mobility (R26.89);Muscle weakness (generalized) (M62.81);History of falling (Z91.81)                Time: 9037-9558 OT Time Calculation (min): 19 min Charges:  OT General Charges $OT Visit: 1 Visit OT Evaluation $OT Eval Low Complexity: 1 Low OT Treatments $Self Care/Home Management : 8-22 mins  Ardeth Perfect., MPH, MS, OTR/L ascom [phone] 05/07/22, 5:08 PM

## 2022-05-07 NOTE — Progress Notes (Signed)
Initial Nutrition Assessment  DOCUMENTATION CODES:   Not applicable  INTERVENTION:   -Liberalize diet to 2 gram sodium for wider variety of meal selections -Ensure Enlive po BID, each supplement provides 350 kcal and 20 grams of protein -MVI with minerals daily -Provided "Low Sodium Nutrition Therapy" handout from [facility] [facility]' attached to AVS/ discharge summary   NUTRITION DIAGNOSIS:   Increased nutrient needs related to chronic illness (CHF) as evidenced by estimated needs.  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  Pt admitted with acute respiratory failure and CHF.   Reviewed I/O's: -1.6 L x 24 hours   UOP: 1.6 L x 24 hours  Pt unavailable at time of visit. Attempted to speak with pt via call to hospital room phone, however, unable to reach. RD unable to obtain further nutrition-related history or complete nutrition-focused physical exam at this time.     Pt is currently on a heart healthy diet. No meal completion data available to assess at this time.   Reviewed wt hx; pt has experienced a 2.8% wt loss over the past 4 months, which is not significant for time frame. Suspect edema may be masking further weight loss as well as fat and muscle depletions.   Pt with increased nutritional needs and would benefit from addition of oral nutrition supplements.   Medications reviewed and include lasix.   Labs reviewed: Na: 132.    Diet Order:   Diet Order             Diet Heart Room service appropriate? Yes; Fluid consistency: Thin  Diet  effective now                   EDUCATION NEEDS:   No education needs have been identified at this time  Skin:  Skin Assessment: Reviewed RN Assessment  Last BM:  05/04/22  Height:   Ht Readings from Last 1 Encounters:  05/06/22 '6\' 1"'$  (1.854 m)    Weight:   Wt Readings from Last 1 Encounters:  05/07/22 96.3 kg    Ideal Body Weight:  83.6 kg  BMI:  Body mass index is 28.01 kg/m.  Estimated Nutritional Needs:   Kcal:  2300-2500  Protein:  110-125 grams  Fluid:  > 2 L    Loistine Chance, RD, LDN, [place] Registered Dietitian II Certified Diabetes Care and Education Specialist Please refer to [facility] for RD and/or RD on-call/weekend/after hours pager

## 2022-05-07 NOTE — Discharge Instructions (Signed)

## 2022-05-07 NOTE — Evaluation (Signed)
Physical Therapy Evaluation Patient Details Name: Kenneth Larsen MRN: [id number] DOB: 08/21/1946 Today's Date: 05/07/2022  History of Present Illness  Pt is a 76 year old male with history of hypertension, hypothyroidism, previous smoker, COPD, diastolic CHF, aortic stenosis status post TAVR who presented with 3-day history of shortness of breath, weakness, mild lower extremity edema.   Clinical Impression  Patient alert, agreeable to PT. Reported at baseline he is independent, 1 fall in the last two weeks during yard work.  He was able to perform bed mobility modI, use of bed rails. Sit <> stand from low surface with RW and CGA, cued for hand placement. 2 attempts to complete due to low surface, and pt did have a small posterior LOB with minA to correct with initial standing. He ambulated ~222f with RW and CGA, 2 Pt led standing rest breaks, spO2 ranging from 87-90% on 4L, placed on 6L for remainder of walk (89-91%). Returned to 4L at 90% at end of session.  Overall the patient demonstrated deficits (see "PT Problem List") that impede the patient's functional abilities, safety, and mobility and would benefit from skilled PT intervention. Recommendation at this time is HHPT with intermittent supervision/assistance to maximize safety and return to PLOF.         Recommendations for follow up therapy are one component of a multi-disciplinary discharge planning process, led by the attending physician.  Recommendations may be updated based on patient status, additional functional criteria and insurance authorization.  Follow Up Recommendations Home health PT      Assistance Recommended at Discharge Intermittent Supervision/Assistance  Patient can return home with the following  A little help with walking and/or transfers;A little help with bathing/dressing/bathroom;Assistance with cooking/housework;Assist for transportation;Help with stairs or ramp for entrance    Equipment Recommendations None  recommended by PT (pt has RW and rollator at home)  Recommendations for Other Services       Functional Status Assessment Patient has had a recent decline in their functional status and demonstrates the ability to make significant improvements in function in a reasonable and predictable amount of time.     Precautions / Restrictions Precautions Precautions: Fall Restrictions Weight Bearing Restrictions: No      Mobility  Bed Mobility Overal bed mobility: Modified Independent                  Transfers Overall transfer level: Needs assistance Equipment used: Rolling walker (2 wheels) Transfers: Sit to/from Stand Sit to Stand: Min guard, Min assist           General transfer comment: 2 attempts to complete due to low surface. did have a small posterior LOB with minA to correct    Ambulation/Gait Ambulation/Gait assistance: Min guard Gait Distance (Feet): 220 Feet Assistive device: Rolling walker (2 wheels)         General Gait Details: 1-2 mild LOB with staggering steps to address (to the R each time). pt able to correct without physical assist  Stairs            Wheelchair Mobility    Modified Rankin (Stroke Patients Only)       Balance Overall balance assessment: Needs assistance   Sitting balance-Leahy Scale: Good       Standing balance-Leahy Scale: Fair                               Pertinent Vitals/Pain Pain Assessment Pain Assessment: No/denies pain  Home Living Family/patient expects to be discharged to:: Private residence Living Arrangements: Spouse/significant other Available Help at Discharge: Family Type of Home: House Home Access: Stairs to enter;Ramped entrance Entrance Stairs-Rails: Right Entrance Stairs-Number of Steps: 1.5   Home Layout: One [place]: Conservation officer, nature (2 wheels);Rollator (4 wheels) Additional Comments: 1 fall two weeks ago with yard work    Prior Function Prior Level of  Function : Independent/Modified Independent;Driving                     Journalist, newspaper        Extremity/Trunk Assessment   Upper Extremity Assessment Upper Extremity Assessment: Overall WFL for tasks assessed    Lower Extremity Assessment Lower Extremity Assessment: Generalized weakness    Cervical / Trunk Assessment Cervical / Trunk Assessment: Normal  Communication   Communication: No difficulties  Cognition Arousal/Alertness: Awake/alert Behavior During Therapy: WFL for tasks assessed/performed Overall Cognitive Status: Within Functional Limits for tasks assessed                                          General Comments      Exercises     Assessment/Plan    PT Assessment Patient needs continued PT services  PT Problem List Decreased strength;Decreased mobility;Decreased range of motion;Decreased balance;Decreased activity tolerance;Decreased knowledge of use of DME       PT Treatment Interventions DME instruction;Therapeutic exercise;Gait training;Balance training;Stair training;Neuromuscular re-education;Functional mobility training;Therapeutic activities;Patient/family education    PT Goals (Current goals can be found in the Care Plan section)  Acute Rehab PT Goals Patient Stated Goal: to go home PT Goal Formulation: With patient Time For Goal Achievement: 05/21/22 Potential to Achieve Goals: Good    Frequency Min 2X/week     Co-evaluation               AM-PAC PT "6 Clicks" Mobility  Outcome Measure Help needed turning from your back to your side while in a flat bed without using bedrails?: None Help needed moving from lying on your back to sitting on the side of a flat bed without using bedrails?: None Help needed moving to and from a bed to a chair (including a wheelchair)?: None Help needed standing up from a chair using your arms (e.g., wheelchair or bedside chair)?: A Little Help needed to walk in hospital room?: A  Little Help needed climbing 3-5 steps with a railing? : A Little 6 Click Score: 21    End of Session Equipment Utilized During Treatment: Gait belt Activity Tolerance: Patient tolerated treatment well Patient left: in bed;with call bell/phone within reach;with family/visitor present (MD at bedside, pt sitting EOB in prep for breakfast) Nurse Communication: Mobility status;Other (comment) (O2) PT Visit Diagnosis: Other abnormalities of gait and mobility (R26.89);Difficulty in walking, not elsewhere classified (R26.2);Muscle weakness (generalized) (M62.81)    Time: 9242-6834 PT Time Calculation (min) (ACUTE ONLY): 18 min   Charges:   PT Evaluation $PT Eval Low Complexity: 1 Low PT Treatments $Therapeutic Activity: 8-22 mins        Lieutenant Diego PT, DPT 9:27 AM,05/07/22

## 2022-05-07 NOTE — Progress Notes (Signed)
Visited patient to provide heart failure education information, including 'Living Better with Heart Failure' booklet. Patient also educated about the role of the heart failure clinic in symptom management and heart failure medication management.  Patient verbalized understanding of checking daily weights at home, and maintain low sodium diet and fluid restriction.  Patient states he is not sure he will keep appointment to heart failure clinic until they know if insurance will cover appointment.  Patient did not wish to discuss information further, but states he will review heart failure and clinic information given so he and his spouse can review it and discuss it with their cardiologist to make a decision.  Georg Ruddle, RN

## 2022-05-07 NOTE — IPAL (Signed)
  Interdisciplinary Goals of Care Family Meeting   Date carried out: 05/07/2022  Location of the meeting: Bedside  Member's involved: Physician and Family Member or next of kin, wife  Durable Power of Attorney or Loss adjuster, chartered: patient    Discussion: We discussed goals of care for [employer] .    Code status: Full DNR  Disposition: Home  Time spent for the meeting: [place], MD  05/07/2022, 4:42 AM

## 2022-05-07 NOTE — Progress Notes (Addendum)
Brief same-day note  Patient is a 76 year old male with history of hypertension, hypothyroidism, previous smoker, COPD, diastolic CHF, aortic stenosis status post TAVR who presented with 3-day history of shortness of breath, weakness, mild lower extremity edema.  On presentation, he was hypertensive, tachypneic, requiring 5 L of oxygen.  COVID/flu screen test negative.  CT chest negative for PE without any acute pulmonary findings.  Patient was admitted for the management of acute and chronic diastolic CHF exacerbation along with COPD.  Started on IV lasix. Patient seen and examined at the bedside this morning.  During my evaluation, he looked very comfortable, eating his breakfast, on 4 L of oxygen.  Not in any kind of respiratory distress ,speaking on  full sentences.  Discussed planning with the son at the bedside  Brief assessment and plan:   Acute respiratory failure with hypoxia/hypercapnia: Presented with worsening dyspnea, increased work of breathing from home.  Requiring 5 to 6 L on presentation for maintenance of saturation.  Secondary to CHF exacerbation,possible  concurrent COPD exacerbation.  CT angio negative for PE.  Patient uses nighttime oxygen on him.  PFT showed PCO2 of 69. He might qualify for home oxygen.  We will try to wean the oxygen. Family  suspect that he may have underlying sleep apnea  Acute on chronic diastolic CHF: Last echo done in October 2022 showed EF of 60-65%, no regional wall motion abnormality, grade 1 diastolic dysfunction.  New echo pending.  Continue IV Lasix.  Cardiology also consulted.  BNP elevated.  His bilateral lower extremity edema has improved.  His lungs were clear on auscultation this morning  Hyponatremia: Likely from hypervolemic status from CHF.  Continue fluid restriction, diuretic therapy  COPD with exacerbation: Continue bronchodilators as needed.  Not wheezing at the time ,patient is ex-smoker. As per the request of her son who is an Therapist, sports, we  have requested for pulmonology consultation  Severe aortic stenosis: Status post TAVR  Hypertension: Monitor blood pressure.  Continue diuretic therapy  Hypothyroidism: Continue levothyroxine

## 2022-05-07 NOTE — Progress Notes (Signed)
*  PRELIMINARY RESULTS* Echocardiogram 2D Echocardiogram has been performed.  Sherrie Sport 05/07/2022, 8:36 AM

## 2022-05-07 NOTE — TOC Initial Note (Signed)
Transition of Care [facility]) - Initial/Assessment Note    Patient Details  Name: Kenneth Larsen MRN: [id number] Date of Birth: [birth year]  [facility] CM/SW Contact:    Alberteen Sam, LCSW Phone Number: 05/07/2022, 10:36 AM  Clinical Narrative:                  CSW met with patient and family at bedside, informed them of Home Health recommendations. They report they will decline HH at this time as there is a nurse in the family. They report they have a ramp going into the home, one level farm house with no carpet only hardwood floors. Reports they have a walker and all dme at home.   Patient was previously on night O2 through [facility] however has not worn in the past few months.   CSW notes patient currently on 5L O2 nasal cannula during day while in hospital, CSW will continue to follow for oxygen needs. If continues on daytime O2 at higher liter flow will need new O2 orders at dc and sent to [facility].    Family to transport home at discharge.    Expected Discharge Plan: Home/Self Care Barriers to Discharge: Continued Medical Work up   Patient Goals and CMS Choice Patient states their goals for this hospitalization and ongoing recovery are:: to go home CMS Medicare.gov Compare Post Acute Care list provided to:: Patient Choice offered to / list presented to : Patient  Expected Discharge Plan and Services Expected Discharge Plan: Home/Self Care       Living arrangements for the past 2 months: Single Family Home                                      Prior Living Arrangements/Services Living arrangements for the past 2 months: Single Family Home Lives with:: Relatives                   Activities of Daily Living Home Assistive Devices/Equipment: Dentures (specify type), Eyeglasses ADL Screening (condition at time of admission) Patient's cognitive ability adequate to safely complete daily activities?: Yes Is the patient deaf or have difficulty  hearing?: No Does the patient have difficulty seeing, even when wearing glasses/contacts?: No Does the patient have difficulty concentrating, remembering, or making decisions?: No Patient able to express need for assistance with ADLs?: No Does the patient have difficulty dressing or bathing?: No Independently performs ADLs?: Yes (appropriate for developmental age) Does the patient have difficulty walking or climbing stairs?: Yes Weakness of Legs: Both Weakness of Arms/Hands: None  Permission Sought/Granted                  Emotional Assessment       Orientation: : Oriented to Self, Oriented to Place, Oriented to  Time, Oriented to Situation Alcohol / Substance Use: Not Applicable Psych Involvement: No (comment)  Admission diagnosis:  Hypoxemia [R09.02] Acute respiratory failure with hypoxia (HCC) [J96.01] Dyspnea, unspecified type [R06.00] Patient Active Problem List   Diagnosis Date Noted   Acute respiratory failure with hypoxia and hypercapnia (HCC) 05/06/2022   Severe aortic stenosis S/P TAVR (transcatheter aortic valve replacement) 05/06/2022   Hyponatremia 05/06/2022   Elevated troponin 05/06/2022   COPD with mild acute exacerbation ([place]) 11/16/2018   Hypothyroidism 11/16/2018   Hypertension 11/16/2018   Acute on chronic diastolic CHF (congestive heart failure) ([place]) 11/16/2018   Acute heart failure with preserved  ejection fraction (HFpEF) ([place])    Severe aortic stenosis    Respiratory failure ([place]) 10/08/2018   PCP:  Center, [place]:   [facility] [address] [place] [address] [place]) [place] [address] Phone: [phone] Fax: [phone]     Social Determinants of Health (SDOH) Interventions    Readmission Risk Interventions     No data to display

## 2022-05-07 NOTE — Progress Notes (Signed)
Mobility Specialist - Progress Note   05/07/22 1550  Mobility  Activity Ambulated with assistance in hallway;Stood at bedside;Dangled on edge of bed  Level of Assistance Standby assist, set-up cues, supervision of patient - no hands on  Assistive Device Front wheel walker  Distance Ambulated (ft) 200 ft  Activity Response Tolerated well  $Mobility charge 1 Mobility   Pt semi-supine in bed on 5L on arrival. Pt STS indep and ambulates in hallway supervision. Pt returns to EOB with needs in reach and family in room.   Gretchen Short  Mobility Specialist  05/07/22 3:52 PM

## 2022-05-08 DIAGNOSIS — J9601 Acute respiratory failure with hypoxia: Secondary | ICD-10-CM | POA: Diagnosis not present

## 2022-05-08 DIAGNOSIS — J441 Chronic obstructive pulmonary disease with (acute) exacerbation: Secondary | ICD-10-CM | POA: Diagnosis not present

## 2022-05-08 DIAGNOSIS — R0902 Hypoxemia: Secondary | ICD-10-CM | POA: Insufficient documentation

## 2022-05-08 DIAGNOSIS — I5033 Acute on chronic diastolic (congestive) heart failure: Secondary | ICD-10-CM | POA: Diagnosis not present

## 2022-05-08 DIAGNOSIS — J9602 Acute respiratory failure with hypercapnia: Secondary | ICD-10-CM | POA: Diagnosis not present

## 2022-05-08 LAB — BASIC METABOLIC PANEL
Anion gap: 7 (ref 5–15)
BUN: 19 mg/dL (ref 8–23)
CO2: 41 mmol/L — ABNORMAL HIGH (ref 22–32)
Calcium: 9.2 mg/dL (ref 8.9–10.3)
Chloride: 85 mmol/L — ABNORMAL LOW (ref 98–111)
Creatinine, Ser: 1 mg/dL (ref 0.61–1.24)
GFR, Estimated: >60 mL/min (ref >60)
Glucose, Bld: 118 mg/dL — ABNORMAL HIGH (ref 70–99)
Potassium: 4.4 mmol/L (ref 3.5–5.1)
Sodium: 133 mmol/L — ABNORMAL LOW (ref 135–145)

## 2022-05-08 MED ORDER — SENNOSIDES-DOCUSATE SODIUM 8.6-50 MG PO TABS
1.0000 | ORAL_TABLET | Freq: Two times a day (BID) | ORAL | Status: DC
  Administered 2022-05-08 – 2022-05-11 (×5): 1 via ORAL
  Filled 2022-05-08 (×8): qty 1

## 2022-05-08 MED ORDER — POLYETHYLENE GLYCOL 3350 17 G PO PACK
17.0000 g | PACK | Freq: Every day | ORAL | Status: DC
  Administered 2022-05-08 – 2022-05-11 (×4): 17 g via ORAL
  Filled 2022-05-08 (×5): qty 1

## 2022-05-08 MED ORDER — PREDNISONE 50 MG PO TABS
50.0000 mg | ORAL_TABLET | Freq: Every day | ORAL | Status: DC
  Administered 2022-05-09: 50 mg via ORAL
  Filled 2022-05-08: qty 1

## 2022-05-08 MED ORDER — GUAIFENESIN ER 600 MG PO TB12
600.0000 mg | ORAL_TABLET | Freq: Two times a day (BID) | ORAL | Status: DC
  Administered 2022-05-08 – 2022-05-12 (×9): 600 mg via ORAL
  Filled 2022-05-08 (×9): qty 1

## 2022-05-08 NOTE — Progress Notes (Addendum)
Mobility Specialist - Progress Note   05/08/22 1010  Mobility  Activity Ambulated with assistance in hallway  Level of Assistance Standby assist, set-up cues, supervision of patient - no hands on  Assistive Device Front wheel walker  Distance Ambulated (ft) 200 ft  Activity Response Tolerated well  $Mobility charge 1 Mobility   Pre-mobility: HR = 91 SpO2 = 95  During mobility: HR = 81 SpO2 = 93 Post-mobility: HR = 77 SPO2 = 82  2 minutes Post-mobility: HR  = 93 SPO2 = 94   Pt sitting in chair on 3L upon arrival. PT STS and ambulates in hallway SBA, +2 for chair follow, gait is unsteady at times during ambulation. Pt has no complaints, RN notified of SPO2 drop. Pt returns to bed with needs in reach, family in room, and bed alarm set.   Gretchen Short  Mobility Specialist  05/08/22 10:15 AM

## 2022-05-08 NOTE — Progress Notes (Signed)
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  He was noted to be tachyarrythmic and dyspneic found to have hypoxemia requiring 5L/min [place].  He had blood work done in ER with mild elevation of BNP >250 mild hyponatremia with hypervolemic fluid status, mild hypertension, normal VBG on 5L O2, essentially normal CBC and viral workup was negative for covid, and flu.  He had CT chest which I reviewed with findings of bronchitic changes diffusely and bilaterally to moderate severity with absence of substantial pulmonary edema, pleural effusions, consolidated infiltrate negative for PE, no areas of frank honeycombing or pneumothorax. He was able to speak in full sentences and endorses chest discomfort unlike his normal dyspnea. He denies constitutional symptoms. PCCM consultation for further evaluation and management.   05/08/22- patient feels improved, his wife shares he had episode of resp distress overnight but she thinks this was due to O2 line kinking since patient was unable to appreciate any air flow through nasal canula.  We discussed being careful when moving around to NOT sit on or bend cordage for oxygen therapy.  He is cleared from pulmonary standpoint and is able to finish prednisone taper for bronchitis.  Please reduce prednisone by '5mg'$  daily.   PAST MEDICAL HISTORY   Past Medical History:  Diagnosis Date   Arthritis    Cancer ([place])    2010-melanoma   Chronic diastolic (congestive) heart  failure (HCC)    COPD (chronic obstructive pulmonary disease) ([place])    Hypertension    Hypothyroidism    Severe aortic stenosis      SURGICAL HISTORY   Past Surgical History:  Procedure Laterality Date   CARDIAC CATHETERIZATION     RIGHT/LEFT HEART CATH AND CORONARY ANGIOGRAPHY N/A 10/13/2018   Procedure: RIGHT/LEFT HEART CATH AND CORONARY ANGIOGRAPHY;  Surgeon: Nelva Bush, MD;  Location: [facility];  Service: Cardiovascular;  Laterality: N/A;   TEE WITHOUT CARDIOVERSION N/A 11/15/2018   Procedure: TRANSESOPHAGEAL ECHOCARDIOGRAM (TEE);  Surgeon: Burnell Blanks, MD;  Location: [facility];  Service: Open Heart Surgery;  Laterality: N/A;   TRANSCATHETER AORTIC VALVE REPLACEMENT, TRANSFEMORAL  11/15/2018   TRANSCATHETER AORTIC VALVE REPLACEMENT, TRANSFEMORAL N/A 11/15/2018   Procedure: TRANSCATHETER AORTIC VALVE REPLACEMENT, TRANSFEMORAL;  Surgeon: Burnell Blanks, MD;  Location: [facility];  Service: Open Heart Surgery;  Laterality: N/A;   VASECTOMY       FAMILY HISTORY   Family History  Problem Relation Age of Onset   Cancer Father      SOCIAL HISTORY   Social History   Tobacco Use   Smoking status: Former    Packs/day: 1.00    Years: 25.00    Total pack years: 25.00    Types: Cigarettes   Smokeless tobacco: Never  Vaping Use   Vaping Use: Never used  Substance Use Topics   Alcohol use: Never  Drug use: Never     MEDICATIONS    Home Medication:    Current Medication:  Current Facility-Administered Medications:    acetaminophen (TYLENOL) tablet 650 mg, 650 mg, Oral, Q6H PRN **OR** acetaminophen (TYLENOL) suppository 650 mg, 650 mg, Rectal, Q6H PRN, Athena Masse, MD   aspirin EC tablet 81 mg, 81 mg, Oral, Daily, Athena Masse, MD, 81 mg at 05/08/22 0817   docusate sodium (COLACE) capsule 100 mg, 100 mg, Oral, BID, Sharion Settler, NP, 100 mg at 05/08/22 0817   enoxaparin (LOVENOX) injection 40 mg, 40 mg,  Subcutaneous, Q24H, Athena Masse, MD, 40 mg at 05/07/22 2058   feeding supplement (ENSURE ENLIVE / ENSURE PLUS) liquid 237 mL, 237 mL, Oral, BID BM, Adhikari, Amrit, MD, 237 mL at 05/07/22 1134   furosemide (LASIX) injection 40 mg, 40 mg, Intravenous, Q12H, Athena Masse, MD, 40 mg at 05/08/22 0818   ipratropium-albuterol (DUONEB) 0.5-2.5 (3) MG/3ML nebulizer solution 3 mL, 3 mL, Nebulization, Q4H PRN, Athena Masse, MD   levothyroxine (SYNTHROID) tablet 50 mcg, 50 mcg, Oral, QAC breakfast, Athena Masse, MD, 50 mcg at 05/08/22 4098   losartan (COZAAR) tablet 50 mg, 50 mg, Oral, Daily, Rockey Situ, Kathlene November, MD, 50 mg at 05/08/22 0817   metoprolol succinate (TOPROL-XL) 24 hr tablet 25 mg, 25 mg, Oral, Daily, Rockey Situ, Kathlene November, MD, 25 mg at 05/08/22 1191   multivitamin with minerals tablet 1 tablet, 1 tablet, Oral, Daily, Shelly Coss, MD, 1 tablet at 05/08/22 0817   ondansetron (ZOFRAN) tablet 4 mg, 4 mg, Oral, Q6H PRN **OR** ondansetron (ZOFRAN) injection 4 mg, 4 mg, Intravenous, Q6H PRN, Athena Masse, MD   predniSONE (DELTASONE) tablet 50 mg, 50 mg, Oral, Q breakfast, Ottie Glazier, MD    ALLERGIES   Penicillins     REVIEW OF SYSTEMS    Review of Systems:  Gen:  Denies  fever, sweats, chills weigh loss  HEENT: Denies blurred vision, double vision, ear pain, eye pain, hearing loss, nose bleeds, sore throat Cardiac:  No dizziness, chest pain or heaviness, chest tightness,edema Resp:   reports dyspnea chronically  Gi: Denies swallowing difficulty, stomach pain, nausea or vomiting, diarrhea, constipation, bowel incontinence Gu:  Denies bladder incontinence, burning urine Ext:   Denies Joint pain, stiffness or swelling Skin: Denies  skin rash, easy bruising or bleeding or hives Endoc:  Denies polyuria, polydipsia , polyphagia or weight change Psych:   Denies depression, insomnia or hallucinations   Other:  All other systems negative   VS: BP (!) 114/55 (BP Location:  Right Arm)   Pulse 73   Temp 98.4 F (36.9 C)   Resp 17   Ht '6\' 1"'$  (1.854 m)   Wt 97.6 kg   SpO2 98%   BMI 28.39 kg/m      PHYSICAL EXAM    GENERAL:NAD, no fevers, chills, no weakness no fatigue HEAD: Normocephalic, atraumatic.  EYES: Pupils equal, round, reactive to light. Extraocular muscles intact. No scleral icterus.  MOUTH: Moist mucosal membrane. Dentition intact. No abscess noted.  EAR, NOSE, THROAT: Clear without exudates. No external lesions.  NECK: Supple. No thyromegaly. No nodules. No JVD.  PULMONARY: decreased breath sounds with mild rhonchi worse at bases bilaterally.  CARDIOVASCULAR: S1 and S2. Regular rate and rhythm. No murmurs, rubs, or gallops. No edema. Pedal pulses 2+ bilaterally.  GASTROINTESTINAL: Soft, nontender, nondistended. No masses. Positive bowel sounds. No hepatosplenomegaly.  MUSCULOSKELETAL: No swelling, clubbing, or edema. Range of motion full in all  extremities.  NEUROLOGIC: Cranial nerves II through XII are intact. No gross focal neurological deficits. Sensation intact. Reflexes intact.  SKIN: No ulceration, lesions, rashes, or cyanosis. Skin warm and dry. Turgor intact.  PSYCHIATRIC: Mood, affect within normal limits. The patient is awake, alert and oriented x 3. Insight, judgment intact.       IMAGING   Reviewed CTPE performed in ER during this admission   ASSESSMENT/PLAN   Acute on chronic hypoxemic respiratory failure    - overall presentation seems to be more consistent with viral LRTI induced mild exacerbation of CHF    - I reviewed his CT chest there are bronchitic changes bilaterally , this will be treated with short course of steroids    - he has cardiology evaluation and TTE in progress and this will be good to optimize underlying cardiac dysfunction    -Patient has stable chronic COPD without excess mucus production or darkened inspissated expectoration of phlegm    -starting Solumedrol '60mg'$  IV x 1 day then will transition to  PO prednisone from '50mg'$  down by '5mg'$  daily     -CXR in AM   Additional active comorbid conditions:     -Advanced COPD and chronic hypoxemia      -Acute on chronic HFpEF     -Metabolic syndrome     -s/p TAVR     -Hypothyroidism     - Advanced Age     - Deconditioning      - GI/DVT ppx      Thank you for allowing me to participate in the care of this patient.  Patient/Family are satisfied with care plan and all questions have been answered.    Provider disclosure: Patient with at least one acute or chronic illness or injury that poses a threat to life or bodily function and is being managed actively during this encounter.  All of the below services have been performed independently by signing provider:  review of prior documentation from internal and or external health records.  Review of previous and current lab results.  Interview and comprehensive assessment during patient visit today. Review of current and previous chest radiographs/CT scans. Discussion of management and test interpretation with health care team and patient/family.   This document was prepared using Dragon voice recognition software and may include unintentional dictation errors.     Ottie Glazier, M.D.  Division of Pulmonary & Critical Care Medicine

## 2022-05-08 NOTE — Progress Notes (Signed)
Rounding Note    Patient Name: Kenneth Larsen Date of Encounter: 05/08/2022  [facility] Cardiologist: Ida Rogue, MD   Subjective   Reports having significant coughing last night, eventually able to get mucus plugging up.  Long oxygen cord not working overnight, had to be changed out for new cord, shorter length.  Suspected kinking of the long cord. Walked with mobility specialist today, hypoxia with saturations down into the low 80s on exertion, quick recovery in 2 minutes  Inpatient Medications    Scheduled Meds:  aspirin EC  81 mg Oral Daily   docusate sodium  100 mg Oral BID   enoxaparin (LOVENOX) injection  40 mg Subcutaneous Q24H   feeding supplement  237 mL Oral BID BM   furosemide  40 mg Intravenous Q12H   guaiFENesin  600 mg Oral BID   levothyroxine  50 mcg Oral QAC breakfast   losartan  50 mg Oral Daily   metoprolol succinate  25 mg Oral Daily   multivitamin with minerals  1 tablet Oral Daily   polyethylene glycol  17 g Oral Daily   predniSONE  50 mg Oral Q breakfast   senna-docusate  1 tablet Oral BID   Continuous Infusions:  PRN Meds: acetaminophen **OR** acetaminophen, ipratropium-albuterol, ondansetron **OR** ondansetron (ZOFRAN) IV   Vital Signs    Vitals:   05/08/22 0449 05/08/22 0456 05/08/22 0600 05/08/22 0804  BP:  131/61  (!) 114/55  Pulse: 75 71 78 73  Resp:  19  17  Temp:  97.7 F (36.5 C)  98.4 F (36.9 C)  TempSrc:  Oral    SpO2: 91% 100% 92% 98%  Weight:      Height:        Intake/Output Summary (Last 24 hours) at 05/08/2022 1035 Last data filed at 05/08/2022 1021 Gross per 24 hour  Intake 960 ml  Output 1400 ml  Net -440 ml      05/08/2022    4:35 AM 05/07/2022    5:00 AM 05/06/2022    3:30 PM  Last 3 Weights  Weight (lbs) 215 lb 2.7 oz 212 lb 4.9 oz 220 lb  Weight (kg) 97.6 kg 96.3 kg 99.791 kg      Telemetry    Normal sinus rhythm- Personally Reviewed  ECG     - Personally Reviewed  Physical Exam    GEN: No acute distress.   Neck: No JVD Cardiac: RRR, no murmurs, rubs, or gallops.  Respiratory: Decreased breath sounds at the bases, poor air movement, scattered Rales GI: Soft, nontender, non-distended  MS: No edema; No deformity. Neuro:  Nonfocal  Psych: Normal affect   Labs    High Sensitivity Troponin:   Recent Labs  Lab 05/06/22 1746 05/06/22 2013  TROPONINIHS 31* 30*     Chemistry Recent Labs  Lab 05/06/22 1532 05/06/22 2012 05/07/22 0521 05/08/22 0528  NA 129*  --  132* 133*  K 3.8  --  3.7 4.4  CL 90*  --  88* 85*  CO2 32  --  36* 41*  GLUCOSE 131*  --  100* 118*  BUN 12  --  9 19  CREATININE 0.80 0.72 0.78 1.00  CALCIUM 8.4*  --  8.3* 9.2  PROT 6.4*  --   --   --   ALBUMIN 3.6  --   --   --   AST 15  --   --   --   ALT 10  --   --   --  ALKPHOS 59  --   --   --   BILITOT 1.2  --   --   --   GFRNONAA >60 >60 >60 >60  ANIONGAP 7  --  8 7    Lipids No results for input(s): "CHOL", "TRIG", "HDL", "LABVLDL", "LDLCALC", "CHOLHDL" in the last 168 hours.  Hematology Recent Labs  Lab 05/06/22 1532 05/06/22 2059  WBC 7.1 6.6  RBC 5.03 4.73  HGB 15.2 14.3  HCT 47.2 44.7  MCV 93.8 94.5  MCH 30.2 30.2  MCHC 32.2 32.0  RDW 13.0 12.9  PLT 258 236   Thyroid  Recent Labs  Lab 05/06/22 2012  TSH 2.137    BNP Recent Labs  Lab 05/06/22 1532  BNP 255.5*    DDimer No results for input(s): "DDIMER" in the last 168 hours.   Radiology    ECHOCARDIOGRAM COMPLETE  Result Date: 05/07/2022    ECHOCARDIOGRAM REPORT   Patient Name:   [name] Date of Exam: 05/07/2022 Medical Rec #:  [id number]      Height:       73.0 in Accession #:    [id number]     Weight:       212.3 lb Date of Birth:  Apr 04, 1946      BSA:          2.207 m Patient Age:    37 years       BP:           140/70 mmHg Patient Gender: M              HR:           78 bpm. Exam Location:  [facility] Procedure: 2D Echo, Color Doppler and Cardiac Doppler Indications:     CHF- acute diastolic O67.67   History:         Patient has prior history of Echocardiogram examinations, most                  recent 06/12/2021. COPD; Risk Factors:Hypertension. Severe                  aortic stenosis, TAVR.  Sonographer:     Sherrie Sport Referring Phys:  Athena Masse Diagnosing Phys: Ida Rogue MD  Sonographer Comments: Technically challenging study due to limited acoustic windows and no parasternal window. Image acquisition challenging due to COPD. IMPRESSIONS  1. Left ventricular ejection fraction, by estimation, is 55 to 60%. The left ventricle has normal function. The left ventricle has no regional wall motion abnormalities. Left ventricular diastolic parameters are consistent with Grade I diastolic dysfunction (impaired relaxation).  2. Right ventricular systolic function is normal. The right ventricular size is normal. There is normal pulmonary artery systolic pressure. The estimated right ventricular systolic pressure is 20.9 mmHg.  3. The mitral valve is normal in structure. No evidence of mitral valve regurgitation. No evidence of mitral stenosis.  4. The aortic valve has been repaired/replaced. s/p TAVR. Aortic valve regurgitation is not visualized. No aortic stenosis is present. Aortic valve area, by VTI measures 1.45 cm. Aortic valve mean gradient measures 5.2 mmHg. Aortic valve Vmax measures 1.56 m/s.  5. The inferior vena cava is normal in size with greater than 50% respiratory variability, suggesting right atrial pressure of 3 mmHg. FINDINGS  Left Ventricle: Left ventricular ejection fraction, by estimation, is 55 to 60%. The left ventricle has normal function. The left ventricle has no regional wall motion abnormalities. The left ventricular internal cavity  size was normal in size. There is  no left ventricular hypertrophy. Left ventricular diastolic parameters are consistent with Grade I diastolic dysfunction (impaired relaxation). Right Ventricle: The right ventricular size is normal. No increase in  right ventricular wall thickness. Right ventricular systolic function is normal. There is normal pulmonary artery systolic pressure. The tricuspid regurgitant velocity is 2.44 m/s, and  with an assumed right atrial pressure of 5 mmHg, the estimated right ventricular systolic pressure is 53.6 mmHg. Left Atrium: Left atrial size was normal in size. Right Atrium: Right atrial size was normal in size. Pericardium: There is no evidence of pericardial effusion. Mitral Valve: The mitral valve is normal in structure. No evidence of mitral valve regurgitation. No evidence of mitral valve stenosis. Tricuspid Valve: The tricuspid valve is normal in structure. Tricuspid valve regurgitation is mild . No evidence of tricuspid stenosis. Aortic Valve: The aortic valve has been repaired/replaced. Aortic valve regurgitation is not visualized. No aortic stenosis is present. Aortic valve mean gradient measures 5.2 mmHg. Aortic valve peak gradient measures 9.7 mmHg. Aortic valve area, by VTI measures 1.45 cm. Pulmonic Valve: The pulmonic valve was normal in structure. Pulmonic valve regurgitation is not visualized. No evidence of pulmonic stenosis. Aorta: The aortic root is normal in size and structure. Venous: The inferior vena cava is normal in size with greater than 50% respiratory variability, suggesting right atrial pressure of 3 mmHg. IAS/Shunts: No atrial level shunt detected by color flow Doppler.  LEFT VENTRICLE PLAX 2D LVIDd:         3.60 cm   Diastology LVIDs:         2.60 cm   LV e' medial:    5.66 cm/s LV PW:         1.10 cm   LV E/e' medial:  13.1 LV IVS:        1.40 cm   LV e' lateral:   6.53 cm/s LVOT diam:     2.00 cm   LV E/e' lateral: 11.3 LV SV:         41 LV SV Index:   19 LVOT Area:     3.14 cm  RIGHT VENTRICLE RV Basal diam:  3.20 cm RV Mid diam:    3.00 cm RV S prime:     12.30 cm/s TAPSE (M-mode): 2.2 cm LEFT ATRIUM             Index        RIGHT ATRIUM           Index LA diam:        3.40 cm 1.54 cm/m   RA  Area:     11.80 cm LA Vol (A2C):   48.8 ml 22.11 ml/m  RA Volume:   22.40 ml  10.15 ml/m LA Vol (A4C):   58.8 ml 26.64 ml/m LA Biplane Vol: 53.5 ml 24.24 ml/m  AORTIC VALVE AV Area (Vmax):    1.34 cm AV Area (Vmean):   1.34 cm AV Area (VTI):     1.45 cm AV Vmax:           155.80 cm/s AV Vmean:          102.300 cm/s AV VTI:            0.286 m AV Peak Grad:      9.7 mmHg AV Mean Grad:      5.2 mmHg LVOT Vmax:         66.60 cm/s LVOT Vmean:  43.500 cm/s LVOT VTI:          0.132 m LVOT/AV VTI ratio: 0.46 MITRAL VALVE                TRICUSPID VALVE MV Area (PHT): 2.52 cm     TR Peak grad:   23.8 mmHg MV Decel Time: 301 msec     TR Vmax:        244.00 cm/s MV E velocity: 73.90 cm/s MV A velocity: 107.00 cm/s  SHUNTS MV E/A ratio:  0.69         Systemic VTI:  0.13 m                             Systemic Diam: 2.00 cm Ida Rogue MD Electronically signed by Ida Rogue MD Signature Date/Time: 05/07/2022/1:19:39 PM    Final    CT Angio Chest PE W/Cm &/Or Wo Cm  Result Date: 05/06/2022 CLINICAL DATA:  Shortness of breath, weakness. Pulmonary embolism (PE) suspected, high prob EXAM: CT ANGIOGRAPHY CHEST WITH CONTRAST TECHNIQUE: Multidetector CT imaging of the chest was performed using the standard protocol during bolus administration of intravenous contrast. Multiplanar CT image reconstructions and MIPs were obtained to evaluate the vascular anatomy. RADIATION DOSE REDUCTION: This exam was performed according to the departmental dose-optimization program which includes automated exposure control, adjustment of the mA and/or kV according to patient size and/or use of iterative reconstruction technique. CONTRAST:  77m OMNIPAQUE IOHEXOL 350 MG/ML SOLN COMPARISON:  11/15/2019 FINDINGS: Cardiovascular: No filling defects in the pulmonary arteries to suggest pulmonary emboli. Heart is normal size. Aorta is normal caliber. Coronary artery and aortic calcifications. Aortic valve repair. Mediastinum/Nodes: No  mediastinal, hilar, or axillary adenopathy. Trachea and esophagus are unremarkable. Thyroid unremarkable. Lungs/Pleura: Mild elevation of the left hemidiaphragm. No confluent airspace opacities or effusions. Upper Abdomen: Imaging into the upper abdomen demonstrates no acute findings. Musculoskeletal: Chest wall soft tissues are unremarkable. No acute bony abnormality. Review of the MIP images confirms the above findings. IMPRESSION: No evidence of pulmonary embolus. No acute cardiopulmonary disease. Coronary artery disease. Aortic Atherosclerosis (ICD10-I70.0). Electronically Signed   By: KRolm BaptiseM.D.   On: 05/06/2022 18:47   DG Chest 2 View  Result Date: 05/06/2022 CLINICAL DATA:  Suspected sepsis EXAM: CHEST - 2 VIEW COMPARISON:  CT chest 11/15/2019 FINDINGS: Postprocedural changes from prior aortic valve replacement. No pleural effusion. No pneumothorax. Asymmetric elevation of the left hemidiaphragm, unchanged compared to prior exam. No focal airspace opacity. Unchanged cardiac and mediastinal contours. There is evidence of pulmonary venous congestion without evidence of overt pulmonary edema. Gas distended stomach. No acute osseous abnormality. IMPRESSION: No active cardiopulmonary disease. Electronically Signed   By: HMarin RobertsM.D.   On: 05/06/2022 16:16    Cardiac Studies   Echo Left ventricular ejection fraction, by estimation, is 55 to 60%. The  left ventricle has normal function. The left ventricle has no regional  wall motion abnormalities. Left ventricular diastolic parameters are  consistent with Grade I diastolic  dysfunction (impaired relaxation).   2. Right ventricular systolic function is normal. The right ventricular  size is normal. There is normal pulmonary artery systolic pressure. The  estimated right ventricular systolic pressure is 237.8mmHg.   3. The mitral valve is normal in structure. No evidence of mitral valve  regurgitation. No evidence of mitral stenosis.    4. The aortic valve has been repaired/replaced. s/p TAVR. Aortic valve  regurgitation  is not visualized. No aortic stenosis is present. Aortic  valve area, by VTI measures 1.45 cm. Aortic valve mean gradient measures  5.2 mmHg. Aortic valve Vmax measures  1.56 m/s.   5. The inferior vena cava is normal in size with greater than 50%  respiratory variability, suggesting right atrial pressure of 3 mmHg.    Patient Profile     Mr. Ishan Sanroman is a 76 year old gentleman with past medical history of smoking, hypertension, chronic diastolic CHF, severe aortic valve stenosis, status post TAVR June 2020 presenting with increasing shortness of breath over the past several days  Assessment & Plan    Acute respiratory failure with hypoxia Remote smoking history Hypoxic at home per EMS, increasing shortness of breath, worsening lower extremity edema for the past 2 weeks Requiring 3 to 6 L on ambulation with PT yesterday Noted to have hypoxia with saturations to low 80s on exertion with mobility specialist today -Has been missing doses of Lasix at home over the past several weeks Stable renal function on Lasix IV twice daily, would continue 1 more day -Has been started on steroids by pulmonary   Acute on chronic diastolic CHF Echo showing normal LV function, no significant valvular disease, stable TAVR gradient -Grossly does not appear significantly fluid overloaded, leg edema improving, no abdominal distention, no elevated RVSP on echo -would continue Lasix IV 1 more day with close monitoring of renal function  Essential hypertension Elevated blood pressure on arrival, this has improved with diuresis and start of losartan with metoprolol  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 contributing to CHF symptoms -Recommend we continue losartan 50 daily, metoprolol succinate 25 daily For low blood pressure could decrease losartan down to 25 mg   Aortic valve stenosis, status post  TAVR well-functioning aortic valve, acceptable gradient   Hyponatremia Chronically low, likely exacerbated by diuretics  Long discussion with wife at the bedside, numerous questions answered  Total encounter time more than 50 minutes  Greater than 50% was spent in counseling and coordination of care with the patient   For questions or updates, please contact [facility] Please consult [email or web address] for contact info under        Signed, Ida Rogue, MD  05/08/2022, 10:35 AM

## 2022-05-08 NOTE — Progress Notes (Signed)
PROGRESS NOTE  ANTERIO Kenneth Larsen  SWN:[id number] DOB: 1946-03-13 DOA: 05/06/2022 PCP: [facility]   Brief Narrative:  Patient is a 76 year old male with history of hypertension, hypothyroidism, previous smoker, COPD, diastolic CHF, aortic stenosis status post TAVR who presented with 3-day history of shortness of breath, weakness, mild lower extremity edema.  On presentation, he was hypertensive, tachypneic, requiring 5 L of oxygen.  COVID/flu screen test negative.  CT chest negative for PE without any acute pulmonary findings.  Patient was admitted for the management of acute and chronic diastolic CHF exacerbation along with COPD.  Started on IV lasix.  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  Requiring 5 to 6 L on presentation for maintenance of saturation.  Secondary to CHF exacerbation,possible  concurrent COPD exacerbation.  CT angio negative for PE.  Patient uses nighttime oxygen on him.  VBG had  showed PCO2 of 69. Continue diuresis, steroids. He might qualify for home oxygen, will check near his discharge date.  We will try to wean the oxygen, currently on intermittent 2 L. Family  suspect that he may have underlying sleep apnea.He needs f/u  with pulmonology as an outpatient   Acute on chronic diastolic CHF: Last echo done in October 2022 showed EF of 60-65%, no regional wall motion abnormality, grade 1 diastolic dysfunction.   New echo almost same with EF of 55 to 60%..  Continue IV Lasix.  Cardiology following. Has bilateral lower extremity edema .  Bibasilar crackles on auscultation.   Hyponatremia: Likely from hypervolemic status from CHF.  Continue fluid restriction, diuretic therapy   COPD with exacerbation: Continue bronchodilators as needed.  Not wheezing at the time ,patient is ex-smoker. As per the request of her son who is an Therapist, sports, we  requested for pulmonology consultation.  Started on steroids   Severe aortic stenosis: Status post TAVR   Hypertension: Monitor blood pressure.  Continue diuretic therapy   Hypothyroidism: Continue levothyroxine  Constipation: Continue bowel regimen  Debility/deconditioning: Patient seen by PT/OT and recommended home health on discharge      Nutrition Problem: Increased nutrient needs Etiology: chronic illness (CHF)    DVT prophylaxis:enoxaparin (LOVENOX) injection 40 mg Start: 05/06/22 2030     Code Status: DNR  Family Communication: Family at bedside  Patient status: Inpatient  Patient is from :Home  Anticipated discharge XF:GHWE  Estimated DC date:1-2 days   Consultants: Cardiology, pulmonology  Procedures: None  Antimicrobials:  Anti-infectives (From admission, onward)    None       Subjective: Patient seen and examined at the bedside today.  Feels better.  Sitting on the chair.  Speaking in full sentences.  Does not look in respiratory distress.  Requiring intermittent oxygen.  He had an episode of shortness of breath at night.  Lack of bowel movement.  Still has lower extremity edema  Objective: Vitals:   05/08/22 0456 05/08/22 0600 05/08/22 0804 05/08/22 1158  BP: 131/61  (!) 114/55 125/64  Pulse: 71 78 73 79  Resp: '19  17 17  '$ Temp: 97.7 F (36.5 C)  98.4 F (36.9 C) 98.6 F (37 C)  TempSrc: Oral     SpO2: 100% 92% 98% 96%  Weight:      Height:        Intake/Output Summary (Last 24 hours) at 05/08/2022 1213 Last data filed  at 05/08/2022 1100 Gross per 24 hour  Intake 1440 ml  Output 1200 ml  Net 240 ml   Filed Weights   05/06/22 1530 05/07/22 0500 05/08/22 0435  Weight: 99.8 kg 96.3 kg 97.6 kg    Examination:  General exam: Overall comfortable, not in distress, pleasant elderly male HEENT: PERRL Respiratory system: Bibasilar crackles Cardiovascular system: S1 & S2 heard, RRR.  Gastrointestinal system: Abdomen is nondistended, soft and nontender. Central nervous system: Alert and oriented Extremities: 1-2+ bilateral pitting pedal edema, no clubbing ,no cyanosis Skin: No rashes, no ulcers,no icterus     Data Reviewed: I have personally reviewed following labs and imaging studies  CBC: Recent Labs  Lab 05/06/22 1532 05/06/22 2059  WBC 7.1 6.6  NEUTROABS 5.8  --   HGB 15.2 14.3  HCT 47.2 44.7  MCV 93.8 94.5  PLT 258 403   Basic Metabolic Panel: Recent Labs  Lab 05/06/22 1532 05/06/22 2012 05/07/22 0521 05/08/22 0528  NA 129*  --  132* 133*  K 3.8  --  3.7 4.4  CL 90*  --  88* 85*  CO2 32  --  36* 41*  GLUCOSE 131*  --  100* 118*  BUN 12  --  9 19  CREATININE 0.80 0.72 0.78 1.00  CALCIUM 8.4*  --  8.3* 9.2     Recent Results (from the past 240 hour(s))  Culture, blood (Routine x 2)     Status: None (Preliminary result)   Collection Time: 05/06/22  3:32 PM   Specimen: BLOOD  Result Value Ref Range Status   Specimen Description BLOOD BLOOD RIGHT WRIST  Final   Special Requests   Final    BOTTLES DRAWN AEROBIC AND ANAEROBIC Blood Culture adequate volume   Culture   Final    NO GROWTH 2 DAYS Performed at [facility], [address] [place], [place] [address]    Report Status PENDING  Incomplete  Culture, blood (Routine x 2)     Status: None (Preliminary result)   Collection Time: 05/06/22  4:26 PM   Specimen: BLOOD  Result Value Ref Range Status   Specimen Description BLOOD BLOOD LEFT ARM  Final   Special Requests   Final    BOTTLES DRAWN AEROBIC AND ANAEROBIC  Blood Culture results may not be optimal due to an inadequate volume of blood received in culture bottles   Culture   Final    NO GROWTH 2 DAYS Performed at [facility], [address], [place], [place] [address]    Report Status PENDING  Incomplete  Resp Panel by RT-PCR (Flu A&B, Covid) Kenneth Nasal Swab     Status: None   Collection Time: 05/06/22  6:45 PM   Specimen: Kenneth Nasal Swab  Result Value Ref Range Status   SARS Coronavirus 2 by RT PCR NEGATIVE NEGATIVE Final    Comment: (NOTE) SARS-CoV-2 target nucleic acids are NOT DETECTED.  The SARS-CoV-2 RNA is generally detectable in upper respiratory specimens during the acute phase of infection. The lowest concentration of SARS-CoV-2 viral copies this assay can detect is 138 copies/mL. A negative result does not preclude SARS-Cov-2 infection  and should not be used as the sole basis for treatment or other patient management decisions. A negative result may occur with  improper specimen collection/handling, submission of specimen other than nasopharyngeal swab, presence of viral mutation(s) within the areas targeted by this assay, and inadequate number of viral copies(<138 copies/mL). A negative result must be combined with clinical observations, patient history, and epidemiological information. The expected result is Negative.  Fact Sheet for Patients:  EntrepreneurPulse.com.au  Fact Sheet for Healthcare Providers:  IncredibleEmployment.be  This test is no t yet approved or cleared by the [place] FDA and  has been authorized for detection and/or diagnosis of SARS-CoV-2 by FDA under an Emergency Use Authorization (EUA). This EUA will remain  in effect (meaning this test can be used) for the duration of the COVID-19 declaration under Section 564(b)(1) of the Act, 21 U.S.C.section 360bbb-3(b)(1), unless the authorization is terminated  or revoked sooner.        Influenza A by PCR NEGATIVE NEGATIVE Final   Influenza B by PCR NEGATIVE NEGATIVE Final    Comment: (NOTE) The Xpert Xpress SARS-CoV-2/FLU/RSV plus assay is intended as an aid in the diagnosis of influenza from Nasopharyngeal swab specimens and should not be used as a sole basis for treatment. Nasal washings and aspirates are unacceptable for Xpert Xpress SARS-CoV-2/FLU/RSV testing.  Fact Sheet for Patients: EntrepreneurPulse.com.au  Fact Sheet for Healthcare Providers: IncredibleEmployment.be  This test is not yet approved or cleared by the [place] FDA and has been authorized for detection and/or diagnosis of SARS-CoV-2 by FDA under an Emergency Use Authorization (EUA). This EUA will remain in effect (meaning this test can be used) for the duration of the COVID-19 declaration under Section 564(b)(1) of the Act, 21 U.S.C. section 360bbb-3(b)(1), unless the authorization is terminated or revoked.  Performed at [facility], [address] [place], [place] [address]      Radiology Studies: ECHOCARDIOGRAM COMPLETE  Result Date: 05/07/2022    ECHOCARDIOGRAM REPORT   Patient Name:   Kenneth Kenneth Larsen Date of Exam: 05/07/2022 Medical Rec #:  [id number]      Height:       73.0 in Accession #:    [id number]     Weight:       212.3 lb Date of Birth:  1946-04-07      BSA:          2.207 m Patient Age:    36 years       BP:           140/70 mmHg Patient Gender: M              HR:           78 bpm. Exam Location:  [facility] Procedure: 2D Echo, Color Doppler and Cardiac Doppler Indications:     CHF- acute diastolic Z32.99  History:         Patient has prior history of Echocardiogram examinations, most                  recent 06/12/2021. COPD; Risk Factors:Hypertension. Severe                  aortic stenosis, TAVR.  Sonographer:     Sherrie Sport Referring Phys:  Athena Masse Diagnosing Phys: Ida Rogue MD  Sonographer Comments: Technically challenging  study due to limited acoustic windows and no parasternal window. Image acquisition challenging due to COPD. IMPRESSIONS  1. Left ventricular ejection fraction, by estimation, is  55 to 60%. The left ventricle has normal function. The left ventricle has no regional wall motion abnormalities. Left ventricular diastolic parameters are consistent with Grade I diastolic dysfunction (impaired relaxation).  2. Right ventricular systolic function is normal. The right ventricular size is normal. There is normal pulmonary artery systolic pressure. The estimated right ventricular systolic pressure is 41.3 mmHg.  3. The mitral valve is normal in structure. No evidence of mitral valve regurgitation. No evidence of mitral stenosis.  4. The aortic valve has been repaired/replaced. s/p TAVR. Aortic valve regurgitation is not visualized. No aortic stenosis is present. Aortic valve area, by VTI measures 1.45 cm. Aortic valve mean gradient measures 5.2 mmHg. Aortic valve Vmax measures 1.56 m/s.  5. The inferior vena cava is normal in size with greater than 50% respiratory variability, suggesting right atrial pressure of 3 mmHg. FINDINGS  Left Ventricle: Left ventricular ejection fraction, by estimation, is 55 to 60%. The left ventricle has normal function. The left ventricle has no regional wall motion abnormalities. The left ventricular internal cavity size was normal in size. There is  no left ventricular hypertrophy. Left ventricular diastolic parameters are consistent with Grade I diastolic dysfunction (impaired relaxation). Right Ventricle: The right ventricular size is normal. No increase in right ventricular wall thickness. Right ventricular systolic function is normal. There is normal pulmonary artery systolic pressure. The tricuspid regurgitant velocity is 2.44 m/s, and  with an assumed right atrial pressure of 5 mmHg, the estimated right ventricular systolic pressure is 24.4 mmHg. Left Atrium: Left atrial size was normal in  size. Right Atrium: Right atrial size was normal in size. Pericardium: There is no evidence of pericardial effusion. Mitral Valve: The mitral valve is normal in structure. No evidence of mitral valve regurgitation. No evidence of mitral valve stenosis. Tricuspid Valve: The tricuspid valve is normal in structure. Tricuspid valve regurgitation is mild . No evidence of tricuspid stenosis. Aortic Valve: The aortic valve has been repaired/replaced. Aortic valve regurgitation is not visualized. No aortic stenosis is present. Aortic valve mean gradient measures 5.2 mmHg. Aortic valve peak gradient measures 9.7 mmHg. Aortic valve area, by VTI measures 1.45 cm. Pulmonic Valve: The pulmonic valve was normal in structure. Pulmonic valve regurgitation is not visualized. No evidence of pulmonic stenosis. Aorta: The aortic root is normal in size and structure. Venous: The inferior vena cava is normal in size with greater than 50% respiratory variability, suggesting right atrial pressure of 3 mmHg. IAS/Shunts: No atrial level shunt detected by color flow Doppler.  LEFT VENTRICLE PLAX 2D LVIDd:         3.60 cm   Diastology LVIDs:         2.60 cm   LV e' medial:    5.66 cm/s LV PW:         1.10 cm   LV E/e' medial:  13.1 LV IVS:        1.40 cm   LV e' lateral:   6.53 cm/s LVOT diam:     2.00 cm   LV E/e' lateral: 11.3 LV SV:         41 LV SV Index:   19 LVOT Area:     3.14 cm  RIGHT VENTRICLE RV Basal diam:  3.20 cm RV Mid diam:    3.00 cm RV S prime:     12.30 cm/s TAPSE (M-mode): 2.2 cm LEFT ATRIUM             Index  RIGHT ATRIUM           Index LA diam:        3.40 cm 1.54 cm/m   RA Area:     11.80 cm LA Vol (A2C):   48.8 ml 22.11 ml/m  RA Volume:   22.40 ml  10.15 ml/m LA Vol (A4C):   58.8 ml 26.64 ml/m LA Biplane Vol: 53.5 ml 24.24 ml/m  AORTIC VALVE AV Area (Vmax):    1.34 cm AV Area (Vmean):   1.34 cm AV Area (VTI):     1.45 cm AV Vmax:           155.80 cm/s AV Vmean:          102.300 cm/s AV VTI:             0.286 m AV Peak Grad:      9.7 mmHg AV Mean Grad:      5.2 mmHg LVOT Vmax:         66.60 cm/s LVOT Vmean:        43.500 cm/s LVOT VTI:          0.132 m LVOT/AV VTI ratio: 0.46 MITRAL VALVE                TRICUSPID VALVE MV Area (PHT): 2.52 cm     TR Peak grad:   23.8 mmHg MV Decel Time: 301 msec     TR Vmax:        244.00 cm/s MV E velocity: 73.90 cm/s MV A velocity: 107.00 cm/s  SHUNTS MV E/A ratio:  0.69         Systemic VTI:  0.13 m                             Systemic Diam: 2.00 cm Ida Rogue MD Electronically signed by Ida Rogue MD Signature Date/Time: 05/07/2022/1:19:39 PM    Final    CT Angio Chest PE W/Cm &/Or Wo Cm  Result Date: 05/06/2022 CLINICAL DATA:  Shortness of breath, weakness. Pulmonary embolism (PE) suspected, high prob EXAM: CT ANGIOGRAPHY CHEST WITH CONTRAST TECHNIQUE: Multidetector CT imaging of the chest was performed using the standard protocol during bolus administration of intravenous contrast. Multiplanar CT image reconstructions and MIPs were obtained to evaluate the vascular anatomy. RADIATION DOSE REDUCTION: This exam was performed according to the departmental dose-optimization program which includes automated exposure control, adjustment of the mA and/or kV according to patient size and/or use of iterative reconstruction technique. CONTRAST:  57m OMNIPAQUE IOHEXOL 350 MG/ML SOLN COMPARISON:  11/15/2019 FINDINGS: Cardiovascular: No filling defects in the pulmonary arteries to suggest pulmonary emboli. Heart is normal size. Aorta is normal caliber. Coronary artery and aortic calcifications. Aortic valve repair. Mediastinum/Nodes: No mediastinal, hilar, or axillary adenopathy. Trachea and esophagus are unremarkable. Thyroid unremarkable. Lungs/Pleura: Mild elevation of the left hemidiaphragm. No confluent airspace opacities or effusions. Upper Abdomen: Imaging into the upper abdomen demonstrates no acute findings. Musculoskeletal: Chest wall soft tissues are unremarkable.  No acute bony abnormality. Review of the MIP images confirms the above findings. IMPRESSION: No evidence of pulmonary embolus. No acute cardiopulmonary disease. Coronary artery disease. Aortic Atherosclerosis (ICD10-I70.0). Electronically Signed   By: KRolm BaptiseM.D.   On: 05/06/2022 18:47   DG Chest 2 View  Result Date: 05/06/2022 CLINICAL DATA:  Suspected sepsis EXAM: CHEST - 2 VIEW COMPARISON:  CT chest 11/15/2019 FINDINGS: Postprocedural changes from prior aortic valve replacement. No pleural effusion.  No pneumothorax. Asymmetric elevation of the left hemidiaphragm, unchanged compared to prior exam. No focal airspace opacity. Unchanged cardiac and mediastinal contours. There is evidence of pulmonary venous congestion without evidence of overt pulmonary edema. Gas distended stomach. No acute osseous abnormality. IMPRESSION: No active cardiopulmonary disease. Electronically Signed   By: Marin Roberts M.D.   On: 05/06/2022 16:16    Scheduled Meds:  aspirin EC  81 mg Oral Daily   docusate sodium  100 mg Oral BID   enoxaparin (LOVENOX) injection  40 mg Subcutaneous Q24H   feeding supplement  237 mL Oral BID BM   furosemide  40 mg Intravenous Q12H   guaiFENesin  600 mg Oral BID   levothyroxine  50 mcg Oral QAC breakfast   losartan  50 mg Oral Daily   metoprolol succinate  25 mg Oral Daily   multivitamin with minerals  1 tablet Oral Daily   polyethylene glycol  17 g Oral Daily   predniSONE  50 mg Oral Q breakfast   senna-docusate  1 tablet Oral BID   Continuous Infusions:   LOS: 1 day   Shelly Coss, MD [facility] P9/15/2023, 12:13 PM

## 2022-05-09 ENCOUNTER — Inpatient Hospital Stay: Payer: Medicare Other

## 2022-05-09 DIAGNOSIS — J9602 Acute respiratory failure with hypercapnia: Secondary | ICD-10-CM | POA: Diagnosis not present

## 2022-05-09 DIAGNOSIS — J9601 Acute respiratory failure with hypoxia: Secondary | ICD-10-CM | POA: Diagnosis not present

## 2022-05-09 DIAGNOSIS — I5033 Acute on chronic diastolic (congestive) heart failure: Secondary | ICD-10-CM | POA: Diagnosis not present

## 2022-05-09 DIAGNOSIS — I10 Essential (primary) hypertension: Secondary | ICD-10-CM | POA: Diagnosis not present

## 2022-05-09 DIAGNOSIS — R778 Other specified abnormalities of plasma proteins: Secondary | ICD-10-CM | POA: Diagnosis not present

## 2022-05-09 LAB — BLOOD GAS, ARTERIAL
Acid-Base Excess: 17.2 mmol/L — ABNORMAL HIGH (ref 0.0–2.0)
Acid-Base Excess: 18.9 mmol/L — ABNORMAL HIGH (ref 0.0–2.0)
Bicarbonate: 48.7 mmol/L — ABNORMAL HIGH (ref 20.0–28.0)
Bicarbonate: 48.4 mmol/L — ABNORMAL HIGH (ref 20.0–28.0)
Delivery systems: POSITIVE
Expiratory PAP: 6 cmH2O
FIO2: 40 %
Inspiratory PAP: 16 cmH2O
O2 Content: 2 L/min
O2 Saturation: 91.6 %
O2 Saturation: 97 %
Patient temperature: 37
Patient temperature: 37
pCO2 arterial: 99 mmHg (ref 32–48)
pCO2 arterial: 80 mmHg (ref 32–48)
pH, Arterial: 7.3 — ABNORMAL LOW (ref 7.35–7.45)
pH, Arterial: 7.39 (ref 7.35–7.45)
pO2, Arterial: 62 mmHg — ABNORMAL LOW (ref 83–108)
pO2, Arterial: 81 mmHg — ABNORMAL LOW (ref 83–108)

## 2022-05-09 LAB — BASIC METABOLIC PANEL
Anion gap: 10 (ref 5–15)
BUN: 31 mg/dL — ABNORMAL HIGH (ref 8–23)
CO2: 41 mmol/L — ABNORMAL HIGH (ref 22–32)
Calcium: 9.1 mg/dL (ref 8.9–10.3)
Chloride: 86 mmol/L — ABNORMAL LOW (ref 98–111)
Creatinine, Ser: 0.96 mg/dL (ref 0.61–1.24)
GFR, Estimated: >60 mL/min (ref >60)
Glucose, Bld: 115 mg/dL — ABNORMAL HIGH (ref 70–99)
Potassium: 4.8 mmol/L (ref 3.5–5.1)
Sodium: 137 mmol/L (ref 135–145)

## 2022-05-09 LAB — BRAIN NATRIURETIC PEPTIDE: B Natriuretic Peptide: 55.2 pg/mL (ref 0.0–100.0)

## 2022-05-09 MED ORDER — PREDNISONE 20 MG PO TABS
40.0000 mg | ORAL_TABLET | Freq: Every day | ORAL | Status: DC
  Administered 2022-05-10 – 2022-05-11 (×2): 40 mg via ORAL
  Filled 2022-05-09 (×2): qty 2

## 2022-05-09 MED ORDER — ORAL CARE MOUTH RINSE: 15.0000 mL | OROMUCOSAL | Status: DC | PRN

## 2022-05-09 MED ORDER — FUROSEMIDE 20 MG PO TABS: 20.0000 mg | ORAL_TABLET | Freq: Every day | ORAL | Status: DC

## 2022-05-09 NOTE — Progress Notes (Signed)
PROGRESS NOTE    Kenneth Larsen  IRC:[id number] DOB: [birth year] DOA: 05/06/2022 PCP: [facility] [place]   Brief Narrative:  Patient is a 76 year old male with history of hypertension, hypothyroidism, previous smoker, COPD, diastolic CHF, aortic stenosis status post TAVR who presented with 3-day history of shortness of breath, weakness, mild lower extremity edema.  On presentation, he was hypertensive, tachypneic, requiring 5 L of oxygen.  COVID/flu screen test negative.  CT chest negative for PE without any acute pulmonary findings.  Patient was admitted for the management of acute and chronic diastolic CHF exacerbation along with COPD.  Started on IV lasix.  Cardiology, pulmonology following  Assessment & Plan:   Acute respiratory failure with hypoxia/hypercapnia:  - Secondary to CHF exacerbation,possible  concurrent COPD exacerbation.   -CT angio negative for PE.   -9/16: Patient appears to be confused.  ABG consistent of hypercapnic respiratory failure.  Patient placed on BiPAP.  Recommend repeat ABGs in 4 hours.  Lasix was stopped for the concern of worsening hypercarbia with contraction alkalosis.  Prednisone dose was reduced from 50 mg to 40 mg.  Appreciate PCCM help.    Acute on chronic diastolic CHF:  -echo shows EF of 55 to 60%. -Lasix is a stopped due to above reason.  Cardiology on board-appreciate help -Being PE: WNL.  Repeat chest x-ray 9/16: Negative for any acute findings. -Continue losartan and metoprolol.  Hyponatremia: Likely from hypervolemic status from CHF.  Resolved  COPD with exacerbation: Continue bronchodilators as needed.  -on prednisone-dose decreased from 50 mg to 40 mg due to confusion.  Patient is currently on BiPAP.  PCCM on board.   Severe aortic stenosis: Status post TAVR   Hypertension: Continue losartan, metoprolol.  Continue to monitor blood pressure.     Hypothyroidism: Continue levothyroxine   Constipation: Continue bowel regimen    Debility/deconditioning: Patient seen by PT/OT and recommended home health on discharge.  Family is concerned about stroke?  Negative neurological exam.   Patient is at increased risk for morbidity and mortality.  DVT prophylaxis: Lovenox Code Status: DNR Family Communication: Patient's wife, daughter and granddaughter at the bedside present at bedside.  Plan of care discussed with patient in length and he verbalized understanding and agreed with it. Disposition Plan: To be determined  Consultants:  Cardiology PCCM  Procedures:  None  Antimicrobials:  None  Status is: Inpatient    Subjective: Patient seen and examined.  Sitting comfortably on the bed and eating breakfast.  Family at the bedside.  Patient alert and oriented and communicating well and answers questions appropriately.  No acute events overnight.  Family is concerned about stroke?  Since he has unsteady gait.  RN notified that patient was confused.  Cardiology ordered ABGs that shows elevated PCO2.  Evaluated by PCCM-patient placed on BiPAP.  Objective: Vitals:   05/09/22 0908 05/09/22 0915 05/09/22 0917 05/09/22 1207  BP: (!) 124/51   (!) 123/50  Pulse: 84   86  Resp: 20   20  Temp: 98.1 F (36.7 C)   98.5 F (36.9 C)  TempSrc: Oral     SpO2: 96% (!) 82% (!) 89% 94%  Weight:      Height:        Intake/Output Summary (Last 24 hours) at 05/09/2022 1313 Last data filed at 05/09/2022 0905 Gross per 24 hour  Intake 840 ml  Output 950 ml  Net -110 ml   Filed Weights   05/07/22 0500 05/08/22 0435 05/09/22 0535  Weight:  96.3 kg 97.6 kg 97.4 kg    Examination:  General exam: Appears calm and comfortable, on room air, communicating well, eating breakfast, family at the bedside Respiratory system: Clear to auscultation. Respiratory effort normal. Cardiovascular system: S1 & S2 heard, RRR. No JVD, murmurs, rubs, gallops or clicks.  1+ bilateral pitting edema positive  gastrointestinal system: Abdomen is  nondistended, soft and nontender. No organomegaly or masses felt. Normal bowel sounds heard. Central nervous system: Alert and oriented. No focal neurological deficits. Extremities: Symmetric 5 x 5 power. Skin: No rashes, lesions or ulcers Psychiatry: Judgement and insight appear normal. Mood & affect appropriate.    Data Reviewed: I have personally reviewed following labs and imaging studies  CBC: Recent Labs  Lab 05/06/22 1532 05/06/22 2059  WBC 7.1 6.6  NEUTROABS 5.8  --   HGB 15.2 14.3  HCT 47.2 44.7  MCV 93.8 94.5  PLT 258 182   Basic Metabolic Panel: Recent Labs  Lab 05/06/22 1532 05/06/22 2012 05/07/22 0521 05/08/22 0528 05/09/22 0454  NA 129*  --  132* 133* 137  K 3.8  --  3.7 4.4 4.8  CL 90*  --  88* 85* 86*  CO2 32  --  36* 41* 41*  GLUCOSE 131*  --  100* 118* 115*  BUN 12  --  9 19 31*  CREATININE 0.80 0.72 0.78 1.00 0.96  CALCIUM 8.4*  --  8.3* 9.2 9.1   GFR: Estimated Creatinine Clearance: 79.2 mL/min (by C-G formula based on SCr of 0.96 mg/dL). Liver Function Tests: Recent Labs  Lab 05/06/22 1532  AST 15  ALT 10  ALKPHOS 59  BILITOT 1.2  PROT 6.4*  ALBUMIN 3.6   No results for input(s): "LIPASE", "AMYLASE" in the last 168 hours. No results for input(s): "AMMONIA" in the last 168 hours. Coagulation Profile: Recent Labs  Lab 05/06/22 1532  INR 1.1   Cardiac Enzymes: No results for input(s): "CKTOTAL", "CKMB", "CKMBINDEX", "TROPONINI" in the last 168 hours. BNP (last 3 results) No results for input(s): "PROBNP" in the last 8760 hours. HbA1C: No results for input(s): "HGBA1C" in the last 72 hours. CBG: No results for input(s): "GLUCAP" in the last 168 hours. Lipid Profile: No results for input(s): "CHOL", "HDL", "LDLCALC", "TRIG", "CHOLHDL", "LDLDIRECT" in the last 72 hours. Thyroid Function Tests: Recent Labs    05/06/22 2012  TSH 2.137   Anemia Panel: No results for input(s): "VITAMINB12", "FOLATE", "FERRITIN", "TIBC", "IRON",  "RETICCTPCT" in the last 72 hours. Sepsis Labs: Recent Labs  Lab 05/06/22 1532 05/06/22 1746  LATICACIDVEN 1.0 0.6    Recent Results (from the past 240 hour(s))  Culture, blood (Routine x 2)     Status: None (Preliminary result)   Collection Time: 05/06/22  3:32 PM   Specimen: BLOOD  Result Value Ref Range Status   Specimen Description BLOOD BLOOD RIGHT WRIST  Final   Special Requests   Final    BOTTLES DRAWN AEROBIC AND ANAEROBIC Blood Culture adequate volume   Culture   Final    NO GROWTH 3 DAYS Performed at [facility], [address], [place],  [address]    Report Status PENDING  Incomplete  Culture, blood (Routine x 2)     Status: None (Preliminary result)   Collection Time: 05/06/22  4:26 PM   Specimen: BLOOD  Result Value Ref Range Status   Specimen Description BLOOD BLOOD LEFT ARM  Final   Special Requests   Final    BOTTLES DRAWN AEROBIC  AND ANAEROBIC Blood Culture results may not be optimal due to an inadequate volume of blood received in culture bottles   Culture   Final    NO GROWTH 3 DAYS Performed at [facility], [address] [place], [place] [address]    Report Status PENDING  Incomplete  Resp Panel by RT-PCR (Flu A&B, Covid) Anterior Nasal Swab     Status: None   Collection Time: 05/06/22  6:45 PM   Specimen: Anterior Nasal Swab  Result Value Ref Range Status   SARS Coronavirus 2 by RT PCR NEGATIVE NEGATIVE Final    Comment: (NOTE) SARS-CoV-2 target nucleic acids are NOT DETECTED.  The SARS-CoV-2 RNA is generally detectable in upper respiratory specimens during the acute phase of infection. The lowest concentration of SARS-CoV-2 viral copies this assay can detect is 138 copies/mL. A negative result does not preclude SARS-Cov-2 infection and should not be used as the sole basis for treatment or other patient management decisions. A negative result may occur with  improper specimen collection/handling, submission of  specimen other than nasopharyngeal swab, presence of viral mutation(s) within the areas targeted by this assay, and inadequate number of viral copies(<138 copies/mL). A negative result must be combined with clinical observations, patient history, and epidemiological information. The expected result is Negative.  Fact Sheet for Patients:  EntrepreneurPulse.com.au  Fact Sheet for Healthcare Providers:  IncredibleEmployment.be  This test is no t yet approved or cleared by the [place] FDA and  has been authorized for detection and/or diagnosis of SARS-CoV-2 by FDA under an Emergency Use Authorization (EUA). This EUA will remain  in effect (meaning this test can be used) for the duration of the COVID-19 declaration under Section 564(b)(1) of the Act, 21 U.S.C.section 360bbb-3(b)(1), unless the authorization is terminated  or revoked sooner.       Influenza A by PCR NEGATIVE NEGATIVE Final   Influenza B by PCR NEGATIVE NEGATIVE Final    Comment: (NOTE) The Xpert Xpress SARS-CoV-2/FLU/RSV plus assay is intended as an aid in the diagnosis of influenza from Nasopharyngeal swab specimens and should not be used as a sole basis for treatment. Nasal washings and aspirates are unacceptable for Xpert Xpress SARS-CoV-2/FLU/RSV testing.  Fact Sheet for Patients: EntrepreneurPulse.com.au  Fact Sheet for Healthcare Providers: IncredibleEmployment.be  This test is not yet approved or cleared by the [place] FDA and has been authorized for detection and/or diagnosis of SARS-CoV-2 by FDA under an Emergency Use Authorization (EUA). This EUA will remain in effect (meaning this test can be used) for the duration of the COVID-19 declaration under Section 564(b)(1) of the Act, 21 U.S.C. section 360bbb-3(b)(1), unless the authorization is terminated or revoked.  Performed at [facility], [address], [place], [place] [address]       Radiology Studies: [facility] Chest [place] 1 View  Result Date: 05/09/2022 CLINICAL DATA:  Bronchitis. EXAM: PORTABLE CHEST 1 VIEW COMPARISON:  Chest radiograph dated May 06, 2022 FINDINGS: The heart size and mediastinal contours are within normal limits. Atherosclerotic calcification of the aortic arch. Trans aortic valve replacement. Elevation of the left hemidiaphragm with left basilar atelectasis. No focal consolidation or large pleural effusion. 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 IMPRESSION: No focal consolidation or large pleural effusion. Left basilar atelectasis with elevation of the left hemidiaphragm. Electronically Signed   By: Keane Police D.O.   On: 05/09/2022 09:46    Scheduled Meds:  aspirin EC  81 mg Oral Daily  docusate sodium  100 mg Oral BID   enoxaparin (LOVENOX) injection  40 mg Subcutaneous Q24H   feeding supplement  237 mL Oral BID BM   guaiFENesin  600 mg Oral BID   levothyroxine  50 mcg Oral QAC breakfast   losartan  50 mg Oral Daily   metoprolol succinate  25 mg Oral Daily   multivitamin with minerals  1 tablet Oral Daily   polyethylene glycol  17 g Oral Daily   predniSONE  50 mg Oral Q breakfast   senna-docusate  1 tablet Oral BID   Continuous Infusions:   LOS: 2 days   Time spent: 45 minutes   Lauraann Missey Loann Quill, MD [facility]  If 7PM-7AM, please contact night-coverage [email or web address] 05/09/2022, 1:13 PM

## 2022-05-09 NOTE — TOC Progression Note (Signed)
Transition of Care [facility]) - Progression Note    Patient Details  Name: Kenneth Larsen MRN: [id number] Date of Birth: 1946-04-23  Transition of Care [facility]) CM/SW Contact  Izola Price, RN Phone Number: 05/09/2022, 12:55 PM  Clinical Narrative: 9/16: Discharge today on hold to due patient being slow to answer and an ABG revealing hypercapnic respiratory failure and patient placed on BiBap--per provider notes. Not medically stable for discharge with ongoing medical issues notes as a barrier to discharge. Simmie Davies RN CM    Expected Discharge Plan: Home/Self Care Barriers to Discharge: Continued Medical Work up  Expected Discharge Plan and Services Expected Discharge Plan: Home/Self Care       Living arrangements for the past 2 months: Single Family Home                                       Social Determinants of Health (SDOH) Interventions    Readmission Risk Interventions     No data to display

## 2022-05-09 NOTE — Progress Notes (Addendum)
Rounding Note    Patient Name: Kenneth Larsen Date of Encounter: 05/09/2022  [facility] Cardiologist: Ida Rogue, MD   Subjective   UOP -1.3L. Kidney function stable. Patient remains on 3-4L O2. Seems he is intermittently lethargic and sleepy.   Inpatient Medications    Scheduled Meds:  aspirin EC  81 mg Oral Daily   docusate sodium  100 mg Oral BID   enoxaparin (LOVENOX) injection  40 mg Subcutaneous Q24H   feeding supplement  237 mL Oral BID BM   furosemide  40 mg Intravenous Q12H   guaiFENesin  600 mg Oral BID   levothyroxine  50 mcg Oral QAC breakfast   losartan  50 mg Oral Daily   metoprolol succinate  25 mg Oral Daily   multivitamin with minerals  1 tablet Oral Daily   polyethylene glycol  17 g Oral Daily   predniSONE  50 mg Oral Q breakfast   senna-docusate  1 tablet Oral BID   Continuous Infusions:  PRN Meds: acetaminophen **OR** acetaminophen, ipratropium-albuterol, ondansetron **OR** ondansetron (ZOFRAN) IV   Vital Signs    Vitals:   05/08/22 2116 05/08/22 2340 05/09/22 0414 05/09/22 0535  BP: (!) 114/56 137/68 (!) 134/58   Pulse: 78 78 88   Resp: '19 20 20   '$ Temp: 98 F (36.7 C) 97.8 F (36.6 C) 98.4 F (36.9 C)   TempSrc:  Oral Oral   SpO2: 98% 91% 95%   Weight:    97.4 kg  Height:        Intake/Output Summary (Last 24 hours) at 05/09/2022 0732 Last data filed at 05/09/2022 0600 Gross per 24 hour  Intake 1320 ml  Output 1300 ml  Net 20 ml      05/09/2022    5:35 AM 05/08/2022    4:35 AM 05/07/2022    5:00 AM  Last 3 Weights  Weight (lbs) 214 lb 11.7 oz 215 lb 2.7 oz 212 lb 4.9 oz  Weight (kg) 97.4 kg 97.6 kg 96.3 kg      Telemetry    NSR, HR 70s, Ventricular trigeminy, PVCs - Personally Reviewed  ECG    No new - Personally Reviewed  Physical Exam   GEN: No acute distress.   Neck: No JVD Cardiac: RRR, no murmurs, rubs, or gallops.  Respiratory: diminished bilaterally. GI: Soft, nontender, non-distended  MS: No  edema; No deformity. Neuro:  Nonfocal  Psych: Normal affect   Labs    High Sensitivity Troponin:   Recent Labs  Lab 05/06/22 1746 05/06/22 2013  TROPONINIHS 31* 30*     Chemistry Recent Labs  Lab 05/06/22 1532 05/06/22 2012 05/07/22 0521 05/08/22 0528 05/09/22 0454  NA 129*  --  132* 133* 137  K 3.8  --  3.7 4.4 4.8  CL 90*  --  88* 85* 86*  CO2 32  --  36* 41* 41*  GLUCOSE 131*  --  100* 118* 115*  BUN 12  --  9 19 31*  CREATININE 0.80   < > 0.78 1.00 0.96  CALCIUM 8.4*  --  8.3* 9.2 9.1  PROT 6.4*  --   --   --   --   ALBUMIN 3.6  --   --   --   --   AST 15  --   --   --   --   ALT 10  --   --   --   --   ALKPHOS 59  --   --   --   --  BILITOT 1.2  --   --   --   --   GFRNONAA >60   < > >60 >60 >60  ANIONGAP 7  --  '8 7 10   '$ < > = values in this interval not displayed.    Lipids No results for input(s): "CHOL", "TRIG", "HDL", "LABVLDL", "LDLCALC", "CHOLHDL" in the last 168 hours.  Hematology Recent Labs  Lab 05/06/22 1532 05/06/22 2059  WBC 7.1 6.6  RBC 5.03 4.73  HGB 15.2 14.3  HCT 47.2 44.7  MCV 93.8 94.5  MCH 30.2 30.2  MCHC 32.2 32.0  RDW 13.0 12.9  PLT 258 236   Thyroid  Recent Labs  Lab 05/06/22 2012  TSH 2.137    BNP Recent Labs  Lab 05/06/22 1532  BNP 255.5*    DDimer No results for input(s): "DDIMER" in the last 168 hours.   Radiology    ECHOCARDIOGRAM COMPLETE  Result Date: 05/07/2022    ECHOCARDIOGRAM REPORT   Patient Name:   Kenneth Larsen Date of Exam: 05/07/2022 Medical Rec #:  [id number]      Height:       73.0 in Accession #:    [id number]     Weight:       212.3 lb Date of Birth:  March 14, 1946      BSA:          2.207 m Patient Age:    76 years       BP:           140/70 mmHg Patient Gender: M              HR:           78 bpm. Exam Location:  [facility] Procedure: 2D Echo, Color Doppler and Cardiac Doppler Indications:     CHF- acute diastolic N00.37  History:         Patient has prior history of Echocardiogram examinations, most                   recent 06/12/2021. COPD; Risk Factors:Hypertension. Severe                  aortic stenosis, TAVR.  Sonographer:     Sherrie Sport Referring Phys:  Athena Masse Diagnosing Phys: Ida Rogue MD  Sonographer Comments: Technically challenging study due to limited acoustic windows and no parasternal window. Image acquisition challenging due to COPD. IMPRESSIONS  1. Left ventricular ejection fraction, by estimation, is 55 to 60%. The left ventricle has normal function. The left ventricle has no regional wall motion abnormalities. Left ventricular diastolic parameters are consistent with Grade I diastolic dysfunction (impaired relaxation).  2. Right ventricular systolic function is normal. The right ventricular size is normal. There is normal pulmonary artery systolic pressure. The estimated right ventricular systolic pressure is 04.8 mmHg.  3. The mitral valve is normal in structure. No evidence of mitral valve regurgitation. No evidence of mitral stenosis.  4. The aortic valve has been repaired/replaced. s/p TAVR. Aortic valve regurgitation is not visualized. No aortic stenosis is present. Aortic valve area, by VTI measures 1.45 cm. Aortic valve mean gradient measures 5.2 mmHg. Aortic valve Vmax measures 1.56 m/s.  5. The inferior vena cava is normal in size with greater than 50% respiratory variability, suggesting right atrial pressure of 3 mmHg. FINDINGS  Left Ventricle: Left ventricular ejection fraction, by estimation, is 55 to 60%. The left ventricle has normal function. The left ventricle has no regional wall  motion abnormalities. The left ventricular internal cavity size was normal in size. There is  no left ventricular hypertrophy. Left ventricular diastolic parameters are consistent with Grade I diastolic dysfunction (impaired relaxation). Right Ventricle: The right ventricular size is normal. No increase in right ventricular wall thickness. Right ventricular systolic function is normal. There is  normal pulmonary artery systolic pressure. The tricuspid regurgitant velocity is 2.44 m/s, and  with an assumed right atrial pressure of 5 mmHg, the estimated right ventricular systolic pressure is 14.9 mmHg. Left Atrium: Left atrial size was normal in size. Right Atrium: Right atrial size was normal in size. Pericardium: There is no evidence of pericardial effusion. Mitral Valve: The mitral valve is normal in structure. No evidence of mitral valve regurgitation. No evidence of mitral valve stenosis. Tricuspid Valve: The tricuspid valve is normal in structure. Tricuspid valve regurgitation is mild . No evidence of tricuspid stenosis. Aortic Valve: The aortic valve has been repaired/replaced. Aortic valve regurgitation is not visualized. No aortic stenosis is present. Aortic valve mean gradient measures 5.2 mmHg. Aortic valve peak gradient measures 9.7 mmHg. Aortic valve area, by VTI measures 1.45 cm. Pulmonic Valve: The pulmonic valve was normal in structure. Pulmonic valve regurgitation is not visualized. No evidence of pulmonic stenosis. Aorta: The aortic root is normal in size and structure. Venous: The inferior vena cava is normal in size with greater than 50% respiratory variability, suggesting right atrial pressure of 3 mmHg. IAS/Shunts: No atrial level shunt detected by color flow Doppler.  LEFT VENTRICLE PLAX 2D LVIDd:         3.60 cm   Diastology LVIDs:         2.60 cm   LV e' medial:    5.66 cm/s LV PW:         1.10 cm   LV E/e' medial:  13.1 LV IVS:        1.40 cm   LV e' lateral:   6.53 cm/s LVOT diam:     2.00 cm   LV E/e' lateral: 11.3 LV SV:         41 LV SV Index:   19 LVOT Area:     3.14 cm  RIGHT VENTRICLE RV Basal diam:  3.20 cm RV Mid diam:    3.00 cm RV S prime:     12.30 cm/s TAPSE (M-mode): 2.2 cm LEFT ATRIUM             Index        RIGHT ATRIUM           Index LA diam:        3.40 cm 1.54 cm/m   RA Area:     11.80 cm LA Vol (A2C):   48.8 ml 22.11 ml/m  RA Volume:   22.40 ml  10.15 ml/m  LA Vol (A4C):   58.8 ml 26.64 ml/m LA Biplane Vol: 53.5 ml 24.24 ml/m  AORTIC VALVE AV Area (Vmax):    1.34 cm AV Area (Vmean):   1.34 cm AV Area (VTI):     1.45 cm AV Vmax:           155.80 cm/s AV Vmean:          102.300 cm/s AV VTI:            0.286 m AV Peak Grad:      9.7 mmHg AV Mean Grad:      5.2 mmHg LVOT Vmax:         66.60  cm/s LVOT Vmean:        43.500 cm/s LVOT VTI:          0.132 m LVOT/AV VTI ratio: 0.46 MITRAL VALVE                TRICUSPID VALVE MV Area (PHT): 2.52 cm     TR Peak grad:   23.8 mmHg MV Decel Time: 301 msec     TR Vmax:        244.00 cm/s MV E velocity: 73.90 cm/s MV A velocity: 107.00 cm/s  SHUNTS MV E/A ratio:  0.69         Systemic VTI:  0.13 m                             Systemic Diam: 2.00 cm Ida Rogue MD Electronically signed by Ida Rogue MD Signature Date/Time: 05/07/2022/1:19:39 PM    Final     Cardiac Studies   Echo 04/2022  1. Left ventricular ejection fraction, by estimation, is 55 to 60%. The  left ventricle has normal function. The left ventricle has no regional  wall motion abnormalities. Left ventricular diastolic parameters are  consistent with Grade I diastolic  dysfunction (impaired relaxation).   2. Right ventricular systolic function is normal. The right ventricular  size is normal. There is normal pulmonary artery systolic pressure. The  estimated right ventricular systolic pressure is 86.5 mmHg.   3. The mitral valve is normal in structure. No evidence of mitral valve  regurgitation. No evidence of mitral stenosis.   4. The aortic valve has been repaired/replaced. s/p TAVR. Aortic valve  regurgitation is not visualized. No aortic stenosis is present. Aortic  valve area, by VTI measures 1.45 cm. Aortic valve mean gradient measures  5.2 mmHg. Aortic valve Vmax measures  1.56 m/s.   5. The inferior vena cava is normal in size with greater than 50%  respiratory variability, suggesting right atrial pressure of 3 mmHg.   Echo  05/2021 1. Left ventricular ejection fraction, by estimation, is 60 to 65%. The  left ventricle has normal function. The left ventricle has no regional  wall motion abnormalities. Left ventricular diastolic parameters are  consistent with Grade I diastolic  dysfunction (impaired relaxation).   2. Right ventricular systolic function is normal. The right ventricular  size is normal. Tricuspid regurgitation signal is inadequate for assessing  PA pressure.   3. Left atrial size was moderately dilated.   4. The mitral valve is normal in structure. No evidence of mitral valve  regurgitation. No evidence of mitral stenosis.   5. The aortic valve was not well visualized. Aortic valve regurgitation  is not visualized. No aortic stenosis is present. There is a 29 mm Sapien  prosthetic (TAVR) valve present in the aortic position. Procedure Date:  11/15/18. Aortic valve area, by VTI  measures 2.72 cm. Aortic valve mean gradient measures 7.5 mmHg. Aortic  valve Vmax measures 2.13 m/s.   6. The inferior vena cava is normal in size with greater than 50%  respiratory variability, suggesting right atrial pressure of 3 mmHg.   7. Challenging images   Comparison(s): Previous AV velocities reported as 23 peak PG, 12 mean PG.    Cardiac cath 09/2018 RIGHT/LEFT HEART CATH AND CORONARY ANGIOGRAPHY    Conclusion   Conclusions: Mild to moderate, non-obstructive coronary artery disease. Moderately elevated left heart, right heart, and pulmonary artery pressures. Normal Fick cardiac output/index.   Recommendations:  Medical therapy and risk factor modification to prevent progression of moderate coronary artery disease. Restart diuresis this afternoon; will initiate furosemide 40 mg IV BID. Outpatient TAVR clinic referral for further workup/management of severe aortic stenosis.   Nelva Bush, MD [facility] Pager: [phone]   Recommendations   Antiplatelet/Anticoag Recommend Aspirin '81mg'$   daily for moderate CAD.    Patient Profile     76 y.o. male with a hx of hypertension, COPD, tobacco history, chronic diastolic CHF, severe AS status post TAVR in 11/11/2018 who is being seen 05/07/2022 for the evaluation of heart failure  Assessment & Plan    Acute on chronic diastolic heart failure - BNP 255 - IV lasix '40mg'$  BID - PTA lasix '40mg'$  daily>he was missing lasix doses at home - Echo this admission showed normal LVSF, no significant valvular disease, stable TAVR gradient - UOP -2.1L - Kidney function stable - he is on 3L O2, not on O2 at baseline. Wean as able - volume status improving - continue Losartan '50mg'$  daily and Toprol XL '25mg'$  daily. Continue GDMT as able - transition to oral lasix today   Acute respiratory failure with hypoxia and hypercapnia - presented with worsening dyspnea requiring up to 6L, now down to 3-4L - CHF and COPD contributing to breathing issues. He uses nighttime O2  - he was missing lasix doses at home - CTA chest negative for PE - steroids per pulmonology - Pulmonology is following, he may need O2 at home.    Severe AS s/p TAVR in 2020 - repeat echo showed well functioning aortic valve   Elevated troponin - minimally elevated to the 30s in the setting of acute heart failure - suspect demand ischemia in the setting of acute heart failure and respiratory failure - No chest pain reported - Cath in 2020 showed mild nonobstructive CAD - continue ASA   Hyponatremia - in the setting of volume overload - improving with diuresis>now normal   COPD with mild exacerbation - treated with Duonebs in the ED - will need follow-up in the outpatient setting  For questions or updates, please contact [place] Please consult [email or web address] for contact info under        Signed, Arleny Kruger Ninfa Meeker, PA-C  05/09/2022, 7:32 AM

## 2022-05-09 NOTE — Progress Notes (Signed)
PULMONOLOGY         Date: 05/09/2022,   MRN# [id number] Kenneth Larsen 12-08-1945     AdmissionWeight: 99.8 kg                 CurrentWeight: 97.4 kg  Referring provider: Dr Tawanna Solo   CHIEF COMPLAINT:   Acute on chronic hypoxemic respiratory failure   HISTORY OF PRESENT ILLNESS   76 yo M with hx of essential hypertension, hypothyroidism, lilfelong smoker with COPD and chronic hypoxemia on O2 in the past, hx of CHF with TAVR and preserved ejection fraction came in with chest discomfort and SOB associated with malaise and LE edema.  He was noted to be tachyarrythmic and dyspneic found to have hypoxemia requiring 5L/min [place].  He had blood work done in ER with mild elevation of BNP >250 mild hyponatremia with hypervolemic fluid status, mild hypertension, normal VBG on 5L O2, essentially normal CBC and viral workup was negative for covid, and flu.  He had CT chest which I reviewed with findings of bronchitic changes diffusely and bilaterally to moderate severity with absence of substantial pulmonary edema, pleural effusions, consolidated infiltrate negative for PE, no areas of frank honeycombing or pneumothorax. He was able to speak in full sentences and endorses chest discomfort unlike his normal dyspnea. He denies constitutional symptoms. PCCM consultation for further evaluation and management.   05/08/22- patient feels improved, his wife shares he had episode of resp distress overnight but she thinks this was due to O2 line kinking since patient was unable to appreciate any air flow through nasal canula.  We discussed being careful when moving around to NOT sit on or bend cordage for oxygen therapy.  He is cleared from pulmonary standpoint and is able to finish prednisone taper for bronchitis.  Please reduce prednisone by '5mg'$  daily.   05/09/22- patient seems more slow to answer and encephalopathic during interview today.  He was noted to be confused by wife and she communicated this  with RN.  An abg was done with findings of hypercapnic respiratory failure.  I have discussed this with family and ordered bipap for now.  He may need to be monitored in PCU.  Im stopping his lasix for now due to worsening hypercarbia with contraction alkalosis. Reducing prednisone to '40mg'$  from 50.  Will need to hold DC today    PAST MEDICAL HISTORY   Past Medical History:  Diagnosis Date   Arthritis    Cancer ([place])    2010-melanoma   Chronic diastolic (congestive) heart failure (HCC)    COPD (chronic obstructive pulmonary disease) ([place])    Hypertension    Hypothyroidism    Severe aortic stenosis      SURGICAL HISTORY   Past Surgical History:  Procedure Laterality Date   CARDIAC CATHETERIZATION     RIGHT/LEFT HEART CATH AND CORONARY ANGIOGRAPHY N/A 10/13/2018   Procedure: RIGHT/LEFT HEART CATH AND CORONARY ANGIOGRAPHY;  Surgeon: Nelva Bush, MD;  Location: [facility];  Service: Cardiovascular;  Laterality: N/A;   TEE WITHOUT CARDIOVERSION N/A 11/15/2018   Procedure: TRANSESOPHAGEAL ECHOCARDIOGRAM (TEE);  Surgeon: Burnell Blanks, MD;  Location: [facility];  Service: Open Heart Surgery;  Laterality: N/A;   TRANSCATHETER AORTIC VALVE REPLACEMENT, TRANSFEMORAL  11/15/2018   TRANSCATHETER AORTIC VALVE REPLACEMENT, TRANSFEMORAL N/A 11/15/2018   Procedure: TRANSCATHETER AORTIC VALVE REPLACEMENT, TRANSFEMORAL;  Surgeon: Burnell Blanks, MD;  Location: [facility];  Service: Open Heart Surgery;  Laterality: N/A;  VASECTOMY       FAMILY HISTORY   Family History  Problem Relation Age of Onset   Cancer Father      SOCIAL HISTORY   Social History   Tobacco Use   Smoking status: Former    Packs/day: 1.00    Years: 25.00    Total pack years: 25.00    Types: Cigarettes   Smokeless tobacco: Never  Vaping Use   Vaping Use: Never used  Substance Use Topics   Alcohol use: Never   Drug use: Never     MEDICATIONS    Home  Medication:    Current Medication:  Current Facility-Administered Medications:    acetaminophen (TYLENOL) tablet 650 mg, 650 mg, Oral, Q6H PRN **OR** acetaminophen (TYLENOL) suppository 650 mg, 650 mg, Rectal, Q6H PRN, Athena Masse, MD   aspirin EC tablet 81 mg, 81 mg, Oral, Daily, Judd Gaudier V, MD, 81 mg at 05/09/22 0914   docusate sodium (COLACE) capsule 100 mg, 100 mg, Oral, BID, Sharion Settler, NP, 100 mg at 05/09/22 0914   enoxaparin (LOVENOX) injection 40 mg, 40 mg, Subcutaneous, Q24H, Athena Masse, MD, 40 mg at 05/08/22 2052   feeding supplement (ENSURE ENLIVE / ENSURE PLUS) liquid 237 mL, 237 mL, Oral, BID BM, Adhikari, Amrit, MD, 237 mL at 05/08/22 1045   furosemide (LASIX) tablet 20 mg, 20 mg, Oral, Daily, Furth, Cadence H, PA-C   guaiFENesin (MUCINEX) 12 hr tablet 600 mg, 600 mg, Oral, BID, Adhikari, Amrit, MD, 600 mg at 05/09/22 0913   ipratropium-albuterol (DUONEB) 0.5-2.5 (3) MG/3ML nebulizer solution 3 mL, 3 mL, Nebulization, Q4H PRN, Athena Masse, MD   levothyroxine (SYNTHROID) tablet 50 mcg, 50 mcg, Oral, QAC breakfast, Athena Masse, MD, 50 mcg at 05/09/22 1194   losartan (COZAAR) tablet 50 mg, 50 mg, Oral, Daily, Gollan, Kathlene November, MD, 50 mg at 05/09/22 0914   metoprolol succinate (TOPROL-XL) 24 hr tablet 25 mg, 25 mg, Oral, Daily, Gollan, Kathlene November, MD, 25 mg at 05/09/22 1740   multivitamin with minerals tablet 1 tablet, 1 tablet, Oral, Daily, Adhikari, Amrit, MD, 1 tablet at 05/09/22 0913   ondansetron (ZOFRAN) tablet 4 mg, 4 mg, Oral, Q6H PRN **OR** ondansetron (ZOFRAN) injection 4 mg, 4 mg, Intravenous, Q6H PRN, Athena Masse, MD   polyethylene glycol (MIRALAX / GLYCOLAX) packet 17 g, 17 g, Oral, Daily, Adhikari, Amrit, MD, 17 g at 05/09/22 0915   predniSONE (DELTASONE) tablet 50 mg, 50 mg, Oral, Q breakfast, Ottie Glazier, MD, 50 mg at 05/09/22 0914   senna-docusate (Senokot-S) tablet 1 tablet, 1 tablet, Oral, BID, Shelly Coss, MD, 1 tablet at  05/09/22 0914    ALLERGIES   Penicillins     REVIEW OF SYSTEMS    Review of Systems:  Gen:  Denies  fever, sweats, chills weigh loss  HEENT: Denies blurred vision, double vision, ear pain, eye pain, hearing loss, nose bleeds, sore throat Cardiac:  No dizziness, chest pain or heaviness, chest tightness,edema Resp:   reports dyspnea chronically  Gi: Denies swallowing difficulty, stomach pain, nausea or vomiting, diarrhea, constipation, bowel incontinence Gu:  Denies bladder incontinence, burning urine Ext:   Denies Joint pain, stiffness or swelling Skin: Denies  skin rash, easy bruising or bleeding or hives Endoc:  Denies polyuria, polydipsia , polyphagia or weight change Psych:   Denies depression, insomnia or hallucinations   Other:  All other systems negative   VS: BP (!) 124/51 (BP Location: Left Arm)   Pulse 84  Temp 98.1 F (36.7 C) (Oral)   Resp 20   Ht '6\' 1"'$  (1.854 m)   Wt 97.4 kg   SpO2 (!) 89%   BMI 28.33 kg/m      PHYSICAL EXAM    GENERAL:NAD, no fevers, chills, no weakness no fatigue HEAD: Normocephalic, atraumatic.  EYES: Pupils equal, round, reactive to light. Extraocular muscles intact. No scleral icterus.  MOUTH: Moist mucosal membrane. Dentition intact. No abscess noted.  EAR, NOSE, THROAT: Clear without exudates. No external lesions.  NECK: Supple. No thyromegaly. No nodules. No JVD.  PULMONARY: decreased breath sounds with mild rhonchi worse at bases bilaterally.  CARDIOVASCULAR: S1 and S2. Regular rate and rhythm. No murmurs, rubs, or gallops. No edema. Pedal pulses 2+ bilaterally.  GASTROINTESTINAL: Soft, nontender, nondistended. No masses. Positive bowel sounds. No hepatosplenomegaly.  MUSCULOSKELETAL: No swelling, clubbing, or edema. Range of motion full in all extremities.  NEUROLOGIC: Cranial nerves II through XII are intact. No gross focal neurological deficits. Sensation intact. Reflexes intact.  SKIN: No ulceration, lesions, rashes,  or cyanosis. Skin warm and dry. Turgor intact.  PSYCHIATRIC: Mood, affect within normal limits. The patient is awake, alert and oriented x 3. Insight, judgment intact.       IMAGING   Reviewed CTPE performed in ER during this admission   ASSESSMENT/PLAN   Acute on chronic hypoxemic respiratory failure    - overall presentation seems to be more consistent with viral LRTI induced mild exacerbation of CHF    - I reviewed his CT chest there are bronchitic changes bilaterally , this will be treated with short course of steroids    - he has cardiology evaluation and TTE in progress and this will be good to optimize underlying cardiac dysfunction    -Patient has stable chronic COPD without excess mucus production or darkened inspissated expectoration of phlegm -will reduce prednisone to 40 -BIPAP for now and repeat abg in 4h -dc lasix due to contraction alkalosis and hypercarbia  Additional active comorbid conditions:     -Advanced COPD and chronic hypoxemia      -Acute on chronic HFpEF     -Metabolic syndrome     -s/p TAVR     -Hypothyroidism     - Advanced Age     - Deconditioning      - GI/DVT ppx      Thank you for allowing me to participate in the care of this patient.  Patient/Family are satisfied with care plan and all questions have been answered.    Provider disclosure: Patient with at least one acute or chronic illness or injury that poses a threat to life or bodily function and is being managed actively during this encounter.  All of the below services have been performed independently by signing provider:  review of prior documentation from internal and or external health records.  Review of previous and current lab results.  Interview and comprehensive assessment during patient visit today. Review of current and previous chest radiographs/CT scans. Discussion of management and test interpretation with health care team and patient/family.   This document was prepared  using Dragon voice recognition software and may include unintentional dictation errors.     Ottie Glazier, M.D.  Division of Pulmonary & Critical Care Medicine

## 2022-05-10 DIAGNOSIS — R778 Other specified abnormalities of plasma proteins: Secondary | ICD-10-CM | POA: Diagnosis not present

## 2022-05-10 DIAGNOSIS — I5033 Acute on chronic diastolic (congestive) heart failure: Secondary | ICD-10-CM | POA: Diagnosis not present

## 2022-05-10 DIAGNOSIS — J9601 Acute respiratory failure with hypoxia: Secondary | ICD-10-CM | POA: Diagnosis not present

## 2022-05-10 DIAGNOSIS — I10 Essential (primary) hypertension: Secondary | ICD-10-CM | POA: Diagnosis not present

## 2022-05-10 DIAGNOSIS — J9602 Acute respiratory failure with hypercapnia: Secondary | ICD-10-CM | POA: Diagnosis not present

## 2022-05-10 LAB — CBC
HCT: 45.6 % (ref 39.0–52.0)
Hemoglobin: 13.9 g/dL (ref 13.0–17.0)
MCH: 29.8 pg (ref 26.0–34.0)
MCHC: 30.5 g/dL (ref 30.0–36.0)
MCV: 97.9 fL (ref 80.0–100.0)
Platelets: 285 10*3/uL (ref 150–400)
RBC: 4.66 MIL/uL (ref 4.22–5.81)
RDW: 12.8 % (ref 11.5–15.5)
WBC: 7.9 10*3/uL (ref 4.0–10.5)
nRBC: 0 % (ref 0.0–0.2)

## 2022-05-10 LAB — BASIC METABOLIC PANEL
Anion gap: 9 (ref 5–15)
BUN: 45 mg/dL — ABNORMAL HIGH (ref 8–23)
CO2: 40 mmol/L — ABNORMAL HIGH (ref 22–32)
Calcium: 9 mg/dL (ref 8.9–10.3)
Chloride: 86 mmol/L — ABNORMAL LOW (ref 98–111)
Creatinine, Ser: 1.15 mg/dL (ref 0.61–1.24)
GFR, Estimated: >60 mL/min (ref >60)
Glucose, Bld: 107 mg/dL — ABNORMAL HIGH (ref 70–99)
Potassium: 4.2 mmol/L (ref 3.5–5.1)
Sodium: 135 mmol/L (ref 135–145)

## 2022-05-10 MED ORDER — FUROSEMIDE 40 MG PO TABS
40.0000 mg | ORAL_TABLET | Freq: Two times a day (BID) | ORAL | Status: DC
  Administered 2022-05-10 – 2022-05-12 (×5): 40 mg via ORAL
  Filled 2022-05-10 (×5): qty 1

## 2022-05-10 MED ORDER — MAGNESIUM CITRATE PO SOLN
1.0000 | Freq: Once | ORAL | Status: DC
  Filled 2022-05-10 (×2): qty 296

## 2022-05-10 NOTE — Progress Notes (Addendum)
Rounding Note    Patient Name: Kenneth Larsen Date of Encounter: 05/10/2022  [place] Cardiologist: Ida Rogue, MD   Subjective   Denies any chest pain or shortness of breath.  He put out 300 cc yesterday and is net -1.96 L since admission.  Unclear that these are accurate I's and O's.  Containing O2 sats of 99% on 3 L O2.  Weight up 3 pounds from yesterday but down 3 pounds from admission.  He had problems with mental status changes yesterday related to hypoxemia as well as hypercapnea with ABG showing a PCO2 of 99.  He is currently on BiPAP.  Inpatient Medications    Scheduled Meds:  aspirin EC  81 mg Oral Daily   docusate sodium  100 mg Oral BID   enoxaparin (LOVENOX) injection  40 mg Subcutaneous Q24H   feeding supplement  237 mL Oral BID BM   guaiFENesin  600 mg Oral BID   levothyroxine  50 mcg Oral QAC breakfast   losartan  50 mg Oral Daily   metoprolol succinate  25 mg Oral Daily   multivitamin with minerals  1 tablet Oral Daily   polyethylene glycol  17 g Oral Daily   predniSONE  40 mg Oral Q breakfast   senna-docusate  1 tablet Oral BID   Continuous Infusions:  PRN Meds: acetaminophen **OR** acetaminophen, ipratropium-albuterol, ondansetron **OR** ondansetron (ZOFRAN) IV, mouth rinse   Vital Signs    Vitals:   05/10/22 0424 05/10/22 0512 05/10/22 0824 05/10/22 0842  BP: (!) 149/66  115/65   Pulse: 65  69   Resp: 14  17   Temp: 98.2 F (36.8 C)  97.8 F (36.6 C)   TempSrc: Oral     SpO2: 97%  100% 99%  Weight:  98.6 kg    Height:        Intake/Output Summary (Last 24 hours) at 05/10/2022 1123 Last data filed at 05/09/2022 1535 Gross per 24 hour  Intake 240 ml  Output 300 ml  Net -60 ml       05/10/2022    5:12 AM 05/09/2022    5:35 AM 05/08/2022    4:35 AM  Last 3 Weights  Weight (lbs) 217 lb 6 oz 214 lb 11.7 oz 215 lb 2.7 oz  Weight (kg) 98.6 kg 97.4 kg 97.6 kg      Telemetry    Normal sinus rhythm with PVCs- Personally  Reviewed  ECG    No new - Personally Reviewed  Physical Exam   GEN: Well nourished, well developed in no acute distress HEENT: Normal NECK: No JVD; No carotid bruits LYMPHATICS: No lymphadenopathy CARDIAC:RRR, no murmurs, rubs, gallops RESPIRATORY: Decreased breath sounds ABDOMEN: Soft, non-tender, non-distended MUSCULOSKELETAL: 1+ bilateral lower extremity edema; No deformity  SKIN: Warm and dry NEUROLOGIC:  Alert and oriented x 3 PSYCHIATRIC:  Normal affect   Labs    High Sensitivity Troponin:   Recent Labs  Lab 05/06/22 1746 05/06/22 2013  TROPONINIHS 31* 30*      Chemistry Recent Labs  Lab 05/06/22 1532 05/06/22 2012 05/08/22 0528 05/09/22 0454 05/10/22 0434  NA 129*   < > 133* 137 135  K 3.8   < > 4.4 4.8 4.2  CL 90*   < > 85* 86* 86*  CO2 32   < > 41* 41* 40*  GLUCOSE 131*   < > 118* 115* 107*  BUN 12   < > 19 31* 45*  CREATININE 0.80   < >  1.00 0.96 1.15  CALCIUM 8.4*   < > 9.2 9.1 9.0  PROT 6.4*  --   --   --   --   ALBUMIN 3.6  --   --   --   --   AST 15  --   --   --   --   ALT 10  --   --   --   --   ALKPHOS 59  --   --   --   --   BILITOT 1.2  --   --   --   --   GFRNONAA >60   < > >60 >60 >60  ANIONGAP 7   < > '7 10 9   '$ < > = values in this interval not displayed.     Lipids No results for input(s): "CHOL", "TRIG", "HDL", "LABVLDL", "LDLCALC", "CHOLHDL" in the last 168 hours.  Hematology Recent Labs  Lab 05/06/22 1532 05/06/22 2059 05/10/22 0434  WBC 7.1 6.6 7.9  RBC 5.03 4.73 4.66  HGB 15.2 14.3 13.9  HCT 47.2 44.7 45.6  MCV 93.8 94.5 97.9  MCH 30.2 30.2 29.8  MCHC 32.2 32.0 30.5  RDW 13.0 12.9 12.8  PLT 258 236 285    Thyroid  Recent Labs  Lab 05/06/22 2012  TSH 2.137     BNP Recent Labs  Lab 05/06/22 1532 05/09/22 0949  BNP 255.5* 55.2     DDimer No results for input(s): "DDIMER" in the last 168 hours.   Radiology    DG Chest Port 1 View  Result Date: 05/09/2022 CLINICAL DATA:  Bronchitis. EXAM: PORTABLE  CHEST 1 VIEW COMPARISON:  Chest radiograph dated May 06, 2022 FINDINGS: The heart size and mediastinal contours are within normal limits. Atherosclerotic calcification of the aortic arch. Trans aortic valve replacement. Elevation of the left hemidiaphragm with left basilar atelectasis. No focal consolidation or large pleural effusion. 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 IMPRESSION: No focal consolidation or large pleural effusion. Left basilar atelectasis with elevation of the left hemidiaphragm. Electronically Signed   By: Keane Police D.O.   On: 05/09/2022 09:46    Cardiac Studies   Echo 04/2022  1. Left ventricular ejection fraction, by estimation, is 55 to 60%. The  left ventricle has normal function. The left ventricle has no regional  wall motion abnormalities. Left ventricular diastolic parameters are  consistent with Grade I diastolic  dysfunction (impaired relaxation).   2. Right ventricular systolic function is normal. The right ventricular  size is normal. There is normal pulmonary artery systolic pressure. The  estimated right ventricular systolic pressure is 53.6 mmHg.   3. The mitral valve is normal in structure. No evidence of mitral valve  regurgitation. No evidence of mitral stenosis.   4. The aortic valve has been repaired/replaced. s/p TAVR. Aortic valve  regurgitation is not visualized. No aortic stenosis is present. Aortic  valve area, by VTI measures 1.45 cm. Aortic valve mean gradient measures  5.2 mmHg. Aortic valve Vmax measures  1.56 m/s.   5. The inferior vena cava is normal in size with greater than 50%  respiratory variability, suggesting right atrial pressure of 3 mmHg.   Echo 05/2021 1. Left ventricular ejection fraction, by estimation, is 60 to 65%. The  left ventricle has normal function. The left ventricle has no regional  wall motion abnormalities. Left ventricular diastolic parameters are  consistent with Grade I  diastolic  dysfunction (impaired relaxation).   2. Right ventricular  systolic function is normal. The right ventricular  size is normal. Tricuspid regurgitation signal is inadequate for assessing  PA pressure.   3. Left atrial size was moderately dilated.   4. The mitral valve is normal in structure. No evidence of mitral valve  regurgitation. No evidence of mitral stenosis.   5. The aortic valve was not well visualized. Aortic valve regurgitation  is not visualized. No aortic stenosis is present. There is a 29 mm Sapien  prosthetic (TAVR) valve present in the aortic position. Procedure Date:  11/15/18. Aortic valve area, by VTI  measures 2.72 cm. Aortic valve mean gradient measures 7.5 mmHg. Aortic  valve Vmax measures 2.13 m/s.   6. The inferior vena cava is normal in size with greater than 50%  respiratory variability, suggesting right atrial pressure of 3 mmHg.   7. Challenging images   Comparison(s): Previous AV velocities reported as 23 peak PG, 12 mean PG.    Cardiac cath 09/2018 RIGHT/LEFT HEART CATH AND CORONARY ANGIOGRAPHY    Conclusion   Conclusions: Mild to moderate, non-obstructive coronary artery disease. Moderately elevated left heart, right heart, and pulmonary artery pressures. Normal Fick cardiac output/index.   Recommendations: Medical therapy and risk factor modification to prevent progression of moderate coronary artery disease. Restart diuresis this afternoon; will initiate furosemide 40 mg IV BID. Outpatient TAVR clinic referral for further workup/management of severe aortic stenosis.   Nelva Bush, MD [facility] Pager: [phone]   Recommendations   Antiplatelet/Anticoag Recommend Aspirin '81mg'$  daily for moderate CAD.    Patient Profile     76 y.o. male with a hx of hypertension, COPD, tobacco history, chronic diastolic CHF, severe AS status post TAVR in 11/11/2018 who is being seen 05/07/2022 for the evaluation of heart  failure  Assessment & Plan    Acute on chronic diastolic heart failure - BNP 255 -Currently on IV lasix '40mg'$  BID - PTA lasix '40mg'$  daily>he was missing lasix doses at home - Echo this admission showed normal LVSF, no significant valvular disease, stable TAVR gradient - UOP -2.1L - Creatinine remained stable at 1.15 with diuresis - Saturations 100% on 3 L O2>> continue to wean O2 - volume status improving - CXRY yesterday with no edema BNP normal at 55 - Continue losartan 50 mg daily and Toprol-XL 25 mg daily  - Start Lasix 40 mg p.o. twice daily for now - Still has lower extremity edema on exam so we will place TED compression hose - Follow daily I's and O's, daily weights and renal function   Acute respiratory failure with hypoxia and hypercapnia - presented with worsening dyspnea requiring up to 6L, now down to 3L - CHF and COPD contributing to breathing issues. He uses nighttime O2  - he was missing lasix doses at home - CTA chest negative for PE - steroids per pulmonology - Pulmonology is following, he may need O2 at home.    Severe AS s/p TAVR in 2020 - repeat echo showed well functioning aortic valve   Elevated troponin - minimally elevated to the 30s in the setting of acute heart failure - suspect demand ischemia in the setting of acute heart failure and respiratory failure - No chest pain reported - Cath in 2020 showed mild nonobstructive CAD - continue ASA   Hyponatremia - in the setting of volume overload - Na now 135 - improving with diuresis>now normal   COPD with mild exacerbation - treated with Duonebs in the ED - will need follow-up in the  outpatient setting  I have spent a total of 30 minutes with patient reviewing 2D echo , telemetry, EKGs, labs and examining patient as well as establishing an assessment and plan that was discussed with the patient.  > 50% of time was spent in direct patient care.     For questions or updates, please contact [place] Please consult [email or web address] for contact info under        Signed, Fransico Him, MD  05/10/2022, 11:23 AM

## 2022-05-10 NOTE — Progress Notes (Signed)
PROGRESS NOTE    Kenneth Larsen  YNW:[id number] DOB: April 29, 1946 DOA: 05/06/2022 PCP: [facility] [place]   Brief Narrative:  Patient is a 76 year old male with history of hypertension, hypothyroidism, previous smoker, COPD, diastolic CHF, aortic stenosis status post TAVR who presented with 3-day history of shortness of breath, weakness, mild lower extremity edema.  On presentation, he was hypertensive, tachypneic, requiring 5 L of oxygen.  COVID/flu screen test negative.  CT chest negative for PE without any acute pulmonary findings.  Patient was admitted for the management of acute and chronic diastolic CHF exacerbation along with COPD.  Started on IV lasix.  Cardiology, pulmonology following  Assessment & Plan:   Acute respiratory failure with hypoxia/hypercapnia:  - Secondary to CHF exacerbation,possible  concurrent COPD exacerbation.   -CT angio negative for PE.   -On BiPAP.  PCCM following-appreciate help -Chest x-ray shows no acute findings.  Acute on chronic diastolic CHF:  -echo shows EF of 55 to 60%. -Chest x-ray from 9/16: No pulmonary edema.  BNP normal at 55 -Continue losartan, metoprolol -Card neurology resume Lasix 40 twice daily -Strict I and O's and daily weight and continue to monitor kidney function  COPD with exacerbation:  -On BiPAP -continue bronchodilators and steroids -PCCM on board-appreciate help  Elevated troponin: -Likely demand ischemia in the setting of heart failure and respiratory failure -He had cath in 2020 that showed mild nonobstructive CAD.  Continue aspirin.  Patient denies any chest pain   Severe aortic stenosis: Status post TAVR   Hypertension: Continue losartan, metoprolol.  Continue to monitor blood pressure.     Hypothyroidism: Continue levothyroxine   Constipation: Continue bowel regimen.  Added mag citrate  Hyponatremia: Likely from hypervolemic status from CHF.  Resolved   Debility/deconditioning: Patient seen by  PT/OT and recommended home health on discharge.     Patient is at increased risk for morbidity and mortality.  DVT prophylaxis: Lovenox Code Status: DNR Family Communication: Patient's wife at the bedside present at bedside.  Plan of care discussed with patient in length and he verbalized understanding and agreed with it. Disposition Plan: To be determined  Consultants:  Cardiology PCCM  Procedures:  None  Antimicrobials:  None  Status is: Inpatient    Subjective: Patient seen and examined.  Sitting comfortably on the bed.  On BiPAP.  Wife at the bedside reports improvement in mentation after BiPAP Placement.  Patient denies any shortness of breath or wheezing.  No chest pain.  No acute events overnight.  Wife is concerned about his constipation and requested for mag citrate.    Objective: Vitals:   05/10/22 0424 05/10/22 0512 05/10/22 0824 05/10/22 0842  BP: (!) 149/66  115/65   Pulse: 65  69   Resp: 14  17   Temp: 98.2 F (36.8 C)  97.8 F (36.6 C)   TempSrc: Oral     SpO2: 97%  100% 99%  Weight:  98.6 kg    Height:        Intake/Output Summary (Last 24 hours) at 05/10/2022 1142 Last data filed at 05/09/2022 1535 Gross per 24 hour  Intake 240 ml  Output 300 ml  Net -60 ml    Filed Weights   05/08/22 0435 05/09/22 0535 05/10/22 0512  Weight: 97.6 kg 97.4 kg 98.6 kg    Examination:  General exam: Appears calm and comfortable, on BiPAP, wife at the bedside  respiratory system: Clear to auscultation. Respiratory effort normal. Cardiovascular system: S1 & S2 heard, RRR. No  JVD, murmurs, rubs, gallops or clicks.  1+ bilateral pitting edema positive  gastrointestinal system: Abdomen is nondistended, soft and nontender. No organomegaly or masses felt. Normal bowel sounds heard. Central nervous system: Alert and oriented. No focal neurological deficits. Extremities: Symmetric 5 x 5 power. Skin: No rashes, lesions or ulcers Psychiatry: Judgement and insight appear  normal. Mood & affect appropriate.    Data Reviewed: I have personally reviewed following labs and imaging studies  CBC: Recent Labs  Lab 05/06/22 1532 05/06/22 2059 05/10/22 0434  WBC 7.1 6.6 7.9  NEUTROABS 5.8  --   --   HGB 15.2 14.3 13.9  HCT 47.2 44.7 45.6  MCV 93.8 94.5 97.9  PLT 258 236 939    Basic Metabolic Panel: Recent Labs  Lab 05/06/22 1532 05/06/22 2012 05/07/22 0521 05/08/22 0528 05/09/22 0454 05/10/22 0434  NA 129*  --  132* 133* 137 135  K 3.8  --  3.7 4.4 4.8 4.2  CL 90*  --  88* 85* 86* 86*  CO2 32  --  36* 41* 41* 40*  GLUCOSE 131*  --  100* 118* 115* 107*  BUN 12  --  9 19 31* 45*  CREATININE 0.80 0.72 0.78 1.00 0.96 1.15  CALCIUM 8.4*  --  8.3* 9.2 9.1 9.0    GFR: Estimated Creatinine Clearance: 66.5 mL/min (by C-G formula based on SCr of 1.15 mg/dL). Liver Function Tests: Recent Labs  Lab 05/06/22 1532  AST 15  ALT 10  ALKPHOS 59  BILITOT 1.2  PROT 6.4*  ALBUMIN 3.6    No results for input(s): "LIPASE", "AMYLASE" in the last 168 hours. No results for input(s): "AMMONIA" in the last 168 hours. Coagulation Profile: Recent Labs  Lab 05/06/22 1532  INR 1.1    Cardiac Enzymes: No results for input(s): "CKTOTAL", "CKMB", "CKMBINDEX", "TROPONINI" in the last 168 hours. BNP (last 3 results) No results for input(s): "PROBNP" in the last 8760 hours. HbA1C: No results for input(s): "HGBA1C" in the last 72 hours. CBG: No results for input(s): "GLUCAP" in the last 168 hours. Lipid Profile: No results for input(s): "CHOL", "HDL", "LDLCALC", "TRIG", "CHOLHDL", "LDLDIRECT" in the last 72 hours. Thyroid Function Tests: No results for input(s): "TSH", "T4TOTAL", "FREET4", "T3FREE", "THYROIDAB" in the last 72 hours.  Anemia Panel: No results for input(s): "VITAMINB12", "FOLATE", "FERRITIN", "TIBC", "IRON", "RETICCTPCT" in the last 72 hours. Sepsis Labs: Recent Labs  Lab 05/06/22 1532 05/06/22 1746  LATICACIDVEN 1.0 0.6      Recent Results (from the past 240 hour(s))  Culture, blood (Routine x 2)     Status: None (Preliminary result)   Collection Time: 05/06/22  3:32 PM   Specimen: BLOOD  Result Value Ref Range Status   Specimen Description BLOOD BLOOD RIGHT WRIST  Final   Special Requests   Final    BOTTLES DRAWN AEROBIC AND ANAEROBIC Blood Culture adequate volume   Culture   Final    NO GROWTH 4 DAYS Performed at [facility], [address], [place], [place] [address]    Report Status PENDING  Incomplete  Culture, blood (Routine x 2)     Status: None (Preliminary result)   Collection Time: 05/06/22  4:26 PM   Specimen: BLOOD  Result Value Ref Range Status   Specimen Description BLOOD BLOOD LEFT ARM  Final   Special Requests   Final    BOTTLES DRAWN AEROBIC AND ANAEROBIC Blood Culture results may not be optimal due to an inadequate volume of blood  received in culture bottles   Culture   Final    NO GROWTH 4 DAYS Performed at [facility], [address] [place], [place] [address]    Report Status PENDING  Incomplete  Resp Panel by RT-PCR (Flu A&B, Covid) Anterior Nasal Swab     Status: None   Collection Time: 05/06/22  6:45 PM   Specimen: Anterior Nasal Swab  Result Value Ref Range Status   SARS Coronavirus 2 by RT PCR NEGATIVE NEGATIVE Final    Comment: (NOTE) SARS-CoV-2 target nucleic acids are NOT DETECTED.  The SARS-CoV-2 RNA is generally detectable in upper respiratory specimens during the acute phase of infection. The lowest concentration of SARS-CoV-2 viral copies this assay can detect is 138 copies/mL. A negative result does not preclude SARS-Cov-2 infection and should not be used as the sole basis for treatment or other patient management decisions. A negative result may occur with  improper specimen collection/handling, submission of specimen other than nasopharyngeal swab, presence of viral mutation(s) within the areas targeted by this assay, and  inadequate number of viral copies(<138 copies/mL). A negative result must be combined with clinical observations, patient history, and epidemiological information. The expected result is Negative.  Fact Sheet for Patients:  EntrepreneurPulse.com.au  Fact Sheet for Healthcare Providers:  IncredibleEmployment.be  This test is no t yet approved or cleared by the [place] FDA and  has been authorized for detection and/or diagnosis of SARS-CoV-2 by FDA under an Emergency Use Authorization (EUA). This EUA will remain  in effect (meaning this test can be used) for the duration of the COVID-19 declaration under Section 564(b)(1) of the Act, 21 U.S.C.section 360bbb-3(b)(1), unless the authorization is terminated  or revoked sooner.       Influenza A by PCR NEGATIVE NEGATIVE Final   Influenza B by PCR NEGATIVE NEGATIVE Final    Comment: (NOTE) The Xpert Xpress SARS-CoV-2/FLU/RSV plus assay is intended as an aid in the diagnosis of influenza from Nasopharyngeal swab specimens and should not be used as a sole basis for treatment. Nasal washings and aspirates are unacceptable for Xpert Xpress SARS-CoV-2/FLU/RSV testing.  Fact Sheet for Patients: EntrepreneurPulse.com.au  Fact Sheet for Healthcare Providers: IncredibleEmployment.be  This test is not yet approved or cleared by the [place] FDA and has been authorized for detection and/or diagnosis of SARS-CoV-2 by FDA under an Emergency Use Authorization (EUA). This EUA will remain in effect (meaning this test can be used) for the duration of the COVID-19 declaration under Section 564(b)(1) of the Act, 21 U.S.C. section 360bbb-3(b)(1), unless the authorization is terminated or revoked.  Performed at [facility], [address], [place], [place] [address]       Radiology Studies: [facility] Chest [place] 1 View  Result Date: 05/09/2022 CLINICAL DATA:   Bronchitis. EXAM: PORTABLE CHEST 1 VIEW COMPARISON:  Chest radiograph dated May 06, 2022 FINDINGS: The heart size and mediastinal contours are within normal limits. Atherosclerotic calcification of the aortic arch. Trans aortic valve replacement. Elevation of the left hemidiaphragm with left basilar atelectasis. No focal consolidation or large pleural effusion. 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 IMPRESSION: No focal consolidation or large pleural effusion. Left basilar atelectasis with elevation of the left hemidiaphragm. Electronically Signed   By: Keane Police D.O.   On: 05/09/2022 09:46    Scheduled Meds:  aspirin EC  81 mg Oral Daily   docusate sodium  100 mg Oral BID   enoxaparin (LOVENOX) injection  40 mg Subcutaneous  Q24H   feeding supplement  237 mL Oral BID BM   furosemide  40 mg Oral BID   guaiFENesin  600 mg Oral BID   levothyroxine  50 mcg Oral QAC breakfast   losartan  50 mg Oral Daily   magnesium citrate  1 Bottle Oral Once   metoprolol succinate  25 mg Oral Daily   multivitamin with minerals  1 tablet Oral Daily   polyethylene glycol  17 g Oral Daily   predniSONE  40 mg Oral Q breakfast   senna-docusate  1 tablet Oral BID   Continuous Infusions:   LOS: 3 days   Time spent: 45 minutes   Lynnae Ludemann Loann Quill, MD [facility]  If 7PM-7AM, please contact night-coverage [email or web address] 05/10/2022, 11:42 AM

## 2022-05-10 NOTE — Progress Notes (Signed)
Family is concerned about attaining a sleep study ASAP following discharge.  Family voices preference of completion prior to discharge.  If case management could reach out with a plan, they would appreciate it.

## 2022-05-10 NOTE — Progress Notes (Signed)
Physical Therapy Treatment Patient Details Name: Kenneth Larsen MRN: [id number] DOB: 14-Mar-1946 Today's Date: 05/10/2022   History of Present Illness Pt is a 76 year old male with history of hypertension, hypothyroidism, previous smoker, COPD, diastolic CHF, aortic stenosis status post TAVR who presented with 3-day history of shortness of breath, weakness, mild lower extremity edema.    PT Comments    Pt with recent medical decline, requiring BiPAP yesterday. Has since been weaned to 3L of supplemental O2 via [place]. At rest, pt's SpO2 at 94%, decreasing to as low as 80% with activity. Pt also with functional decline secondary to yesterday's medical decline; however, was still able to tolerate transfers with min guard and short distance ambulation with use of RW and min guard with occasional min A for stability. Wife reports concerns with d/c home and is requesting a sleep study prior to his d/c so that his CPAP will be available for home use. Also feel that from a PT perspective pt would greatly benefit from another day in the hospital to hopefully continue to build up his strength prior to d/c'ing home with spouse's support. They do not want pt to d/c to SNF. PT will continue to f/u with pt acutely to progress mobility as tolerated.   Recommendations for follow up therapy are one component of a multi-disciplinary discharge planning process, led by the attending physician.  Recommendations may be updated based on patient status, additional functional criteria and insurance authorization.  Follow Up Recommendations  Home health PT     Assistance Recommended at Discharge Frequent or constant Supervision/Assistance  Patient can return home with the following A little help with walking and/or transfers;A little help with bathing/dressing/bathroom;Assistance with cooking/housework;Assist for transportation;Help with stairs or ramp for entrance   Equipment Recommendations  None recommended by PT     Recommendations for Other Services       Precautions / Restrictions Precautions Precautions: Fall Precaution Comments: monitor SpO2 Restrictions Weight Bearing Restrictions: No     Mobility  Bed Mobility               General bed mobility comments: pt OOB in chair upon PT arrival    Transfers Overall transfer level: Needs assistance Equipment used: Rolling walker (2 wheels) Transfers: Sit to/from Stand Sit to Stand: Min guard           General transfer comment: pt able to complete sit-to-stand transfer x1 from chair and x1 from EOB with min guard for safety, increased time and effort needed    Ambulation/Gait Ambulation/Gait assistance: Min guard, Min assist Gait Distance (Feet): 10 Feet Assistive device: Rolling walker (2 wheels) Gait Pattern/deviations: Step-to pattern, Decreased step length - right, Decreased step length - left, Step-through pattern, Decreased stride length, Shuffle Gait velocity: decreased     General Gait Details: pt with mild instability with use of RW, shuffling gait pattern with L lateral lean, limited secondary to pulmonary endurance   Stairs             Wheelchair Mobility    Modified Rankin (Stroke Patients Only)       Balance Overall balance assessment: Needs assistance Sitting-balance support: Feet supported, No upper extremity supported Sitting balance-Leahy Scale: Fair     Standing balance support: During functional activity, Bilateral upper extremity supported, Single extremity supported Standing balance-Leahy Scale: Poor  Cognition Arousal/Alertness: Awake/alert Behavior During Therapy: WFL for tasks assessed/performed Overall Cognitive Status: Impaired/Different from baseline Area of Impairment: Safety/judgement                         Safety/Judgement: Decreased awareness of safety              Exercises      General Comments         Pertinent Vitals/Pain Pain Assessment Pain Assessment: No/denies pain    Home Living                          Prior Function            PT Goals (current goals can now be found in the care plan section) Acute Rehab PT Goals PT Goal Formulation: With patient Time For Goal Achievement: 05/21/22 Potential to Achieve Goals: Good Progress towards PT goals: Progressing toward goals    Frequency    Min 2X/week      PT Plan Current plan remains appropriate    Co-evaluation              AM-PAC PT "6 Clicks" Mobility   Outcome Measure  Help needed turning from your back to your side while in a flat bed without using bedrails?: None Help needed moving from lying on your back to sitting on the side of a flat bed without using bedrails?: None Help needed moving to and from a bed to a chair (including a wheelchair)?: A Little Help needed standing up from a chair using your arms (e.g., wheelchair or bedside chair)?: A Little Help needed to walk in hospital room?: A Little Help needed climbing 3-5 steps with a railing? : A Lot 6 Click Score: 19    End of Session Equipment Utilized During Treatment: Oxygen Activity Tolerance: Patient limited by fatigue Patient left: in chair;with call bell/phone within reach;with family/visitor present Nurse Communication: Mobility status;Other (comment) (SpO2 levels with activity) PT Visit Diagnosis: Other abnormalities of gait and mobility (R26.89)     Time: 3419-3790 PT Time Calculation (min) (ACUTE ONLY): 33 min  Charges:  $Gait Training: 8-22 mins $Therapeutic Activity: 8-22 mins                     Anastasio Champion, DPT  Acute Rehabilitation Services Office [place] 05/10/2022, 12:01 PM

## 2022-05-10 NOTE — Progress Notes (Signed)
PULMONOLOGY         Date: 05/10/2022,   MRN# [id number] Kenneth Larsen [birth year]     AdmissionWeight: 99.8 kg                 CurrentWeight: 98.6 kg  Referring provider: Dr Tawanna Solo   CHIEF COMPLAINT:   Acute on chronic hypoxemic respiratory failure   HISTORY OF PRESENT ILLNESS   76 yo M with hx of essential hypertension, hypothyroidism, lilfelong smoker with COPD and chronic hypoxemia on O2 in the past, hx of CHF with TAVR and preserved ejection fraction came in with chest discomfort and SOB associated with malaise and LE edema.  He was noted to be tachyarrythmic and dyspneic found to have hypoxemia requiring 5L/min [place].  He had blood work done in ER with mild elevation of BNP >250 mild hyponatremia with hypervolemic fluid status, mild hypertension, normal VBG on 5L O2, essentially normal CBC and viral workup was negative for covid, and flu.  He had CT chest which I reviewed with findings of bronchitic changes diffusely and bilaterally to moderate severity with absence of substantial pulmonary edema, pleural effusions, consolidated infiltrate negative for PE, no areas of frank honeycombing or pneumothorax. He was able to speak in full sentences and endorses chest discomfort unlike his normal dyspnea. He denies constitutional symptoms. PCCM consultation for further evaluation and management.   05/08/22- patient feels improved, his wife shares he had episode of resp distress overnight but she thinks this was due to O2 line kinking since patient was unable to appreciate any air flow through nasal canula.  We discussed being careful when moving around to NOT sit on or bend cordage for oxygen therapy.  He is cleared from pulmonary standpoint and is able to finish prednisone taper for bronchitis.  Please reduce prednisone by '5mg'$  daily.   05/09/22- patient seems more slow to answer and encephalopathic during interview today.  He was noted to be confused by wife and she communicated this  with RN.  An abg was done with findings of hypercapnic respiratory failure.  I have discussed this with family and ordered bipap for now.  He may need to be monitored in PCU.  Im stopping his lasix for now due to worsening hypercarbia with contraction alkalosis. Reducing prednisone to '40mg'$  from 50.  Will need to hold DC today    05/10/22 - patient is improved with lucid mentation.  We discussed needing BIPAP on outpatient,  I have submitted request to [employer] help to assist patient acquiring medical equipment.    PAST MEDICAL HISTORY   Past Medical History:  Diagnosis Date   Arthritis    Cancer ([place])    2010-melanoma   Chronic diastolic (congestive) heart failure ([facility])    COPD (chronic obstructive pulmonary disease) ([place])    Hypertension    Hypothyroidism    Severe aortic stenosis      SURGICAL HISTORY   Past Surgical History:  Procedure Laterality Date   CARDIAC CATHETERIZATION     RIGHT/LEFT HEART CATH AND CORONARY ANGIOGRAPHY N/A 10/13/2018   Procedure: RIGHT/LEFT HEART CATH AND CORONARY ANGIOGRAPHY;  Surgeon: Nelva Bush, MD;  Location: [facility];  Service: Cardiovascular;  Laterality: N/A;   TEE WITHOUT CARDIOVERSION N/A 11/15/2018   Procedure: TRANSESOPHAGEAL ECHOCARDIOGRAM (TEE);  Surgeon: Burnell Blanks, MD;  Location: [facility];  Service: Open Heart Surgery;  Laterality: N/A;   TRANSCATHETER AORTIC VALVE REPLACEMENT, TRANSFEMORAL  11/15/2018   TRANSCATHETER AORTIC VALVE REPLACEMENT,  TRANSFEMORAL N/A 11/15/2018   Procedure: TRANSCATHETER AORTIC VALVE REPLACEMENT, TRANSFEMORAL;  Surgeon: Burnell Blanks, MD;  Location: [place] [facility];  Service: Open Heart Surgery;  Laterality: N/A;   VASECTOMY       FAMILY HISTORY   Family History  Problem Relation Age of Onset   Cancer Father      SOCIAL HISTORY   Social History   Tobacco Use   Smoking status: Former    Packs/day: 1.00    Years: 25.00    Total pack years: 25.00     Types: Cigarettes   Smokeless tobacco: Never  Vaping Use   Vaping Use: Never used  Substance Use Topics   Alcohol use: Never   Drug use: Never     MEDICATIONS    Home Medication:    Current Medication:  Current Facility-Administered Medications:    acetaminophen (TYLENOL) tablet 650 mg, 650 mg, Oral, Q6H PRN **OR** acetaminophen (TYLENOL) suppository 650 mg, 650 mg, Rectal, Q6H PRN, Athena Masse, MD   aspirin EC tablet 81 mg, 81 mg, Oral, Daily, Judd Gaudier V, MD, 81 mg at 05/10/22 0859   docusate sodium (COLACE) capsule 100 mg, 100 mg, Oral, BID, Sharion Settler, NP, 100 mg at 05/10/22 0859   enoxaparin (LOVENOX) injection 40 mg, 40 mg, Subcutaneous, Q24H, Judd Gaudier V, MD, 40 mg at 05/09/22 2119   feeding supplement (ENSURE ENLIVE / ENSURE PLUS) liquid 237 mL, 237 mL, Oral, BID BM, Adhikari, Amrit, MD, 237 mL at 05/10/22 0912   furosemide (LASIX) tablet 40 mg, 40 mg, Oral, BID, Turner, Traci R, MD, 40 mg at 05/10/22 1347   guaiFENesin (MUCINEX) 12 hr tablet 600 mg, 600 mg, Oral, BID, Adhikari, Amrit, MD, 600 mg at 05/10/22 0906   ipratropium-albuterol (DUONEB) 0.5-2.5 (3) MG/3ML nebulizer solution 3 mL, 3 mL, Nebulization, Q4H PRN, Athena Masse, MD   levothyroxine (SYNTHROID) tablet 50 mcg, 50 mcg, Oral, QAC breakfast, Judd Gaudier V, MD, 50 mcg at 05/10/22 0553   losartan (COZAAR) tablet 50 mg, 50 mg, Oral, Daily, Gollan, Kathlene November, MD, 50 mg at 05/10/22 0900   magnesium citrate solution 1 Bottle, 1 Bottle, Oral, Once, Pahwani, Rinka R, MD   metoprolol succinate (TOPROL-XL) 24 hr tablet 25 mg, 25 mg, Oral, Daily, Gollan, Kathlene November, MD, 25 mg at 05/10/22 0900   multivitamin with minerals tablet 1 tablet, 1 tablet, Oral, Daily, Adhikari, Amrit, MD, 1 tablet at 05/10/22 0859   ondansetron (ZOFRAN) tablet 4 mg, 4 mg, Oral, Q6H PRN **OR** ondansetron (ZOFRAN) injection 4 mg, 4 mg, Intravenous, Q6H PRN, Athena Masse, MD   Oral care mouth rinse, 15 mL, Mouth Rinse, PRN,  Pahwani, Rinka R, MD   polyethylene glycol (MIRALAX / GLYCOLAX) packet 17 g, 17 g, Oral, Daily, Adhikari, Amrit, MD, 17 g at 05/10/22 0859   predniSONE (DELTASONE) tablet 40 mg, 40 mg, Oral, Q breakfast, Pahwani, Rinka R, MD, 40 mg at 05/10/22 0900   senna-docusate (Senokot-S) tablet 1 tablet, 1 tablet, Oral, BID, Adhikari, Amrit, MD, 1 tablet at 05/10/22 0900    ALLERGIES   Penicillins     REVIEW OF SYSTEMS    Review of Systems:  Gen:  Denies  fever, sweats, chills weigh loss  HEENT: Denies blurred vision, double vision, ear pain, eye pain, hearing loss, nose bleeds, sore throat Cardiac:  No dizziness, chest pain or heaviness, chest tightness,edema Resp:   reports dyspnea chronically  Gi: Denies swallowing difficulty, stomach pain, nausea or vomiting, diarrhea, constipation, bowel  incontinence Gu:  Denies bladder incontinence, burning urine Ext:   Denies Joint pain, stiffness or swelling Skin: Denies  skin rash, easy bruising or bleeding or hives Endoc:  Denies polyuria, polydipsia , polyphagia or weight change Psych:   Denies depression, insomnia or hallucinations   Other:  All other systems negative   VS: BP (!) 122/51 (BP Location: Right Arm)   Pulse (!) 59   Temp 98.2 F (36.8 C)   Resp 18   Ht '6\' 1"'$  (1.854 m)   Wt 98.6 kg   SpO2 98%   BMI 28.68 kg/m      PHYSICAL EXAM    GENERAL:NAD, no fevers, chills, no weakness no fatigue HEAD: Normocephalic, atraumatic.  EYES: Pupils equal, round, reactive to light. Extraocular muscles intact. No scleral icterus.  MOUTH: Moist mucosal membrane. Dentition intact. No abscess noted.  EAR, NOSE, THROAT: Clear without exudates. No external lesions.  NECK: Supple. No thyromegaly. No nodules. No JVD.  PULMONARY: decreased breath sounds with mild rhonchi worse at bases bilaterally.  CARDIOVASCULAR: S1 and S2. Regular rate and rhythm. No murmurs, rubs, or gallops. No edema. Pedal pulses 2+ bilaterally.  GASTROINTESTINAL:  Soft, nontender, nondistended. No masses. Positive bowel sounds. No hepatosplenomegaly.  MUSCULOSKELETAL: No swelling, clubbing, or edema. Range of motion full in all extremities.  NEUROLOGIC: Cranial nerves II through XII are intact. No gross focal neurological deficits. Sensation intact. Reflexes intact.  SKIN: No ulceration, lesions, rashes, or cyanosis. Skin warm and dry. Turgor intact.  PSYCHIATRIC: Mood, affect within normal limits. The patient is awake, alert and oriented x 3. Insight, judgment intact.       IMAGING   Reviewed CTPE performed in ER during this admission   ASSESSMENT/PLAN   Acute on chronic hypoxemic respiratory failure    - overall presentation seems to be more consistent with viral LRTI induced mild exacerbation of CHF    - I reviewed his CT chest there are bronchitic changes bilaterally , this will be treated with short course of steroids    - he has cardiology evaluation and TTE in progress and this will be good to optimize underlying cardiac dysfunction    -Patient has stable chronic COPD without excess mucus production or darkened inspissated expectoration of phlegm -will reduce prednisone to 40 -BIPAP for now and repeat abg in 4h -dc lasix due to contraction alkalosis and hypercarbia  Additional active comorbid conditions:     -Advanced COPD and chronic hypoxemia      -Acute on chronic HFpEF     -Metabolic syndrome     -s/p TAVR     -Hypothyroidism     - Advanced Age     - Deconditioning      - GI/DVT ppx      Thank you for allowing me to participate in the care of this patient.  Patient/Family are satisfied with care plan and all questions have been answered.    Provider disclosure: Patient with at least one acute or chronic illness or injury that poses a threat to life or bodily function and is being managed actively during this encounter.  All of the below services have been performed independently by signing provider:  review of prior  documentation from internal and or external health records.  Review of previous and current lab results.  Interview and comprehensive assessment during patient visit today. Review of current and previous chest radiographs/CT scans. Discussion of management and test interpretation with health care team and patient/family.   This document was  prepared using Systems analyst and may include unintentional dictation errors.     Ottie Glazier, M.D.  Division of Pulmonary & Critical Care Medicine

## 2022-05-11 DIAGNOSIS — I151 Hypertension secondary to other renal disorders: Secondary | ICD-10-CM

## 2022-05-11 DIAGNOSIS — E871 Hypo-osmolality and hyponatremia: Secondary | ICD-10-CM | POA: Diagnosis not present

## 2022-05-11 DIAGNOSIS — J9622 Acute and chronic respiratory failure with hypercapnia: Secondary | ICD-10-CM | POA: Diagnosis not present

## 2022-05-11 DIAGNOSIS — E669 Obesity, unspecified: Secondary | ICD-10-CM

## 2022-05-11 DIAGNOSIS — J441 Chronic obstructive pulmonary disease with (acute) exacerbation: Secondary | ICD-10-CM | POA: Diagnosis not present

## 2022-05-11 DIAGNOSIS — J9621 Acute and chronic respiratory failure with hypoxia: Secondary | ICD-10-CM

## 2022-05-11 DIAGNOSIS — E66811 Obesity, class 1: Secondary | ICD-10-CM

## 2022-05-11 DIAGNOSIS — N2889 Other specified disorders of kidney and ureter: Secondary | ICD-10-CM

## 2022-05-11 DIAGNOSIS — I5033 Acute on chronic diastolic (congestive) heart failure: Secondary | ICD-10-CM | POA: Diagnosis not present

## 2022-05-11 LAB — BLOOD GAS, ARTERIAL
Acid-Base Excess: 20.4 mmol/L — ABNORMAL HIGH (ref 0.0–2.0)
Bicarbonate: 50.8 mmol/L — ABNORMAL HIGH (ref 20.0–28.0)
O2 Saturation: 96.8 %
Patient temperature: 37
pCO2 arterial: 84 mmHg (ref 32–48)
pH, Arterial: 7.39 (ref 7.35–7.45)
pO2, Arterial: 74 mmHg — ABNORMAL LOW (ref 83–108)

## 2022-05-11 LAB — CULTURE, BLOOD (ROUTINE X 2)
Culture: NO GROWTH
Culture: NO GROWTH
Special Requests: ADEQUATE

## 2022-05-11 LAB — BASIC METABOLIC PANEL
Anion gap: 7 (ref 5–15)
BUN: 40 mg/dL — ABNORMAL HIGH (ref 8–23)
CO2: 42 mmol/L — ABNORMAL HIGH (ref 22–32)
Calcium: 8.7 mg/dL — ABNORMAL LOW (ref 8.9–10.3)
Chloride: 87 mmol/L — ABNORMAL LOW (ref 98–111)
Creatinine, Ser: 0.94 mg/dL (ref 0.61–1.24)
GFR, Estimated: >60 mL/min (ref >60)
Glucose, Bld: 116 mg/dL — ABNORMAL HIGH (ref 70–99)
Potassium: 4.3 mmol/L (ref 3.5–5.1)
Sodium: 136 mmol/L (ref 135–145)

## 2022-05-11 LAB — MAGNESIUM: Magnesium: 2.3 mg/dL (ref 1.7–2.4)

## 2022-05-11 MED ORDER — DAPAGLIFLOZIN PROPANEDIOL 10 MG PO TABS
10.0000 mg | ORAL_TABLET | Freq: Every day | ORAL | Status: DC
  Filled 2022-05-11 (×2): qty 1

## 2022-05-11 MED ORDER — MOMETASONE FURO-FORMOTEROL FUM 200-5 MCG/ACT IN AERO
2.0000 | INHALATION_SPRAY | Freq: Two times a day (BID) | RESPIRATORY_TRACT | Status: DC
  Administered 2022-05-12: 2 via RESPIRATORY_TRACT
  Filled 2022-05-11: qty 8.8

## 2022-05-11 NOTE — Plan of Care (Signed)
  Problem: Activity: Goal: Capacity to carry out activities will improve Outcome: Adequate for Discharge   Problem: Cardiac: Goal: Ability to achieve and maintain adequate cardiopulmonary perfusion will improve Outcome: Adequate for Discharge   Problem: Education: Goal: Knowledge of disease or condition will improve Outcome: Adequate for Discharge Goal: Individualized Educational Video(s) Outcome: Adequate for Discharge   Problem: Activity: Goal: Ability to tolerate increased activity will improve Outcome: Adequate for Discharge Goal: Will verbalize the importance of balancing activity with adequate rest periods Outcome: Adequate for Discharge   Problem: Respiratory: Goal: Ability to maintain a clear airway will improve Outcome: Adequate for Discharge Goal: Levels of oxygenation will improve Outcome: Adequate for Discharge Goal: Ability to maintain adequate ventilation will improve Outcome: Adequate for Discharge

## 2022-05-11 NOTE — Progress Notes (Signed)
Kenneth Larsen presents with acute on chronic hypoxic and hypercapnic respiratory failure due to COPD.  The use of the NIV will treat patient's high PC02 levels (80 with an elevated bicarb of 48.4 on 05/09/22).  Therefore, NIV use can reduce risk of exacerbations and future hospitalizations when used at night and during the day.  All alternate devices [id number] and [id number]) have been considered and ruled out as volume requirements are not met by BiLevel devices.  An NIV with volume-targeted pressure support is necessary to prevent patient from life-threatening harm.  Interruption or failure to provide NIV would quickly lead to exacerbation of the patient's condition, hospital re-admission, and likely harm to the patient. Continued use is preferred.  Patient is able to protect their airways and clear secretions on their own.

## 2022-05-11 NOTE — Progress Notes (Signed)
Physical Therapy Treatment Patient Details Name: Kenneth Larsen MRN: [id number] DOB: 15-Jul-1946 Today's Date: 05/11/2022   History of Present Illness Pt is a 76 year old male with history of hypertension, hypothyroidism, previous smoker, COPD, diastolic CHF, aortic stenosis status post TAVR who presented with 3-day history of shortness of breath, weakness, mild lower extremity edema.    PT Comments    Patient alert, agreeable to PT sitting on BSC at start of session. He was able to perform transfers with supervision (with and without RW, but did require at least unilateral support via furniture without the RW). He ambulated ~275f with RW and CGA. Instructed in standing rest breaks to encourage activity modifications as needed and to allow assessment of spO2 (ranging from 87-91% on 3L). Returned to room and in bed with all needs in reach and questions answered as able. The patient would benefit from further skilled PT intervention to continue to progress towards goals. Recommendation remains appropriate.       Recommendations for follow up therapy are one component of a multi-disciplinary discharge planning process, led by the attending physician.  Recommendations may be updated based on patient status, additional functional criteria and insurance authorization.  Follow Up Recommendations  Home health PT     Assistance Recommended at Discharge Frequent or constant Supervision/Assistance  Patient can return home with the following A little help with walking and/or transfers;A little help with bathing/dressing/bathroom;Assistance with cooking/housework;Assist for transportation;Help with stairs or ramp for entrance   Equipment Recommendations  None recommended by PT    Recommendations for Other Services       Precautions / Restrictions Precautions Precautions: Fall Precaution Comments: monitor SpO2 Restrictions Weight Bearing Restrictions: No     Mobility  Bed Mobility Overal bed  mobility: Modified Independent             General bed mobility comments: pt up in recliner at start of session, back in bed at end of session    Transfers Overall transfer level: Needs assistance Equipment used: Rolling walker (2 wheels) Transfers: Sit to/from Stand Sit to Stand: Supervision                Ambulation/Gait Ambulation/Gait assistance: Min guard Gait Distance (Feet): 200 Feet Assistive device: Rolling walker (2 wheels)         General Gait Details: pt instructed in standing rest breaks 2-3 times during ambulation to allow spO2 readings, and to encourage activity modification   Stairs             Wheelchair Mobility    Modified Rankin (Stroke Patients Only)       Balance Overall balance assessment: Needs assistance Sitting-balance support: Feet supported Sitting balance-Leahy Scale: Good     Standing balance support: During functional activity Standing balance-Leahy Scale: Fair                              Cognition Arousal/Alertness: Awake/alert Behavior During Therapy: WFL for tasks assessed/performed Overall Cognitive Status: Within Functional Limits for tasks assessed                                          Exercises      General Comments        Pertinent Vitals/Pain Pain Assessment Pain Assessment: No/denies pain    Home Living  Prior Function            PT Goals (current goals can now be found in the care plan section) Progress towards PT goals: Progressing toward goals    Frequency    Min 2X/week      PT Plan Current plan remains appropriate    Co-evaluation              AM-PAC PT "6 Clicks" Mobility   Outcome Measure  Help needed turning from your back to your side while in a flat bed without using bedrails?: None Help needed moving from lying on your back to sitting on the side of a flat bed without using bedrails?:  None Help needed moving to and from a bed to a chair (including a wheelchair)?: None Help needed standing up from a chair using your arms (e.g., wheelchair or bedside chair)?: None Help needed to walk in hospital room?: A Little Help needed climbing 3-5 steps with a railing? : A Little 6 Click Score: 22    End of Session Equipment Utilized During Treatment: Oxygen (3L) Activity Tolerance: Patient limited by fatigue Patient left: with family/visitor present;in bed;with call bell/phone within reach Nurse Communication: Mobility status PT Visit Diagnosis: Other abnormalities of gait and mobility (R26.89)     Time: 8527-7824 PT Time Calculation (min) (ACUTE ONLY): 19 min  Charges:  $Therapeutic Activity: 8-22 mins                     Lieutenant Diego PT, DPT 1:57 PM,05/11/22

## 2022-05-11 NOTE — TOC Progression Note (Addendum)
Transition of Care [facility]) - Progression Note    Patient Details  Name: NKOSI CORTRIGHT MRN: [id number] Date of Birth: Dec 21, 1945  Transition of Care [facility]) CM/SW [place], [place] Phone Number: 05/11/2022, 10:25 AM  Clinical Narrative:     Patient active with [employer], Zach with [employer] processing NIV referral at this time, Patient's spouse Katharine Look updated.  TOC will follow for needs.   Leslie, [place]   Expected Discharge Plan: Home/Self Care Barriers to Discharge: Continued Medical Work up  Expected Discharge Plan and Services Expected Discharge Plan: Home/Self Care       Living arrangements for the past 2 months: Single Family Home                                       Social Determinants of Health (SDOH) Interventions    Readmission Risk Interventions     No data to display

## 2022-05-11 NOTE — Progress Notes (Signed)
Rounding Note    Patient Name: Kenneth Larsen Date of Encounter: 05/11/2022  [place] Cardiologist: Ida Rogue, MD   Subjective   Patient seen on a.m. rounds.  He denies any chest pain or shortness of breath.  He is -1.3 L output in the last 24 hours.  He is maintaining sats of 96% on 3 L via nasal cannula. Wife is at the bedside.  Inpatient Medications    Scheduled Meds:  aspirin EC  81 mg Oral Daily   docusate sodium  100 mg Oral BID   enoxaparin (LOVENOX) injection  40 mg Subcutaneous Q24H   feeding supplement  237 mL Oral BID BM   furosemide  40 mg Oral BID   guaiFENesin  600 mg Oral BID   levothyroxine  50 mcg Oral QAC breakfast   losartan  50 mg Oral Daily   magnesium citrate  1 Bottle Oral Once   metoprolol succinate  25 mg Oral Daily   multivitamin with minerals  1 tablet Oral Daily   polyethylene glycol  17 g Oral Daily   predniSONE  40 mg Oral Q breakfast   senna-docusate  1 tablet Oral BID   Continuous Infusions:  PRN Meds: acetaminophen **OR** acetaminophen, ipratropium-albuterol, ondansetron **OR** ondansetron (ZOFRAN) IV, mouth rinse   Vital Signs    Vitals:   05/11/22 0337 05/11/22 0500 05/11/22 0759 05/11/22 0825  BP: 108/69  (!) 108/44 (!) 106/43  Pulse: 70  76 71  Resp: 19   20  Temp: 98.8 F (37.1 C)  98.6 F (37 C) 97.7 F (36.5 C)  TempSrc: Axillary  Oral   SpO2: 97%  97% 96%  Weight:  96.9 kg    Height:        Intake/Output Summary (Last 24 hours) at 05/11/2022 0925 Last data filed at 05/11/2022 4098 Gross per 24 hour  Intake --  Output 1375 ml  Net -1375 ml      05/11/2022    5:00 AM 05/10/2022    5:12 AM 05/09/2022    5:35 AM  Last 3 Weights  Weight (lbs) 213 lb 10 oz 217 lb 6 oz 214 lb 11.7 oz  Weight (kg) 96.9 kg 98.6 kg 97.4 kg      Telemetry    Sinus rhythm with PACs, PVCs, rate 60-70- Personally Reviewed  ECG    No new tracings- Personally Reviewed  Physical Exam   GEN: No acute distress.    Neck: No JVD, no carotid bruits Cardiac: RRR, no murmurs, rubs, or gallops.  Respiratory: Diminished to auscultation bilaterally. Respirations are unlabored at rest on 3 L via nasal cannula GI: Soft, nontender, non-distended  MS: 1+ bilateral lower extremity edema; No deformity. Neuro:  Nonfocal  Psych: Normal affect   Labs    High Sensitivity Troponin:   Recent Labs  Lab 05/06/22 1746 05/06/22 2013  TROPONINIHS 31* 30*     Chemistry Recent Labs  Lab 05/06/22 1532 05/06/22 2012 05/09/22 0454 05/10/22 0434 05/11/22 0655  NA 129*   < > 137 135 136  K 3.8   < > 4.8 4.2 4.3  CL 90*   < > 86* 86* 87*  CO2 32   < > 41* 40* 42*  GLUCOSE 131*   < > 115* 107* 116*  BUN 12   < > 31* 45* 40*  CREATININE 0.80   < > 0.96 1.15 0.94  CALCIUM 8.4*   < > 9.1 9.0 8.7*  MG  --   --   --   --  2.3  PROT 6.4*  --   --   --   --   ALBUMIN 3.6  --   --   --   --   AST 15  --   --   --   --   ALT 10  --   --   --   --   ALKPHOS 59  --   --   --   --   BILITOT 1.2  --   --   --   --   GFRNONAA >60   < > >60 >60 >60  ANIONGAP 7   < > '10 9 7   '$ < > = values in this interval not displayed.    Lipids No results for input(s): "CHOL", "TRIG", "HDL", "LABVLDL", "LDLCALC", "CHOLHDL" in the last 168 hours.  Hematology Recent Labs  Lab 05/06/22 1532 05/06/22 2059 05/10/22 0434  WBC 7.1 6.6 7.9  RBC 5.03 4.73 4.66  HGB 15.2 14.3 13.9  HCT 47.2 44.7 45.6  MCV 93.8 94.5 97.9  MCH 30.2 30.2 29.8  MCHC 32.2 32.0 30.5  RDW 13.0 12.9 12.8  PLT 258 236 285   Thyroid  Recent Labs  Lab 05/06/22 2012  TSH 2.137    BNP Recent Labs  Lab 05/06/22 1532 05/09/22 0949  BNP 255.5* 55.2    DDimer No results for input(s): "DDIMER" in the last 168 hours.   Radiology    No results found.  Cardiac Studies  TTE 05/07/2022 1. Left ventricular ejection fraction, by estimation, is 55 to 60%. The  left ventricle has normal function. The left ventricle has no regional  wall motion abnormalities.  Left ventricular diastolic parameters are  consistent with Grade I diastolic  dysfunction (impaired relaxation).   2. Right ventricular systolic function is normal. The right ventricular  size is normal. There is normal pulmonary artery systolic pressure. The  estimated right ventricular systolic pressure is 95.2 mmHg.   3. The mitral valve is normal in structure. No evidence of mitral valve  regurgitation. No evidence of mitral stenosis.   4. The aortic valve has been repaired/replaced. s/p TAVR. Aortic valve  regurgitation is not visualized. No aortic stenosis is present. Aortic  valve area, by VTI measures 1.45 cm. Aortic valve mean gradient measures  5.2 mmHg. Aortic valve Vmax measures  1.56 m/s.   5. The inferior vena cava is normal in size with greater than 50%  respiratory variability, suggesting right atrial pressure of 3 mmHg.   Patient Profile     76 y.o. male with a past medical history of hypertension, COPD, tobacco history, chronic diastolic congestive heart failure, severe aortic stenosis status post TAVR in 11/11/2018 who, who is being seen and evaluated for heart failure exacerbation.  Assessment & Plan    Acute on chronic diastolic congestive heart failure -BNP 255 -LVEF 55-60% -continue lasix 40 mg BID -daily bmp while on diuretic therapy  -patient stated he was missing lasix doses at home - -1.3 L output in the last 24 hours -oxygen demand decreasing, weaned from bipap to 3L O2 via [place] -daily weight, I&O, low sodium diet  Acute respiratory failure with hypoxia and hypercapnia -multifactorial with CHF, COPD, medication non adherence -CTA chest negative for PE -Pulmonary following -continue IS use  -supportive care   Severe aortic stenosis status post TAVR in 2020 -echocardiogram reveals well functioning aortic valve -stable  Elevated high-sensitivity troponins -minimal elevation  -likely demand ischemia from fluid overload -continues to deny chest  pain -no ischemic changes noted on EKG or telemetry -LHC in 2020 revealed mild non-obstructive disease -continue asa, losartan, toprol  5.     Hypertension -blood pressure 106/43 -continue losartan and toprol XL -vitals per unit protocol   For questions or updates, please contact [place] Please consult [email or web address] for contact info under        Signed, Jawanza Zambito, NP  05/11/2022, 9:25 AM

## 2022-05-11 NOTE — Progress Notes (Signed)
PULMONOLOGY         Date: 05/11/2022,   MRN# [id number] Kenneth Larsen Jul 03, 1946     AdmissionWeight: 99.8 kg                 CurrentWeight: 96.9 kg  Referring provider: Dr Tawanna Solo   CHIEF COMPLAINT:   Acute on chronic hypoxemic respiratory failure   HISTORY OF PRESENT ILLNESS   76 yo M with hx of essential hypertension, hypothyroidism, lilfelong smoker with COPD and chronic hypoxemia on O2 in the past, hx of CHF with TAVR and preserved ejection fraction came in with chest discomfort and SOB associated with malaise and LE edema.  He was noted to be tachyarrythmic and dyspneic found to have hypoxemia requiring 5L/min [place].  He had blood work done in ER with mild elevation of BNP >250 mild hyponatremia with hypervolemic fluid status, mild hypertension, normal VBG on 5L O2, essentially normal CBC and viral workup was negative for covid, and flu.  He had CT chest which I reviewed with findings of bronchitic changes diffusely and bilaterally to moderate severity with absence of substantial pulmonary edema, pleural effusions, consolidated infiltrate negative for PE, no areas of frank honeycombing or pneumothorax. He was able to speak in full sentences and endorses chest discomfort unlike his normal dyspnea. He denies constitutional symptoms. PCCM consultation for further evaluation and management.   05/08/22- patient feels improved, his wife shares he had episode of resp distress overnight but she thinks this was due to O2 line kinking since patient was unable to appreciate any air flow through nasal canula.  We discussed being careful when moving around to NOT sit on or bend cordage for oxygen therapy.  He is cleared from pulmonary standpoint and is able to finish prednisone taper for bronchitis.  Please reduce prednisone by '5mg'$  daily.   05/09/22- patient seems more slow to answer and encephalopathic during interview today.  He was noted to be confused by wife and she communicated this  with RN.  An abg was done with findings of hypercapnic respiratory failure.  I have discussed this with family and ordered bipap for now.  He may need to be monitored in PCU.  Im stopping his lasix for now due to worsening hypercarbia with contraction alkalosis. Reducing prednisone to '40mg'$  from 50.  Will need to hold DC today    05/10/22 - patient is improved with lucid mentation.  We discussed needing BIPAP on outpatient,  I have submitted request to [employer] to assist patient acquiring medical equipment.   05/11/22- patient is s/p repeat ABG with normal pH now although chronically elevated CO2.  Patient seems to need NIV with BIPAP due to hypercapnic respiratory failure with hypoxemia.  Patient currently has [employer] for O2.    PAST MEDICAL HISTORY   Past Medical History:  Diagnosis Date   Arthritis    Cancer ([place])    2010-melanoma   Chronic diastolic (congestive) heart failure (HCC)    COPD (chronic obstructive pulmonary disease) ([place])    Hypertension    Hypothyroidism    Severe aortic stenosis      SURGICAL HISTORY   Past Surgical History:  Procedure Laterality Date   CARDIAC CATHETERIZATION     RIGHT/LEFT HEART CATH AND CORONARY ANGIOGRAPHY N/A 10/13/2018   Procedure: RIGHT/LEFT HEART CATH AND CORONARY ANGIOGRAPHY;  Surgeon: Nelva Bush, MD;  Location: [facility];  Service: Cardiovascular;  Laterality: N/A;   TEE WITHOUT CARDIOVERSION N/A 11/15/2018  Procedure: TRANSESOPHAGEAL ECHOCARDIOGRAM (TEE);  Surgeon: Burnell Blanks, MD;  Location: [facility];  Service: Open Heart Surgery;  Laterality: N/A;   TRANSCATHETER AORTIC VALVE REPLACEMENT, TRANSFEMORAL  11/15/2018   TRANSCATHETER AORTIC VALVE REPLACEMENT, TRANSFEMORAL N/A 11/15/2018   Procedure: TRANSCATHETER AORTIC VALVE REPLACEMENT, TRANSFEMORAL;  Surgeon: Burnell Blanks, MD;  Location: [facility];  Service: Open Heart Surgery;  Laterality: N/A;   VASECTOMY       FAMILY  HISTORY   Family History  Problem Relation Age of Onset   Cancer Father      SOCIAL HISTORY   Social History   Tobacco Use   Smoking status: Former    Packs/day: 1.00    Years: 25.00    Total pack years: 25.00    Types: Cigarettes   Smokeless tobacco: Never  Vaping Use   Vaping Use: Never used  Substance Use Topics   Alcohol use: Never   Drug use: Never     MEDICATIONS    Home Medication:    Current Medication:  Current Facility-Administered Medications:    acetaminophen (TYLENOL) tablet 650 mg, 650 mg, Oral, Q6H PRN **OR** acetaminophen (TYLENOL) suppository 650 mg, 650 mg, Rectal, Q6H PRN, Athena Masse, MD   aspirin EC tablet 81 mg, 81 mg, Oral, Daily, Judd Gaudier V, MD, 81 mg at 05/10/22 0859   docusate sodium (COLACE) capsule 100 mg, 100 mg, Oral, BID, Sharion Settler, NP, 100 mg at 05/10/22 0859   enoxaparin (LOVENOX) injection 40 mg, 40 mg, Subcutaneous, Q24H, Judd Gaudier V, MD, 40 mg at 05/10/22 2141   feeding supplement (ENSURE ENLIVE / ENSURE PLUS) liquid 237 mL, 237 mL, Oral, BID BM, Adhikari, Amrit, MD, 237 mL at 05/10/22 1400   furosemide (LASIX) tablet 40 mg, 40 mg, Oral, BID, Turner, Traci R, MD, 40 mg at 05/11/22 0756   guaiFENesin (MUCINEX) 12 hr tablet 600 mg, 600 mg, Oral, BID, Adhikari, Amrit, MD, 600 mg at 05/10/22 2141   ipratropium-albuterol (DUONEB) 0.5-2.5 (3) MG/3ML nebulizer solution 3 mL, 3 mL, Nebulization, Q4H PRN, Athena Masse, MD   levothyroxine (SYNTHROID) tablet 50 mcg, 50 mcg, Oral, QAC breakfast, Judd Gaudier V, MD, 50 mcg at 05/11/22 2130   losartan (COZAAR) tablet 50 mg, 50 mg, Oral, Daily, Gollan, Kathlene November, MD, 50 mg at 05/10/22 0900   magnesium citrate solution 1 Bottle, 1 Bottle, Oral, Once, Pahwani, Rinka R, MD   metoprolol succinate (TOPROL-XL) 24 hr tablet 25 mg, 25 mg, Oral, Daily, Gollan, Kathlene November, MD, 25 mg at 05/10/22 0900   multivitamin with minerals tablet 1 tablet, 1 tablet, Oral, Daily, Adhikari, Amrit,  MD, 1 tablet at 05/10/22 0859   ondansetron (ZOFRAN) tablet 4 mg, 4 mg, Oral, Q6H PRN **OR** ondansetron (ZOFRAN) injection 4 mg, 4 mg, Intravenous, Q6H PRN, Athena Masse, MD   Oral care mouth rinse, 15 mL, Mouth Rinse, PRN, Pahwani, Rinka R, MD   polyethylene glycol (MIRALAX / GLYCOLAX) packet 17 g, 17 g, Oral, Daily, Adhikari, Amrit, MD, 17 g at 05/10/22 0859   predniSONE (DELTASONE) tablet 40 mg, 40 mg, Oral, Q breakfast, Pahwani, Rinka R, MD, 40 mg at 05/11/22 0756   senna-docusate (Senokot-S) tablet 1 tablet, 1 tablet, Oral, BID, Adhikari, Amrit, MD, 1 tablet at 05/10/22 0900    ALLERGIES   Penicillins     REVIEW OF SYSTEMS    Review of Systems:  Gen:  Denies  fever, sweats, chills weigh loss  HEENT: Denies blurred vision, double vision,  ear pain, eye pain, hearing loss, nose bleeds, sore throat Cardiac:  No dizziness, chest pain or heaviness, chest tightness,edema Resp:   reports dyspnea chronically  Gi: Denies swallowing difficulty, stomach pain, nausea or vomiting, diarrhea, constipation, bowel incontinence Gu:  Denies bladder incontinence, burning urine Ext:   Denies Joint pain, stiffness or swelling Skin: Denies  skin rash, easy bruising or bleeding or hives Endoc:  Denies polyuria, polydipsia , polyphagia or weight change Psych:   Denies depression, insomnia or hallucinations   Other:  All other systems negative   VS: BP (!) 106/43 (BP Location: Right Arm)   Pulse 71   Temp 97.7 F (36.5 C)   Resp 20   Ht '6\' 1"'$  (1.854 m)   Wt 96.9 kg   SpO2 96%   BMI 28.18 kg/m      PHYSICAL EXAM    GENERAL:NAD, no fevers, chills, no weakness no fatigue HEAD: Normocephalic, atraumatic.  EYES: Pupils equal, round, reactive to light. Extraocular muscles intact. No scleral icterus.  MOUTH: Moist mucosal membrane. Dentition intact. No abscess noted.  EAR, NOSE, THROAT: Clear without exudates. No external lesions.  NECK: Supple. No thyromegaly. No nodules. No JVD.   PULMONARY: decreased breath sounds with mild rhonchi worse at bases bilaterally.  CARDIOVASCULAR: S1 and S2. Regular rate and rhythm. No murmurs, rubs, or gallops. No edema. Pedal pulses 2+ bilaterally.  GASTROINTESTINAL: Soft, nontender, nondistended. No masses. Positive bowel sounds. No hepatosplenomegaly.  MUSCULOSKELETAL: No swelling, clubbing, or edema. Range of motion full in all extremities.  NEUROLOGIC: Cranial nerves II through XII are intact. No gross focal neurological deficits. Sensation intact. Reflexes intact.  SKIN: No ulceration, lesions, rashes, or cyanosis. Skin warm and dry. Turgor intact.  PSYCHIATRIC: Mood, affect within normal limits. The patient is awake, alert and oriented x 3. Insight, judgment intact.       IMAGING   Reviewed CTPE performed in ER during this admission   ASSESSMENT/PLAN   Acute on chronic hypoxemic respiratory failure    - overall presentation seems to be more consistent with viral LRTI induced mild exacerbation of CHF    - I reviewed his CT chest there are bronchitic changes bilaterally , this will be treated with short course of steroids    - he has cardiology evaluation and TTE in progress and this will be good to optimize underlying cardiac dysfunction    -Patient has stable chronic COPD without excess mucus production or darkened inspissated expectoration of phlegm -will reduce prednisone to 40 -BIPAP for now and repeat abg in 4h -dc lasix due to contraction alkalosis and hypercarbia  Additional active comorbid conditions:     -Advanced COPD and chronic hypoxemia      -Acute on chronic HFpEF     -Metabolic syndrome     -s/p TAVR     -Hypothyroidism     - Advanced Age     - Deconditioning      - GI/DVT ppx      Thank you for allowing me to participate in the care of this patient.  Patient/Family are satisfied with care plan and all questions have been answered.    Provider disclosure: Patient with at least one acute or  chronic illness or injury that poses a threat to life or bodily function and is being managed actively during this encounter.  All of the below services have been performed independently by signing provider:  review of prior documentation from internal and or external health records.  Review of previous  and current lab results.  Interview and comprehensive assessment during patient visit today. Review of current and previous chest radiographs/CT scans. Discussion of management and test interpretation with health care team and patient/family.   This document was prepared using Dragon voice recognition software and may include unintentional dictation errors.     Ottie Glazier, M.D.  Division of Pulmonary & Critical Care Medicine

## 2022-05-11 NOTE — Progress Notes (Signed)
Occupational Therapy Treatment Patient Details Name: Kenneth Larsen MRN: [id number] DOB: [birth year] Today's Date: 05/11/2022   History of present illness Pt is a 76 year old male with history of hypertension, hypothyroidism, previous smoker, COPD, diastolic CHF, aortic stenosis status post TAVR who presented with 3-day history of shortness of breath, weakness, mild lower extremity edema.   OT comments  Mr. Kenneth Larsen reports feeling better today, down to 3L O2 with SpO2 remaining at 94% or greater throughout session. Good sitting and standing balance, good safety awareness. Provided educ re: energy conservation strategies, home modifications, safety, falls prevention, health promotion. Pt and wife endorsing understanding. Recommend DC home with HHOT to reduce falls risk and support pt's return to PLOF.   Recommendations for follow up therapy are one component of a multi-disciplinary discharge planning process, led by the attending physician.  Recommendations may be updated based on patient status, additional functional criteria and insurance authorization.    Follow Up Recommendations  Home health OT    Assistance Recommended at Discharge PRN  Patient can return home with the following  A little help with walking and/or transfers;Assistance with cooking/housework;A little help with bathing/dressing/bathroom   Equipment Recommendations  None recommended by OT    Recommendations for Other Services      Precautions / Restrictions Precautions Precautions: Fall Precaution Comments: monitor SpO2 Restrictions Weight Bearing Restrictions: No       Mobility Bed Mobility Overal bed mobility: Modified Independent                  Transfers Overall transfer level: Needs assistance Equipment used: Rolling walker (2 wheels) Transfers: Sit to/from Stand Sit to Stand: Supervision           General transfer comment: increased time/effort     Balance Overall balance assessment:  Needs assistance Sitting-balance support: Feet supported Sitting balance-Leahy Scale: Good     Standing balance support: During functional activity Standing balance-Leahy Scale: Good                             ADL either performed or assessed with clinical judgement   ADL                                         General ADL Comments: SBA-SUPV for safety with RW    Extremity/Trunk Assessment Upper Extremity Assessment Upper Extremity Assessment: Overall WFL for tasks assessed   Lower Extremity Assessment Lower Extremity Assessment: Overall WFL for tasks assessed        Vision       Perception     Praxis      Cognition Arousal/Alertness: Awake/alert Behavior During Therapy: WFL for tasks assessed/performed Overall Cognitive Status: Within Functional Limits for tasks assessed                                          Exercises Other Exercises Other Exercises: Educ re: energy conservation strategies, falls prevention, home modifications, safety, nutrition, transport considerations    Shoulder Instructions       General Comments      Pertinent Vitals/ Pain       Pain Assessment Pain Assessment: No/denies pain  Home Living  Prior Functioning/Environment              Frequency  Min 2X/week        Progress Toward Goals  OT Goals(current goals can now be found in the care plan section)  Progress towards OT goals: Progressing toward goals  Acute Rehab OT Goals OT Goal Formulation: With patient/family Time For Goal Achievement: 05/21/22 Potential to Achieve Goals: Good  Plan Discharge plan remains appropriate;Frequency remains appropriate    Co-evaluation                 AM-PAC OT "6 Clicks" Daily Activity     Outcome Measure   Help from another person eating meals?: None Help from another person taking care of personal grooming?:  None Help from another person toileting, which includes using toliet, bedpan, or urinal?: A Little Help from another person bathing (including washing, rinsing, drying)?: A Little Help from another person to put on and taking off regular upper body clothing?: None Help from another person to put on and taking off regular lower body clothing?: A Little 6 Click Score: 21    End of Session Equipment Utilized During Treatment: Rolling walker (2 wheels)  OT Visit Diagnosis: Muscle weakness (generalized) (M62.81);Other abnormalities of gait and mobility (R26.89)   Activity Tolerance Patient tolerated treatment well   Patient Left in bed;with call bell/phone within reach;with family/visitor present;with nursing/sitter in room   Nurse Communication          Time: 3474-2595 OT Time Calculation (min): 31 min  Charges: OT General Charges $OT Visit: 1 Visit OT Treatments $Self Care/Home Management : 23-37 mins Kenneth Lobo, PhD, MS, OTR/L 05/11/22, 2:41 PM

## 2022-05-11 NOTE — Hospital Course (Addendum)
Kenneth Larsen was admitted to the hospital with the working diagnosis of decompensated COPD and heart failure complicated with acute on chronic respiratory failure.   76 yo male with the past medical history of hypertension, hypothyroidism, COPD, sp TAVR in 2022 who presented with dyspnea. Reported 3 days of dyspnea and lower extremity edema. On his initial physical examination his blood pressure 164/85, HR 86-107, RR 40 and 02 saturation 85%, heart with S1 and S2 present and rhythmic, lungs with no wheezing, abdomen with no distention, and positive lower extremity edema.   NA 129, K 3,8 Cl 90, bicarbonate 23, glucose 131, bun 12 cr 0.80  BNP 255  Wbc 7,1 hgb 15,2 plt 258  VBG 7,35/69/78/38.   Chest radiograph with cardiomegaly, enlarged pulmonary arteries. Left hemidiaphragm elevation.  CT chest with no pulmonary embolism. Positive bilateral ground glass opacities.   EKG 93 bpm, normal axis, normal intervals, sinus rhythm with pac and pvc, with no significant ST segment or T wave changes, poor R to R wave progression.   Patient was placed on Bipap for respiratory failure. Bronchodilator therapy and systemic corticosteroids.   Received furosemide for diuresis.

## 2022-05-11 NOTE — Care Management Important Message (Signed)
Important Message  Patient Details  Name: Kenneth Larsen MRN: [id number] Date of Birth: Apr 13, 1946   Medicare Important Message Given:  Yes     Dannette Barbara 05/11/2022, 12:02 PM

## 2022-05-11 NOTE — Progress Notes (Signed)
Progress Note   Patient: Kenneth Larsen JKD:[id number] DOB: 13-Oct-1945 DOA: 05/06/2022     4 DOS: the patient was seen and examined on 05/11/2022   Brief hospital course: Mr. Kenneth Larsen was admitted to the hospital with the working diagnosis of decompensated COPD and heart failure complicated with acute on chronic respiratory failure.   76 yo male with the past medical history of hypertension, hypothyroidism, COPD, sp TAVR in 2022 who presented with dyspnea. Reported 3 days of dyspnea and lower extremity edema. On his initial physical examination his blood pressure 164/85, HR 86-107, RR 40 and 02 saturation 85%, heart with S1 and S2 present and rhythmic, lungs with no wheezing, abdomen with no distention, and positive lower extremity edema.   NA 129, K 3,8 Cl 90, bicarbonate 23, glucose 131, bun 12 cr 0.80  BNP 255  Wbc 7,1 hgb 15,2 plt 258  VBG 7,35/69/78/38.   Chest radiograph with cardiomegaly, enlarged pulmonary arteries. Left hemidiaphragm elevation.  CT chest with no pulmonary embolism. Positive bilateral ground glass opacities.   EKG 93 bpm, normal axis, normal intervals, sinus rhythm with pac and pvc, with no significant ST segment or T wave changes, poor R to R wave progression.   Patient was placed on Bipap for respiratory failure. Bronchodilator therapy and systemic corticosteroids.   Received furosemide for diuresis.   Assessment and Plan: * Acute on chronic respiratory failure with hypoxia and hypercapnia (HCC) Patient with improvement in dyspnea Follow up blood gas from 09/16 with improvement in gas exchange.  Likely multifactorial respiratory failure COPD and acute pulmonary edema.   Plan to continue non invasive mechanical ventilation,   Mr. Kenneth Larsen presents with acute on chronic hypoxic and hypercapnic respiratory failure due to COPD.  The use of the NIV will treat patient's high PC02 levels (80 with an elevated bicarb of 48.4 on 05/09/22).  Therefore, NIV use can reduce  risk of exacerbations and future hospitalizations when used at night and during the day.  All alternate devices [id number] and [id number]) have been considered and ruled out as volume requirements are not met by BiLevel devices.  An NIV with volume-targeted pressure support is necessary to prevent patient from life-threatening harm.  Interruption or failure to provide NIV would quickly lead to exacerbation of the patient's condition, hospital re-admission, and likely harm to the patient. Continued use is preferred.  Patient is able to protect their airways and clear secretions on their own.  Elevated troponin Troponin 31 but without chest pain or  ischemic EKG changes Likely demand ischemia from increased work of breathing Continue to trend Continue aspirin Cardiology has been consulted.  Acute on chronic diastolic CHF (congestive heart failure) (HCC) Echocardiogram with preserved LV systolic function EF 55 to 60%. RV systolic function is preserved. Aortic valve sp TAVR. No significant valvular disease.   Urine output documented is 3,825 ml Systolic blood pressure is 106 mmHg.   Plan to continue diuresis with furosemide 40 mg bid Losartan 25 mg Metoprolol 25 mg  Add SGLT 2 inh.    COPD with mild acute exacerbation (HCC) Improving gas exchange. Dyspnea continue to improve  Plan to continue with bronchodilator therapy.  Dc systemic corticosteroid and add inhaled steroids.  Out of bed to chair tid with meals Continue with PT, OT and airway clearing techniques with flutter valve and incentive spirometer.    Hyponatremia Renal function stable with serum cr at 0.94 with K at 4,3 and serum bicarbonate at 42.  Na 132.  Mg 2.3  Plan to continue diuresis and follow up renal function in am, Avoid hypotension and nephrotoxic medications.   Hypertension Continue diuresis with furosemide, add SGLT 2 inh Continue with losartan and metoprolol.   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) No acute issues suspected but follow-up valve function on echocardiogram        Subjective: Patient with improvement in his dyspnea and edema but not yet back to baseline   Physical Exam: Vitals:   05/11/22 0337 05/11/22 0500 05/11/22 0759 05/11/22 0825  BP: 108/69  (!) 108/44 (!) 106/43  Pulse: 70  76 71  Resp: 19   20  Temp: 98.8 F (37.1 C)  98.6 F (37 C) 97.7 F (36.5 C)  TempSrc: Axillary  Oral   SpO2: 97%  97% 96%  Weight:  96.9 kg    Height:       Neurology awake and alert ENT with no pallor Cardiovascular with S1 and S2 present and rhythmic with no gallops, rubs or murmurs Respiratory with no wheezing, scattered rales at bases with no wheezing Abdomen with no distention Lower extremity with trace non pitting edema  Data Reviewed:    Family Communication: I spoke with patient's wife at the bedside, we talked in detail about patient's condition, plan of care and prognosis and all questions were addressed.   Disposition: Status is: Inpatient Remains inpatient appropriate because: respiratory failure, possible discharge tomorrow with home NIV  Planned Discharge Destination: Home      Author: Tawni Millers, MD 05/11/2022 2:13 PM  For on call review [email or web address].

## 2022-05-11 NOTE — Progress Notes (Signed)
    Durable Medical Equipment  (From admission, onward)           Start     Ordered   05/11/22 1416  For home use only DME Bedside commode  Once       Question:  Patient needs a bedside commode to treat with the following condition  Answer:  Ambulatory dysfunction   05/11/22 1416   05/11/22 1359  For home use only DME 3 n 1  Once        05/11/22 1358   05/11/22 1326  For home use only DME Other see comment  Once       Comments: Please evaluate for POC  Question:  Length of Need  Answer:  6 Months   05/11/22 Sibley, [place]

## 2022-05-11 NOTE — Progress Notes (Signed)
Pt's critical CO2 level called to Dr. Cathlean Sauer as soon as I got the call from lab.

## 2022-05-11 NOTE — Progress Notes (Signed)
Mobility Specialist - Progress Note   05/11/22 0923  Mobility  Activity Ambulated with assistance in hallway;Stood at bedside;Dangled on edge of bed  Level of Assistance Standby assist, set-up cues, supervision of patient - no hands on  Distance Ambulated (ft) 200 ft  Activity Response Tolerated well  $Mobility charge 1 Mobility    Pre-mobility: SpO2 = 94 Post-mobility: SPO2 = 94  Pt OOB on 3L upon arrival. Pt ambulates 1 lap around NS SBA, no LOB. Pt returns to EOB with needs in reach and family in room.   Gretchen Short  Mobility Specialist  05/11/22 9:26 AM

## 2022-05-11 NOTE — Assessment & Plan Note (Signed)
Calculated BMI is 28,1

## 2022-05-11 NOTE — Assessment & Plan Note (Signed)
Patient with improvement in dyspnea Follow up blood gas from 09/16 with improvement in gas exchange.  Likely multifactorial respiratory failure COPD and acute pulmonary edema.   Plan to continue non invasive mechanical ventilation,   Mr. Paff presents with acute on chronic hypoxic and hypercapnic respiratory failure due to COPD.  The use of the NIV will treat patient's high PC02 levels (80 with an elevated bicarb of 48.4 on 05/09/22).  Therefore, NIV use can reduce risk of exacerbations and future hospitalizations when used at night and during the day.  All alternate devices [phone] and [id number]) have been considered and ruled out as volume requirements are not met by BiLevel devices.  An NIV with volume-targeted pressure support is necessary to prevent patient from life-threatening harm.  Interruption or failure to provide NIV would quickly lead to exacerbation of the patient's condition, hospital re-admission, and likely harm to the patient. Continued use is preferred.  Patient is able to protect their airways and clear secretions on their own.

## 2022-05-11 NOTE — Assessment & Plan Note (Signed)
Echocardiogram with preserved LV systolic function EF 55 to 60%. RV systolic function is preserved. Aortic valve sp TAVR. No significant valvular disease.  Net IO Since Admission: -3,215 mL [05/14/22 1633]  Treated with IV Lasix 40 mg BID. Discharge on PO Lasix 40 mg daiy. Losartan 25 mg Metoprolol 25 mg  Added SGLT 2 inh.

## 2022-05-12 DIAGNOSIS — J9621 Acute and chronic respiratory failure with hypoxia: Secondary | ICD-10-CM | POA: Diagnosis not present

## 2022-05-12 DIAGNOSIS — J9622 Acute and chronic respiratory failure with hypercapnia: Secondary | ICD-10-CM | POA: Diagnosis not present

## 2022-05-12 DIAGNOSIS — I5033 Acute on chronic diastolic (congestive) heart failure: Secondary | ICD-10-CM | POA: Diagnosis not present

## 2022-05-12 LAB — BASIC METABOLIC PANEL
Anion gap: 9 (ref 5–15)
BUN: 30 mg/dL — ABNORMAL HIGH (ref 8–23)
CO2: 42 mmol/L — ABNORMAL HIGH (ref 22–32)
Calcium: 8.6 mg/dL — ABNORMAL LOW (ref 8.9–10.3)
Chloride: 85 mmol/L — ABNORMAL LOW (ref 98–111)
Creatinine, Ser: 0.86 mg/dL (ref 0.61–1.24)
GFR, Estimated: >60 mL/min (ref >60)
Glucose, Bld: 116 mg/dL — ABNORMAL HIGH (ref 70–99)
Potassium: 3.9 mmol/L (ref 3.5–5.1)
Sodium: 136 mmol/L (ref 135–145)

## 2022-05-12 MED ORDER — METOPROLOL SUCCINATE ER 25 MG PO TB24: 25.0000 mg | ORAL_TABLET | Freq: Every day | ORAL | 1 refills | Status: DC

## 2022-05-12 MED ORDER — GUAIFENESIN ER 600 MG PO TB12: 600.0000 mg | ORAL_TABLET | Freq: Two times a day (BID) | ORAL | 0 refills | Status: DC | PRN

## 2022-05-12 MED ORDER — DAPAGLIFLOZIN PROPANEDIOL 10 MG PO TABS: 10.0000 mg | ORAL_TABLET | Freq: Every day | ORAL | 1 refills | Status: DC

## 2022-05-12 MED ORDER — LOSARTAN POTASSIUM 50 MG PO TABS: 50.0000 mg | ORAL_TABLET | Freq: Every day | ORAL | 1 refills | Status: DC

## 2022-05-12 MED ORDER — FUROSEMIDE 40 MG PO TABS: 40.0000 mg | ORAL_TABLET | Freq: Every day | ORAL | Status: DC

## 2022-05-12 MED ORDER — ADULT MULTIVITAMIN W/MINERALS CH: 1.0000 | ORAL_TABLET | Freq: Every day | ORAL | Status: DC

## 2022-05-12 MED ORDER — TIOTROPIUM BROMIDE MONOHYDRATE 18 MCG IN CAPS: 18.0000 ug | ORAL_CAPSULE | Freq: Every day | RESPIRATORY_TRACT | 1 refills | Status: DC

## 2022-05-12 MED ORDER — ALBUTEROL SULFATE HFA 108 (90 BASE) MCG/ACT IN AERS: 2.0000 | INHALATION_SPRAY | Freq: Four times a day (QID) | RESPIRATORY_TRACT | 2 refills | Status: DC | PRN

## 2022-05-12 MED ORDER — DOCUSATE SODIUM 100 MG PO CAPS: 100.0000 mg | ORAL_CAPSULE | Freq: Two times a day (BID) | ORAL | 0 refills | Status: DC | PRN

## 2022-05-12 MED ORDER — SENNOSIDES-DOCUSATE SODIUM 8.6-50 MG PO TABS: 1.0000 | ORAL_TABLET | Freq: Every evening | ORAL | Status: DC | PRN

## 2022-05-12 MED ORDER — ENSURE ENLIVE PO LIQD: 237.0000 mL | Freq: Two times a day (BID) | ORAL | 12 refills | Status: DC

## 2022-05-12 MED ORDER — EMPAGLIFLOZIN 10 MG PO TABS: 10.0000 mg | ORAL_TABLET | Freq: Every day | ORAL | 1 refills | Status: DC

## 2022-05-12 MED ORDER — MOMETASONE FURO-FORMOTEROL FUM 200-5 MCG/ACT IN AERO: 2.0000 | INHALATION_SPRAY | Freq: Two times a day (BID) | RESPIRATORY_TRACT | 1 refills | Status: DC

## 2022-05-12 NOTE — TOC Transition Note (Addendum)
Transition of Care [facility]) - CM/SW Discharge Note   Patient Details  Name: Kenneth Larsen MRN: [id number] Date of Birth: [birth year]  Transition of Care [facility] CM/SW Contact:  Alberteen Sam, LCSW Phone Number: 05/12/2022, 11:10 AM   Clinical Narrative:     Patient to dc home today, has declined [place] services. Has O2 through [employer] at home, patient is also set up with NIV trilogy to be delivered to home today via [employer]. Patient has 3in1 and RW delivered by [employer] to bedside today.   No further dc needs.    Final next level of care: Home/Self Care Barriers to Discharge: No Barriers Identified   Patient Goals and CMS Choice Patient states their goals for this hospitalization and ongoing recovery are:: to go home CMS Medicare.gov Compare Post Acute Care list provided to:: Patient Choice offered to / list presented to : Patient  Discharge Placement                       Discharge Plan and Services                DME Arranged: 3-N-1, [place] rolling DME Agency: [employer] Date DME Agency Contacted: 05/12/22 Time DME Agency Contacted: 1110 Representative spoke with at DME Agency: [place] ([place]) Interventions     Readmission Risk Interventions     No data to display

## 2022-05-12 NOTE — Discharge Summary (Addendum)
Physician Discharge Summary   Patient: Kenneth Larsen MRN: [id number] DOB: 06-08-1946  Admit date:     05/06/2022  Discharge date: 05/14/22  Discharge Physician: Ezekiel Slocumb   PCP: [facility]   Recommendations at discharge:   Follow up with Primary Care in 1-2 weeks Follow up with Pulmonology Follow up with Cardiology Repeat BMP, CBC, Mg in 1-2 weeks Follow up on tolerance for NIV and compliance, efficacy Follow up on Blood pressure given started on multiple therapies for CHF  Discharge Diagnoses: Principal Problem:   Acute on chronic respiratory failure with hypoxia and hypercapnia (HCC) Active Problems:   COPD with mild acute exacerbation (HCC)   Acute on chronic diastolic CHF (congestive heart failure) (HCC)   Hypertension   Hypothyroidism   Class 1 obesity  Resolved Problems:   Hyponatremia  Hospital Course: Mr. Pitstick was admitted to the hospital with the working diagnosis of decompensated COPD and heart failure complicated with acute on chronic respiratory failure.   76 yo male with the past medical history of hypertension, hypothyroidism, COPD, sp TAVR in 2022 who presented with dyspnea. Reported 3 days of dyspnea and lower extremity edema. On his initial physical examination his blood pressure 164/85, HR 86-107, RR 40 and 02 saturation 85%, heart with S1 and S2 present and rhythmic, lungs with no wheezing, abdomen with no distention, and positive lower extremity edema.   NA 129, K 3,8 Cl 90, bicarbonate 23, glucose 131, bun 12 cr 0.80  BNP 255  Wbc 7,1 hgb 15,2 plt 258  VBG 7,35/69/78/38.   Chest radiograph with cardiomegaly, enlarged pulmonary arteries. Left hemidiaphragm elevation.  CT chest with no pulmonary embolism. Positive bilateral ground glass opacities.   EKG 93 bpm, normal axis, normal intervals, sinus rhythm with pac and pvc, with no significant ST segment or T wave changes, poor R to R wave progression.   Patient was placed on  Bipap for respiratory failure. Bronchodilator therapy and systemic corticosteroids.   Received furosemide for diuresis.   Assessment and Plan: * Acute on chronic respiratory failure with hypoxia and hypercapnia (HCC) Patient with improvement in dyspnea Follow up blood gas from 09/16 with improvement in gas exchange.  Likely multifactorial respiratory failure COPD and acute pulmonary edema.   Plan to continue non invasive mechanical ventilation,   Mr. Youman presents with acute on chronic hypoxic and hypercapnic respiratory failure due to COPD.  The use of the NIV will treat patient's high PC02 levels (80 with an elevated bicarb of 48.4 on 05/09/22).  Therefore, NIV use can reduce risk of exacerbations and future hospitalizations when used at night and during the day.  All alternate devices [phone] and [id number]) have been considered and ruled out as volume requirements are not met by BiLevel devices.  An NIV with volume-targeted pressure support is necessary to prevent patient from life-threatening harm.  Interruption or failure to provide NIV would quickly lead to exacerbation of the patient's condition, hospital re-admission, and likely harm to the patient. Continued use is preferred.  Patient is able to protect their airways and clear secretions on their own.  Elevated troponin Troponin 31 but without chest pain or  ischemic EKG changes Likely demand ischemia from increased work of breathing Continue to trend Continue aspirin Cardiology has been consulted.  Acute on chronic diastolic CHF (congestive heart failure) (HCC) Echocardiogram with preserved LV systolic function EF 55 to 60%. RV systolic function is preserved. Aortic valve sp TAVR. No significant valvular disease.  Net IO  Since Admission: -3,215 mL [05/14/22 1633]  Treated with IV Lasix 40 mg BID. Discharge on PO Lasix 40 mg daiy. Losartan 25 mg Metoprolol 25 mg  Added SGLT 2 inh.    COPD with mild acute exacerbation  (HCC) Improving gas exchange. Tolerating NIV overnight. Dyspnea improved. Treated with bronchodilators, systemic corticosteroid, add inhaled steroids.  Continue PT, OT and pulmonary hygiene with flutter valve and incentive spirometer.  Follow up with Pulmonology.  Hyponatremia-resolved as of 05/14/2022 Resolved with diuresis. Was likely hypervolemic. Repeat BMP at follow up.  Hypertension Continue diuresis with furosemide, add SGLT 2 inh Continue with losartan and metoprolol.   Hypothyroidism Continue levothyroxine  Class 1 obesity Body mass index is 28.18 kg/m. Currently does NOT meet definition of obesity. Overweight Complicates overall care and prognosis.  Recommend lifestyle modifications including physical activity and diet for weight loss and overall long-term health.   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 Improved with BiPAP/NIV.      Consultants: Cardiology, Pulmonology Procedures performed: Echo  Disposition: Home health Diet recommendation:  Discharge Diet Orders (From admission, onward)     Start     Ordered   05/12/22 0000  Diet - low sodium heart healthy        05/12/22 1129           Cardiac and Carb modified diet DISCHARGE MEDICATION: Allergies as of 05/12/2022       Reactions   Penicillins Hives   Did it involve swelling of the face/tongue/throat, SOB, or low BP? No Did it involve sudden or severe rash/hives, skin peeling, or any reaction on the inside of your mouth or nose? No Did you need to seek medical attention at a hospital or doctor's office? No When did it last happen?      childhood allergy If all above answers are "NO", may proceed with cephalosporin use.        Medication List     TAKE these medications    albuterol 108 (90 Base) MCG/ACT  inhaler Commonly known as: VENTOLIN HFA Inhale 2 puffs into the lungs every 6 (six) hours as needed for wheezing or shortness of breath.   aspirin EC 81 MG tablet Take 1 tablet (81 mg total) by mouth daily.   cyanocobalamin 1000 MCG tablet Commonly known as: VITAMIN B12 Take 1,000 mcg by mouth daily.   dapagliflozin propanediol 10 MG Tabs tablet Commonly known as: Farxiga Take 1 tablet (10 mg total) by mouth daily before breakfast.   docusate sodium 100 MG capsule Commonly known as: COLACE Take 1 capsule (100 mg total) by mouth 2 (two) times daily as needed for mild constipation.   feeding supplement Liqd Take 237 mLs by mouth 2 (two) times daily between meals.   furosemide 40 MG tablet Commonly known as: Lasix Take 1 tablet (40 mg total) by mouth daily.   guaiFENesin 600 MG 12 hr tablet Commonly known as: MUCINEX Take 1 tablet (600 mg total) by mouth 2 (two) times daily as needed.   levothyroxine 50 MCG tablet Commonly known as: SYNTHROID Take 50 mcg by mouth daily before breakfast.   losartan 50 MG tablet Commonly known as: COZAAR Take 1 tablet (50 mg total) by mouth daily.   metoprolol succinate 25 MG 24 hr tablet Commonly known as: TOPROL-XL Take 1 tablet (25 mg total) by mouth daily.   mometasone-formoterol  200-5 MCG/ACT Aero Commonly known as: DULERA Inhale 2 puffs into the lungs 2 (two) times daily.   multivitamin with minerals Tabs tablet Take 1 tablet by mouth daily.   senna-docusate 8.6-50 MG tablet Commonly known as: Senokot-S Take 1 tablet by mouth at bedtime as needed for moderate constipation.   tiotropium 18 MCG inhalation capsule Commonly known as: Spiriva HandiHaler Place 1 capsule (18 mcg total) into inhaler and inhale daily.   Vitamin D-1000 Max St 25 MCG (1000 UT) tablet Generic drug: Cholecalciferol Take 1,000 Units by mouth daily.        Follow-up [place], [facility]. Go in 1 week(s).   Specialty:  General Practice Why: Appointment on Tuesday, 05/19/2022 at 3:40pm. Contact information: [place] [address] [phone]         Minna Merritts, MD. Go in 1 week(s).   Specialty: Cardiology Why: Appointment on Monday, 06/08/2022 3:00pm. You have been added to a waitlist for an earlier appointment. Contact information: [place] [address] [phone]         Ottie Glazier, MD. Call.   Specialty: Pulmonary Disease Why: Follow up as scheduled, call if needed to confirm appointment date/time. Contact information: [place] [address] [phone]                Discharge Exam: Danley Danker Weights   05/09/22 0535 05/10/22 0512 05/11/22 0500  Weight: 97.4 kg 98.6 kg 96.9 kg   General exam: awake, alert, no acute distress HEENT: atraumatic, clear conjunctiva, anicteric sclera, moist mucus membranes, hearing grossly normal  Respiratory system: CTAB, no wheezes, rales or rhonchi, normal respiratory effort. Cardiovascular system: normal S1/S2, RRR, no pedal edema.   Gastrointestinal system: soft, NT, ND, no HSM felt, +bowel sounds. Central nervous system: A&O x3. no gross focal neurologic deficits, normal speech Extremities: moves all, no edema, normal tone Skin: dry, intact, normal temperature Psychiatry: normal mood, congruent affect, judgement and insight appear normal   Condition at discharge: stable  The results of significant diagnostics from this hospitalization (including imaging, microbiology, ancillary and laboratory) are listed below for reference.   Imaging Studies: DG Chest Port 1 View  Result Date: 05/09/2022 CLINICAL DATA:  Bronchitis. EXAM: PORTABLE CHEST 1 VIEW COMPARISON:  Chest radiograph dated May 06, 2022 FINDINGS: The heart size and mediastinal contours are within normal limits. Atherosclerotic calcification of the aortic arch. Trans aortic valve replacement.  Elevation of the left hemidiaphragm with left basilar atelectasis. No focal consolidation or large pleural effusion. 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 IMPRESSION: No focal consolidation or large pleural effusion. Left basilar atelectasis with elevation of the left hemidiaphragm. Electronically Signed   By: Keane Police D.O.   On: 05/09/2022 09:46   ECHOCARDIOGRAM COMPLETE  Result Date: 05/07/2022    ECHOCARDIOGRAM REPORT   Patient Name:   KHASIR WOODROME Date of Exam: 05/07/2022 Medical Rec #:  [id number]      Height:       73.0 in Accession #:    [id number]     Weight:       212.3 lb Date of Birth:  June 11, 1946      BSA:          2.207 m Patient Age:    76 years       BP:           140/70 mmHg Patient Gender: M  HR:           78 bpm. Exam Location:  [facility] Procedure: 2D Echo, Color Doppler and Cardiac Doppler Indications:     CHF- acute diastolic K24.46  History:         Patient has prior history of Echocardiogram examinations, most                  recent 06/12/2021. COPD; Risk Factors:Hypertension. Severe                  aortic stenosis, TAVR.  Sonographer:     Sherrie Sport Referring Phys:  Athena Masse Diagnosing Phys: Ida Rogue MD  Sonographer Comments: Technically challenging study due to limited acoustic windows and no parasternal window. Image acquisition challenging due to COPD. IMPRESSIONS  1. Left ventricular ejection fraction, by estimation, is 55 to 60%. The left ventricle has normal function. The left ventricle has no regional wall motion abnormalities. Left ventricular diastolic parameters are consistent with Grade I diastolic dysfunction (impaired relaxation).  2. Right ventricular systolic function is normal. The right ventricular size is normal. There is normal pulmonary artery systolic pressure. The estimated right ventricular systolic pressure is 95.0 mmHg.  3. The mitral valve is normal in structure. No evidence of mitral valve regurgitation. No  evidence of mitral stenosis.  4. The aortic valve has been repaired/replaced. s/p TAVR. Aortic valve regurgitation is not visualized. No aortic stenosis is present. Aortic valve area, by VTI measures 1.45 cm. Aortic valve mean gradient measures 5.2 mmHg. Aortic valve Vmax measures 1.56 m/s.  5. The inferior vena cava is normal in size with greater than 50% respiratory variability, suggesting right atrial pressure of 3 mmHg. FINDINGS  Left Ventricle: Left ventricular ejection fraction, by estimation, is 55 to 60%. The left ventricle has normal function. The left ventricle has no regional wall motion abnormalities. The left ventricular internal cavity size was normal in size. There is  no left ventricular hypertrophy. Left ventricular diastolic parameters are consistent with Grade I diastolic dysfunction (impaired relaxation). Right Ventricle: The right ventricular size is normal. No increase in right ventricular wall thickness. Right ventricular systolic function is normal. There is normal pulmonary artery systolic pressure. The tricuspid regurgitant velocity is 2.44 m/s, and  with an assumed right atrial pressure of 5 mmHg, the estimated right ventricular systolic pressure is 72.2 mmHg. Left Atrium: Left atrial size was normal in size. Right Atrium: Right atrial size was normal in size. Pericardium: There is no evidence of pericardial effusion. Mitral Valve: The mitral valve is normal in structure. No evidence of mitral valve regurgitation. No evidence of mitral valve stenosis. Tricuspid Valve: The tricuspid valve is normal in structure. Tricuspid valve regurgitation is mild . No evidence of tricuspid stenosis. Aortic Valve: The aortic valve has been repaired/replaced. Aortic valve regurgitation is not visualized. No aortic stenosis is present. Aortic valve mean gradient measures 5.2 mmHg. Aortic valve peak gradient measures 9.7 mmHg. Aortic valve area, by VTI measures 1.45 cm. Pulmonic Valve: The pulmonic valve  was normal in structure. Pulmonic valve regurgitation is not visualized. No evidence of pulmonic stenosis. Aorta: The aortic root is normal in size and structure. Venous: The inferior vena cava is normal in size with greater than 50% respiratory variability, suggesting right atrial pressure of 3 mmHg. IAS/Shunts: No atrial level shunt detected by color flow Doppler.  LEFT VENTRICLE PLAX 2D LVIDd:         3.60 cm   Diastology LVIDs:  2.60 cm   LV e' medial:    5.66 cm/s LV PW:         1.10 cm   LV E/e' medial:  13.1 LV IVS:        1.40 cm   LV e' lateral:   6.53 cm/s LVOT diam:     2.00 cm   LV E/e' lateral: 11.3 LV SV:         41 LV SV Index:   19 LVOT Area:     3.14 cm  RIGHT VENTRICLE RV Basal diam:  3.20 cm RV Mid diam:    3.00 cm RV S prime:     12.30 cm/s TAPSE (M-mode): 2.2 cm LEFT ATRIUM             Index        RIGHT ATRIUM           Index LA diam:        3.40 cm 1.54 cm/m   RA Area:     11.80 cm LA Vol (A2C):   48.8 ml 22.11 ml/m  RA Volume:   22.40 ml  10.15 ml/m LA Vol (A4C):   58.8 ml 26.64 ml/m LA Biplane Vol: 53.5 ml 24.24 ml/m  AORTIC VALVE AV Area (Vmax):    1.34 cm AV Area (Vmean):   1.34 cm AV Area (VTI):     1.45 cm AV Vmax:           155.80 cm/s AV Vmean:          102.300 cm/s AV VTI:            0.286 m AV Peak Grad:      9.7 mmHg AV Mean Grad:      5.2 mmHg LVOT Vmax:         66.60 cm/s LVOT Vmean:        43.500 cm/s LVOT VTI:          0.132 m LVOT/AV VTI ratio: 0.46 MITRAL VALVE                TRICUSPID VALVE MV Area (PHT): 2.52 cm     TR Peak grad:   23.8 mmHg MV Decel Time: 301 msec     TR Vmax:        244.00 cm/s MV E velocity: 73.90 cm/s MV A velocity: 107.00 cm/s  SHUNTS MV E/A ratio:  0.69         Systemic VTI:  0.13 m                             Systemic Diam: 2.00 cm Ida Rogue MD Electronically signed by Ida Rogue MD Signature Date/Time: 05/07/2022/1:19:39 PM    Final    CT Angio Chest PE W/Cm &/Or Wo Cm  Result Date: 05/06/2022 CLINICAL DATA:  Shortness  of breath, weakness. Pulmonary embolism (PE) suspected, high prob EXAM: CT ANGIOGRAPHY CHEST WITH CONTRAST TECHNIQUE: Multidetector CT imaging of the chest was performed using the standard protocol during bolus administration of intravenous contrast. Multiplanar CT image reconstructions and MIPs were obtained to evaluate the vascular anatomy. RADIATION DOSE REDUCTION: This exam was performed according to the departmental dose-optimization program which includes automated exposure control, adjustment of the mA and/or kV according to patient size and/or use of iterative reconstruction technique. CONTRAST:  45m OMNIPAQUE IOHEXOL 350 MG/ML SOLN COMPARISON:  11/15/2019 FINDINGS: Cardiovascular: No filling defects in the pulmonary arteries to suggest pulmonary  emboli. Heart is normal size. Aorta is normal caliber. Coronary artery and aortic calcifications. Aortic valve repair. Mediastinum/Nodes: No mediastinal, hilar, or axillary adenopathy. Trachea and esophagus are unremarkable. Thyroid unremarkable. Lungs/Pleura: Mild elevation of the left hemidiaphragm. No confluent airspace opacities or effusions. Upper Abdomen: Imaging into the upper abdomen demonstrates no acute findings. Musculoskeletal: Chest wall soft tissues are unremarkable. No acute bony abnormality. Review of the MIP images confirms the above findings. IMPRESSION: No evidence of pulmonary embolus. No acute cardiopulmonary disease. Coronary artery disease. Aortic Atherosclerosis (ICD10-I70.0). Electronically Signed   By: Rolm Baptise M.D.   On: 05/06/2022 18:47   DG Chest 2 View  Result Date: 05/06/2022 CLINICAL DATA:  Suspected sepsis EXAM: CHEST - 2 VIEW COMPARISON:  CT chest 11/15/2019 FINDINGS: Postprocedural changes from prior aortic valve replacement. No pleural effusion. No pneumothorax. Asymmetric elevation of the left hemidiaphragm, unchanged compared to prior exam. No focal airspace opacity. Unchanged cardiac and mediastinal contours. There is  evidence of pulmonary venous congestion without evidence of overt pulmonary edema. Gas distended stomach. No acute osseous abnormality. IMPRESSION: No active cardiopulmonary disease. Electronically Signed   By: Marin Roberts M.D.   On: 05/06/2022 16:16    Microbiology: Results for orders placed or performed during the hospital encounter of 05/06/22  Culture, blood (Routine x 2)     Status: None   Collection Time: 05/06/22  3:32 PM   Specimen: BLOOD  Result Value Ref Range Status   Specimen Description BLOOD BLOOD RIGHT WRIST  Final   Special Requests   Final    BOTTLES DRAWN AEROBIC AND ANAEROBIC Blood Culture adequate volume   Culture   Final    NO GROWTH 5 DAYS Performed at [facility], [address], [place], [place] [address]    Report Status 05/11/2022 FINAL  Final  Culture, blood (Routine x 2)     Status: None   Collection Time: 05/06/22  4:26 PM   Specimen: BLOOD  Result Value Ref Range Status   Specimen Description BLOOD BLOOD LEFT ARM  Final   Special Requests   Final    BOTTLES DRAWN AEROBIC AND ANAEROBIC Blood Culture results may not be optimal due to an inadequate volume of blood received in culture bottles   Culture   Final    NO GROWTH 5 DAYS Performed at [facility], [address] [place], [place] [address]    Report Status 05/11/2022 FINAL  Final  Resp Panel by RT-PCR (Flu A&B, Covid) Anterior Nasal Swab     Status: None   Collection Time: 05/06/22  6:45 PM   Specimen: Anterior Nasal Swab  Result Value Ref Range Status   SARS Coronavirus 2 by RT PCR NEGATIVE NEGATIVE Final    Comment: (NOTE) SARS-CoV-2 target nucleic acids are NOT DETECTED.  The SARS-CoV-2 RNA is generally detectable in upper respiratory specimens during the acute phase of infection. The lowest concentration of SARS-CoV-2 viral copies this assay can detect is 138 copies/mL. A negative result does not preclude SARS-Cov-2 infection and should not be used as the sole basis  for treatment or other patient management decisions. A negative result may occur with  improper specimen collection/handling, submission of specimen other than nasopharyngeal swab, presence of viral mutation(s) within the areas targeted by this assay, and inadequate number of viral copies(<138 copies/mL). A negative result must be combined with clinical observations, patient history, and epidemiological information. The expected result is Negative.  Fact Sheet for Patients:  EntrepreneurPulse.com.au  Fact Sheet for  Healthcare Providers:  IncredibleEmployment.be  This test is no t yet approved or cleared by the [place] and  has been authorized for detection and/or diagnosis of SARS-CoV-2 by FDA under an Emergency Use Authorization (EUA). This EUA will remain  in effect (meaning this test can be used) for the duration of the COVID-19 declaration under Section 564(b)(1) of the Act, 21 U.S.C.section 360bbb-3(b)(1), unless the authorization is terminated  or revoked sooner.       Influenza A by PCR NEGATIVE NEGATIVE Final   Influenza B by PCR NEGATIVE NEGATIVE Final    Comment: (NOTE) The Xpert Xpress SARS-CoV-2/FLU/RSV plus assay is intended as an aid in the diagnosis of influenza from Nasopharyngeal swab specimens and should not be used as a sole basis for treatment. Nasal washings and aspirates are unacceptable for Xpert Xpress SARS-CoV-2/FLU/RSV testing.  Fact Sheet for Patients: EntrepreneurPulse.com.au  Fact Sheet for Healthcare Providers: IncredibleEmployment.be  This test is not yet approved or cleared by the [place] FDA and has been authorized for detection and/or diagnosis of SARS-CoV-2 by FDA under an Emergency Use Authorization (EUA). This EUA will remain in effect (meaning this test can be used) for the duration of the COVID-19 declaration under Section 564(b)(1) of the Act, 21  U.S.C. section 360bbb-3(b)(1), unless the authorization is terminated or revoked.  Performed at [facility], [address] [place], [place] [address]     Labs: CBC: Recent Labs  Lab 05/10/22 0434  WBC 7.9  HGB 13.9  HCT 45.6  MCV 97.9  PLT 149   Basic Metabolic Panel: Recent Labs  Lab 05/08/22 0528 05/09/22 0454 05/10/22 0434 05/11/22 0655 05/12/22 0630  NA 133* 137 135 136 136  K 4.4 4.8 4.2 4.3 3.9  CL 85* 86* 86* 87* 85*  CO2 41* 41* 40* 42* 42*  GLUCOSE 118* 115* 107* 116* 116*  BUN 19 31* 45* 40* 30*  CREATININE 1.00 0.96 1.15 0.94 0.86  CALCIUM 9.2 9.1 9.0 8.7* 8.6*  MG  --   --   --  2.3  --    Liver Function Tests: No results for input(s): "AST", "ALT", "ALKPHOS", "BILITOT", "PROT", "ALBUMIN" in the last 168 hours.  CBG: No results for input(s): "GLUCAP" in the last 168 hours.  Discharge time spent: less than 30 minutes.  Signed: Ezekiel Slocumb, DO [facility] 05/14/2022

## 2022-05-12 NOTE — Progress Notes (Signed)
Rounding Note    Patient Name: Kenneth Larsen Date of Encounter: 05/12/2022  [place] Cardiologist: Ida Rogue, MD   Subjective   Patient seen on a.m. rounds.  Sitting up in the recliner.  Family remains at the bedside.  Potential discharge after having home oxygen and BiPAP set up.  Accurate documentation of I's and O's in the last 24 hours.  Denies any chest pain or worsening shortness of breath.  Continues on 3 L of O2 via nasal cannula.  Inpatient Medications    Scheduled Meds:  aspirin EC  81 mg Oral Daily   dapagliflozin propanediol  10 mg Oral Daily   docusate sodium  100 mg Oral BID   enoxaparin (LOVENOX) injection  40 mg Subcutaneous Q24H   feeding supplement  237 mL Oral BID BM   furosemide  40 mg Oral BID   guaiFENesin  600 mg Oral BID   levothyroxine  50 mcg Oral QAC breakfast   losartan  50 mg Oral Daily   magnesium citrate  1 Bottle Oral Once   metoprolol succinate  25 mg Oral Daily   mometasone-formoterol  2 puff Inhalation BID   multivitamin with minerals  1 tablet Oral Daily   polyethylene glycol  17 g Oral Daily   senna-docusate  1 tablet Oral BID   Continuous Infusions:  PRN Meds: acetaminophen **OR** acetaminophen, ipratropium-albuterol, ondansetron **OR** ondansetron (ZOFRAN) IV, mouth rinse   Vital Signs    Vitals:   05/11/22 2007 05/11/22 2200 05/12/22 0558 05/12/22 0721  BP: (!) 121/51  (!) 118/54 (!) 111/51  Pulse: 74  64 70  Resp: '18  17 18  '$ Temp: 98.5 F (36.9 C)  98.6 F (37 C) 98 F (36.7 C)  TempSrc: Oral  Oral Oral  SpO2: 97% 99% 96% 95%  Weight:      Height:        Intake/Output Summary (Last 24 hours) at 05/12/2022 0851 Last data filed at 05/11/2022 1900 Gross per 24 hour  Intake 840 ml  Output 850 ml  Net -10 ml      05/11/2022    5:00 AM 05/10/2022    5:12 AM 05/09/2022    5:35 AM  Last 3 Weights  Weight (lbs) 213 lb 10 oz 217 lb 6 oz 214 lb 11.7 oz  Weight (kg) 96.9 kg 98.6 kg 97.4 kg       Telemetry    Sinus with PACs ST depression and T wave inversion rate of 75- Personally Reviewed  ECG    New tracings- Personally Reviewed  Physical Exam   GEN: No acute distress.   Neck: No JVD Cardiac: RRR, no murmurs, rubs, or gallops.  Respiratory: Diminished to auscultation bilaterally.  Rations are unlabored sitting in recliner at rest on 3 L of O2 via nasal cannula GI: Soft, nontender, non-distended  MS: 1+ edema to the bilateral lower extremities with knee-high compression stockings on bilaterally; No deformity. Neuro:  Nonfocal  Psych: Normal affect   Labs    High Sensitivity Troponin:   Recent Labs  Lab 05/06/22 1746 05/06/22 2013  TROPONINIHS 31* 30*     Chemistry Recent Labs  Lab 05/06/22 1532 05/06/22 2012 05/10/22 0434 05/11/22 0655 05/12/22 0630  NA 129*   < > 135 136 136  K 3.8   < > 4.2 4.3 3.9  CL 90*   < > 86* 87* 85*  CO2 32   < > 40* 42* 42*  GLUCOSE 131*   < >  107* 116* 116*  BUN 12   < > 45* 40* 30*  CREATININE 0.80   < > 1.15 0.94 0.86  CALCIUM 8.4*   < > 9.0 8.7* 8.6*  MG  --   --   --  2.3  --   PROT 6.4*  --   --   --   --   ALBUMIN 3.6  --   --   --   --   AST 15  --   --   --   --   ALT 10  --   --   --   --   ALKPHOS 59  --   --   --   --   BILITOT 1.2  --   --   --   --   GFRNONAA >60   < > >60 >60 >60  ANIONGAP 7   < > '9 7 9   '$ < > = values in this interval not displayed.    Lipids No results for input(s): "CHOL", "TRIG", "HDL", "LABVLDL", "LDLCALC", "CHOLHDL" in the last 168 hours.  Hematology Recent Labs  Lab 05/06/22 1532 05/06/22 2059 05/10/22 0434  WBC 7.1 6.6 7.9  RBC 5.03 4.73 4.66  HGB 15.2 14.3 13.9  HCT 47.2 44.7 45.6  MCV 93.8 94.5 97.9  MCH 30.2 30.2 29.8  MCHC 32.2 32.0 30.5  RDW 13.0 12.9 12.8  PLT 258 236 285   Thyroid  Recent Labs  Lab 05/06/22 2012  TSH 2.137    BNP Recent Labs  Lab 05/06/22 1532 05/09/22 0949  BNP 255.5* 55.2    DDimer No results for input(s): "DDIMER" in the last  168 hours.   Radiology    No results found.  Cardiac Studies  TTE 05/07/22 1. Left ventricular ejection fraction, by estimation, is 55 to 60%. The  left ventricle has normal function. The left ventricle has no regional  wall motion abnormalities. Left ventricular diastolic parameters are  consistent with Grade I diastolic  dysfunction (impaired relaxation).   2. Right ventricular systolic function is normal. The right ventricular  size is normal. There is normal pulmonary artery systolic pressure. The  estimated right ventricular systolic pressure is 52.8 mmHg.   3. The mitral valve is normal in structure. No evidence of mitral valve  regurgitation. No evidence of mitral stenosis.   4. The aortic valve has been repaired/replaced. s/p TAVR. Aortic valve  regurgitation is not visualized. No aortic stenosis is present. Aortic  valve area, by VTI measures 1.45 cm. Aortic valve mean gradient measures  5.2 mmHg. Aortic valve Vmax measures  1.56 m/s.   5. The inferior vena cava is normal in size with greater than 50%  respiratory variability, suggesting right atrial pressure of 3 mmHg.   Patient Profile     76 y.o. male with a past medical history of hypertension, COPD, tobacco history, chronic diastolic congestive heart failure, severe aortic stenosis status post TAVR in 11/11/2018, who has been seen and evaluated for heart failure exacerbation.  Assessment & Plan    Acute on chronic diastolic congestive heart failure -BNP 255 on admission -LVEF 55-60% -Lasix 40 mg daily -Daily BMP -Continue Farxiga 10 mg daily -first dose this morning as patient and wife were concerned with some of the common side effects of slight drop in blood pressure and elevated tension and kidney function, questions were answered and patient and family agreed to start medication this morning -Denies any worsening shortness of breath -Is being discharged  on home oxygen and BiPAP therapy -Daily weight, INO,  low-sodium diet  History of failure with hypoxia and hypercapnia -Multifactorial CHF, COPD, medication nonadherence -CT of the chest negative for PE -Pulmonary continues to follow -Titrating FiO2 to keep sats greater than equal to 92%, currently maintaining on 3 L -Continue pulmonary toileting  Severe aortic stenosis status post TAVR in 2020 -Recent echocardiogram reveals well-functioning aortic valve -stable  Elevated high-sensitivity troponin -High-sensitivity troponins with minimal elevation -Demand ischemia from fluid overload -Remains chest pain-free -Left heart cath in 2020 revealed mild nonobstructive disease -No further plans for ischemic work-up -Continue aspirin, losartan, Toprol XL  Hypertension -Blood pressure 111/51 -Continued on losartan and Toprol-XL -Vitals per unit protocol     For questions or updates, please contact [place] Please consult [email or web address] for contact info under        Signed, Lynette Noah, NP  05/12/2022, 8:51 AM

## 2022-05-12 NOTE — Progress Notes (Signed)
Mobility Specialist - Progress Note   05/12/22 0915  Mobility  Activity Ambulated with assistance in hallway  Level of Assistance Standby assist, set-up cues, supervision of patient - no hands on  Assistive Device Front wheel walker  Distance Ambulated (ft) 200 ft  Activity Response Tolerated well  $Mobility charge 1 Mobility    Pre-mobility: SpO2 = 94 During mobility: SpO2 = 93 Post-mobility: SPO2 = 92  Pt sitting in chair on 3L upon arrival. Pt STS indep and ambulates in hallway SBA. PT has one LOB corrected by CGA. Pt returns to chair with needs in reach and family in room.   Gretchen Short  Mobility Specialist  05/12/22 9:18 AM

## 2022-05-12 NOTE — Progress Notes (Signed)
PULMONOLOGY         Date: 05/12/2022,   MRN# [id number] Kenneth Larsen 11-29-1945     AdmissionWeight: 99.8 kg                 CurrentWeight: 96.9 kg  Referring provider: Dr Tawanna Solo   CHIEF COMPLAINT:   Acute on chronic hypoxemic respiratory failure   HISTORY OF PRESENT ILLNESS   76 yo M with hx of essential hypertension, hypothyroidism, lilfelong smoker with COPD and chronic hypoxemia on O2 in the past, hx of CHF with TAVR and preserved ejection fraction came in with chest discomfort and SOB associated with malaise and LE edema.  He was noted to be tachyarrythmic and dyspneic found to have hypoxemia requiring 5L/min [place].  He had blood work done in ER with mild elevation of BNP >250 mild hyponatremia with hypervolemic fluid status, mild hypertension, normal VBG on 5L O2, essentially normal CBC and viral workup was negative for covid, and flu.  He had CT chest which I reviewed with findings of bronchitic changes diffusely and bilaterally to moderate severity with absence of substantial pulmonary edema, pleural effusions, consolidated infiltrate negative for PE, no areas of frank honeycombing or pneumothorax. He was able to speak in full sentences and endorses chest discomfort unlike his normal dyspnea. He denies constitutional symptoms. PCCM consultation for further evaluation and management.   05/08/22- patient feels improved, his wife shares he had episode of resp distress overnight but she thinks this was due to O2 line kinking since patient was unable to appreciate any air flow through nasal canula.  We discussed being careful when moving around to NOT sit on or bend cordage for oxygen therapy.  He is cleared from pulmonary standpoint and is able to finish prednisone taper for bronchitis.  Please reduce prednisone by '5mg'$  daily.   05/09/22- patient seems more slow to answer and encephalopathic during interview today.  He was noted to be confused by wife and she communicated this  with RN.  An abg was done with findings of hypercapnic respiratory failure.  I have discussed this with family and ordered bipap for now.  He may need to be monitored in PCU.  Im stopping his lasix for now due to worsening hypercarbia with contraction alkalosis. Reducing prednisone to '40mg'$  from 50.  Will need to hold DC today    05/10/22 - patient is improved with lucid mentation.  We discussed needing BIPAP on outpatient,  I have submitted request to [employer] to assist patient acquiring medical equipment.   05/11/22- patient is s/p repeat ABG with normal pH now although chronically elevated CO2.  Patient seems to need NIV with BIPAP due to hypercapnic respiratory failure with hypoxemia.  Patient currently has [employer] for O2.    05/12/22-  patient is feeling good, he has daughter and wife at bedside.  We discussed him needing NIV.  PAST MEDICAL HISTORY   Past Medical History:  Diagnosis Date   Arthritis    Cancer ([place])    2010-melanoma   Chronic diastolic (congestive) heart failure ([facility])    COPD (chronic obstructive pulmonary disease) ([place])    Hypertension    Hypothyroidism    Severe aortic stenosis      SURGICAL HISTORY   Past Surgical History:  Procedure Laterality Date   CARDIAC CATHETERIZATION     RIGHT/LEFT HEART CATH AND CORONARY ANGIOGRAPHY N/A 10/13/2018   Procedure: RIGHT/LEFT HEART CATH AND CORONARY ANGIOGRAPHY;  Surgeon: Nelva Bush, MD;  Location: [facility];  Service: Cardiovascular;  Laterality: N/A;   TEE WITHOUT CARDIOVERSION N/A 11/15/2018   Procedure: TRANSESOPHAGEAL ECHOCARDIOGRAM (TEE);  Surgeon: Burnell Blanks, MD;  Location: [facility];  Service: Open Heart Surgery;  Laterality: N/A;   TRANSCATHETER AORTIC VALVE REPLACEMENT, TRANSFEMORAL  11/15/2018   TRANSCATHETER AORTIC VALVE REPLACEMENT, TRANSFEMORAL N/A 11/15/2018   Procedure: TRANSCATHETER AORTIC VALVE REPLACEMENT, TRANSFEMORAL;  Surgeon: Burnell Blanks, MD;   Location: [facility];  Service: Open Heart Surgery;  Laterality: N/A;   VASECTOMY       FAMILY HISTORY   Family History  Problem Relation Age of Onset   Cancer Father      SOCIAL HISTORY   Social History   Tobacco Use   Smoking status: Former    Packs/day: 1.00    Years: 25.00    Total pack years: 25.00    Types: Cigarettes   Smokeless tobacco: Never  Vaping Use   Vaping Use: Never used  Substance Use Topics   Alcohol use: Never   Drug use: Never     MEDICATIONS    Home Medication:    Current Medication:  Current Facility-Administered Medications:    acetaminophen (TYLENOL) tablet 650 mg, 650 mg, Oral, Q6H PRN **OR** acetaminophen (TYLENOL) suppository 650 mg, 650 mg, Rectal, Q6H PRN, Athena Masse, MD   aspirin EC tablet 81 mg, 81 mg, Oral, Daily, Judd Gaudier V, MD, 81 mg at 05/11/22 1727   dapagliflozin propanediol (FARXIGA) tablet 10 mg, 10 mg, Oral, Daily, Arrien, Jimmy Picket, MD   docusate sodium (COLACE) capsule 100 mg, 100 mg, Oral, BID, Sharion Settler, NP, 100 mg at 05/11/22 1727   enoxaparin (LOVENOX) injection 40 mg, 40 mg, Subcutaneous, Q24H, Judd Gaudier V, MD, 40 mg at 05/11/22 2057   feeding supplement (ENSURE ENLIVE / ENSURE PLUS) liquid 237 mL, 237 mL, Oral, BID BM, Adhikari, Amrit, MD, 237 mL at 05/10/22 1400   furosemide (LASIX) tablet 40 mg, 40 mg, Oral, BID, Turner, Traci R, MD, 40 mg at 05/11/22 1728   guaiFENesin (MUCINEX) 12 hr tablet 600 mg, 600 mg, Oral, BID, Adhikari, Amrit, MD, 600 mg at 05/11/22 2058   ipratropium-albuterol (DUONEB) 0.5-2.5 (3) MG/3ML nebulizer solution 3 mL, 3 mL, Nebulization, Q4H PRN, Athena Masse, MD   levothyroxine (SYNTHROID) tablet 50 mcg, 50 mcg, Oral, QAC breakfast, Judd Gaudier V, MD, 50 mcg at 05/12/22 0554   losartan (COZAAR) tablet 50 mg, 50 mg, Oral, Daily, Gollan, Kathlene November, MD, 50 mg at 05/11/22 1727   magnesium citrate solution 1 Bottle, 1 Bottle, Oral, Once, Pahwani, Rinka R, MD    metoprolol succinate (TOPROL-XL) 24 hr tablet 25 mg, 25 mg, Oral, Daily, Gollan, Kathlene November, MD, 25 mg at 05/11/22 1728   mometasone-formoterol (DULERA) 200-5 MCG/ACT inhaler 2 puff, 2 puff, Inhalation, BID, Arrien, Jimmy Picket, MD   multivitamin with minerals tablet 1 tablet, 1 tablet, Oral, Daily, Adhikari, Amrit, MD, 1 tablet at 05/11/22 1728   ondansetron (ZOFRAN) tablet 4 mg, 4 mg, Oral, Q6H PRN **OR** ondansetron (ZOFRAN) injection 4 mg, 4 mg, Intravenous, Q6H PRN, Athena Masse, MD   Oral care mouth rinse, 15 mL, Mouth Rinse, PRN, Pahwani, Rinka R, MD   polyethylene glycol (MIRALAX / GLYCOLAX) packet 17 g, 17 g, Oral, Daily, Adhikari, Amrit, MD, 17 g at 05/11/22 1727   senna-docusate (Senokot-S) tablet 1 tablet, 1 tablet, Oral, BID, Shelly Coss, MD, 1 tablet at 05/11/22 1727    ALLERGIES  Penicillins     REVIEW OF SYSTEMS    Review of Systems:  Gen:  Denies  fever, sweats, chills weigh loss  HEENT: Denies blurred vision, double vision, ear pain, eye pain, hearing loss, nose bleeds, sore throat Cardiac:  No dizziness, chest pain or heaviness, chest tightness,edema Resp:   reports dyspnea chronically  Gi: Denies swallowing difficulty, stomach pain, nausea or vomiting, diarrhea, constipation, bowel incontinence Gu:  Denies bladder incontinence, burning urine Ext:   Denies Joint pain, stiffness or swelling Skin: Denies  skin rash, easy bruising or bleeding or hives Endoc:  Denies polyuria, polydipsia , polyphagia or weight change Psych:   Denies depression, insomnia or hallucinations   Other:  All other systems negative   VS: BP (!) 111/51 (BP Location: Right Arm)   Pulse 70   Temp 98 F (36.7 C) (Oral)   Resp 18   Ht '6\' 1"'$  (1.854 m)   Wt 96.9 kg   SpO2 95%   BMI 28.18 kg/m      PHYSICAL EXAM    GENERAL:NAD, no fevers, chills, no weakness no fatigue HEAD: Normocephalic, atraumatic.  EYES: Pupils equal, round, reactive to light. Extraocular muscles  intact. No scleral icterus.  MOUTH: Moist mucosal membrane. Dentition intact. No abscess noted.  EAR, NOSE, THROAT: Clear without exudates. No external lesions.  NECK: Supple. No thyromegaly. No nodules. No JVD.  PULMONARY: decreased breath sounds with mild rhonchi worse at bases bilaterally.  CARDIOVASCULAR: S1 and S2. Regular rate and rhythm. No murmurs, rubs, or gallops. No edema. Pedal pulses 2+ bilaterally.  GASTROINTESTINAL: Soft, nontender, nondistended. No masses. Positive bowel sounds. No hepatosplenomegaly.  MUSCULOSKELETAL: No swelling, clubbing, or edema. Range of motion full in all extremities.  NEUROLOGIC: Cranial nerves II through XII are intact. No gross focal neurological deficits. Sensation intact. Reflexes intact.  SKIN: No ulceration, lesions, rashes, or cyanosis. Skin warm and dry. Turgor intact.  PSYCHIATRIC: Mood, affect within normal limits. The patient is awake, alert and oriented x 3. Insight, judgment intact.       IMAGING   Reviewed CTPE performed in ER during this admission   ASSESSMENT/PLAN    Acute on chronic hypoxemic respiratory failure    - overall presentation seems to be more consistent with viral LRTI induced mild exacerbation of CHF    - I reviewed his CT chest there are bronchitic changes bilaterally , this will be treated with short course of steroids    - he has cardiology evaluation and TTE in progress and this will be good to optimize underlying cardiac dysfunction    -Patient has stable chronic COPD without excess mucus production or darkened inspissated expectoration of phlegm -BIPAP for now and repeat abg in 4h -dc lasix due to contraction alkalosis and hypercarbia   Additional active comorbid conditions:     -Advanced COPD and chronic hypoxemia      -Acute on chronic HFpEF     -Metabolic syndrome     -s/p TAVR     -Hypothyroidism     - Advanced Age     - Deconditioning      - GI/DVT ppx      Thank you for allowing me to  participate in the care of this patient.  Patient/Family are satisfied with care plan and all questions have been answered.    Provider disclosure: Patient with at least one acute or chronic illness or injury that poses a threat to life or bodily function and is being managed actively during this encounter.  All of the below services have been performed independently by signing provider:  review of prior documentation from internal and or external health records.  Review of previous and current lab results.  Interview and comprehensive assessment during patient visit today. Review of current and previous chest radiographs/CT scans. Discussion of management and test interpretation with health care team and patient/family.   This document was prepared using Dragon voice recognition software and may include unintentional dictation errors.     Ottie Glazier, M.D.  Division of Pulmonary & Critical Care Medicine

## 2022-05-14 ENCOUNTER — Encounter: Payer: Self-pay | Admitting: Internal Medicine

## 2022-05-18 ENCOUNTER — Ambulatory Visit: Payer: Medicare Other | Admitting: Family

## 2022-06-07 NOTE — Progress Notes (Unsigned)
Cardiology Office Note  Date:  06/08/2022   ID:  Kenneth [facility]en 04/02/1946, MRN [id number]  PCP:  Kenn[facility]   Chief Complaint  Patient presents with   [facility] follow up     "Doing well." Medications reviewed by the patient verbally.     HPI:  Kenneth [facility]en is a 76 y.o. 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 Bronchodilator therapy and systemic corticosteroids.  Received furosemide for diuresis.   On today's visit he presents with his wife Doing better, stronger On oxygen 2-3 liters On bipap at night at home, for elevated CO2 level in the [facility] Weight trending down , was 215, now 209 at home Weight here 216 pounds On lasix 40 daily  Able to [facility] some of [facility] including inhalers, [facility] Almost out of Kenneth [facility], requesting refill  BP stable at home 948 to 546 systolic  Echo 2/70 reviewed  1. Left ventricular ejection fraction, by estimation, is 55 to 60%. The  left ventricle has normal function. The left ventricle has no regional  wall motion abnormalities. Left ventricular diastolic parameters are  consistent with Grade I diastolic  dysfunction (impaired relaxation).   2. Right ventricular systolic function is normal. The right ventricular  size is normal. There is normal pulmonary artery systolic pressure. The  estimated right ventricular systolic pressure is 35.0 mmHg.   3. The mitral valve is normal in structure. No evidence of mitral valve  regurgitation. No evidence of mitral stenosis.   4. The aortic valve has been  repaired/replaced. s/p TAVR. Aortic valve  regurgitation is not visualized. No aortic stenosis is present. Aortic  valve area, by VTI measures 1.45 cm. Aortic valve mean gradient measures  5.2 mmHg. Aortic valve Vmax measures  1.56 m/s.   5. The inferior vena cava is normal in size with greater than 50%  respiratory variability, suggesting right atrial pressure of 3 mmHg.   EKG personally reviewed by myself on todays visit Nsr rate 63 bpm T wave ABN  Other past medical history reviewed successful TAVR with [facility] 3 THV (size 63m) using a transfemoral approach.   Post op echo showed EF 50-55%, normally functioning TAVR with mean gradient of 11 mm Hg and no PVL.   He was discharged on POD1 with aspirin and plavix.    pre TAVR 10/13/18 showed showed mild nonobstructive coronary disease.    showed EF 55% with normally functioning TAVR with a mean gradient of 15 mm Hg.   PMH:   has a past medical history of Arthritis, Cancer ([facility], Chronic diastolic (congestive) heart failure (Kenneth [facility], COPD (chronic obstructive pulmonary disease) (Kenneth [facility], Hypertension, Hyponatremia (05/06/2022), Hypothyroidism, and Severe aortic stenosis.  PSH:    Past Surgical History:  Procedure Laterality Date   CARDIAC CATHETERIZATION     RIGHT/LEFT HEART CATH AND CORONARY ANGIOGRAPHY N/A 10/13/2018   Procedure: RIGHT/LEFT HEART CATH AND CORONARY ANGIOGRAPHY;  Surgeon: Kenneth Bush MD;  [facility];  Service: Cardiovascular;  Laterality: N/A;   TEE WITHOUT CARDIOVERSION N/A 11/15/2018   Procedure: TRANSESOPHAGEAL  ECHOCARDIOGRAM (TEE);  Surgeon: Kenneth Blanks, MD;  Location: [facility];  Service: Open Heart Surgery;  Laterality: N/A;   TRANSCATHETER AORTIC VALVE REPLACEMENT, TRANSFEMORAL  11/15/2018   TRANSCATHETER AORTIC VALVE REPLACEMENT, TRANSFEMORAL N/A 11/15/2018   Procedure: TRANSCATHETER AORTIC VALVE REPLACEMENT, TRANSFEMORAL;  Surgeon: Kenneth Blanks, MD;   Location: [place] [facility];  Service: Open Heart Surgery;  Laterality: N/A;   VASECTOMY      Current Outpatient Medications  Medication Sig Dispense Refill   aspirin EC 81 MG EC tablet Take 1 tablet (81 mg total) by mouth daily. 30 tablet 0   Cholecalciferol (VITAMIN D-1000 MAX ST) 25 MCG (1000 UT) tablet Take 1,000 Units by mouth daily.      furosemide (LASIX) 40 MG tablet Take 1 tablet (40 mg total) by mouth daily. 30 tablet 11   guaiFENesin (MUCINEX) 600 MG 12 hr tablet Take 1 tablet (600 mg total) by mouth 2 (two) times daily as needed. 30 tablet 0   levothyroxine (SYNTHROID, LEVOTHROID) 50 MCG tablet Take 50 mcg by mouth daily before breakfast.      losartan (COZAAR) 50 MG tablet Take 1 tablet (50 mg total) by mouth daily. 30 tablet 1   metoprolol succinate (TOPROL-XL) 25 MG 24 hr tablet Take 1 tablet (25 mg total) by mouth daily. 30 tablet 1   mometasone-formoterol (DULERA) 200-5 MCG/ACT AERO Inhale 2 puffs into the lungs 2 (two) times daily. 1 each 1   vitamin B-12 (CYANOCOBALAMIN) 1000 MCG tablet Take 1,000 mcg by mouth daily.     albuterol (VENTOLIN HFA) 108 (90 Base) MCG/ACT inhaler Inhale 2 puffs into the lungs every 6 (six) hours as needed for wheezing or shortness of breath. (Patient not taking: Reported on 06/08/2022) 8 g 2   dapagliflozin propanediol (FARXIGA) 10 MG TABS tablet Take 1 tablet (10 mg total) by mouth daily before breakfast. (Patient not taking: Reported on 06/08/2022) 30 tablet 1   docusate sodium (COLACE) 100 MG capsule Take 1 capsule (100 mg total) by mouth 2 (two) times daily as needed for mild constipation. (Patient not taking: Reported on 06/08/2022) 10 capsule 0   feeding supplement (ENSURE ENLIVE / ENSURE PLUS) LIQD Take 237 mLs by mouth 2 (two) times daily between meals. (Patient not taking: Reported on 06/08/2022) 237 mL 12   Multiple Vitamin (MULTIVITAMIN WITH MINERALS) TABS tablet Take 1 tablet by mouth daily. (Patient not taking: Reported on 06/08/2022)      senna-docusate (SENOKOT-S) 8.6-50 MG tablet Take 1 tablet by mouth at bedtime as needed for moderate constipation. (Patient not taking: Reported on 06/08/2022)     tiotropium (SPIRIVA HANDIHALER) 18 MCG inhalation capsule Place 1 capsule (18 mcg total) into inhaler and inhale daily. (Patient not taking: Reported on 06/08/2022) 30 capsule 1   No current facility-administered medications for this visit.    Allergies:   Penicillins   Social History:  The patient  reports that he has quit smoking. His smoking use included cigarettes. He has a 25.00 pack-year smoking history. He has never used smokeless tobacco. He reports that he does not drink alcohol and does not use drugs.   Family History:   family history includes Cancer in his father.    Review of Systems: Review of Systems  Constitutional: Negative.   HENT: Negative.    Respiratory: Negative.    Cardiovascular: Negative.   Gastrointestinal: Negative.   Musculoskeletal: Negative.   Neurological: Negative.   Psychiatric/Behavioral: Negative.    All other systems reviewed  and are negative.    PHYSICAL EXAM: VS:  BP (!) 168/80 (BP Location: Left Arm, Patient Position: Sitting, Cuff Size: Normal)   Pulse 63   Ht '6\' 1"'$  (1.854 m)   Wt 216 lb (98 kg)   SpO2 99% Comment: 3 Liters of oxygen  BMI 28.50 kg/m  , BMI Body mass index is 28.5 kg/m. Constitutional:  oriented to person, place, and time. No distress.  On oxygen by nasal cannula HENT:  Head: Grossly normal Eyes:  no discharge. No scleral icterus.  Neck: No JVD, no carotid bruits  Cardiovascular: Regular rate and rhythm, no murmurs appreciated Pulmonary/Chest: Clear to auscultation bilaterally, no wheezes or rails Abdominal: Soft.  no distension.  no tenderness.  Musculoskeletal: Normal range of motion Neurological:  normal muscle tone. Coordination normal. No atrophy Skin: Skin warm and dry Psychiatric: normal affect, pleasant  Recent Labs: 05/06/2022: ALT 10; TSH  2.137 05/09/2022: B Natriuretic Peptide 55.2 05/10/2022: Hemoglobin 13.9; Platelets 285 05/11/2022: Magnesium 2.3 05/12/2022: BUN 30; Creatinine, Ser 0.86; Potassium 3.9; Sodium 136   Lipid Panel Lab Results  Component Value Date   CHOL 117 10/14/2018   HDL 46 10/14/2018   LDLCALC 65 10/14/2018   TRIG 29 10/14/2018    Wt Readings from Last 3 Encounters:  06/08/22 216 lb (98 kg)  05/11/22 213 lb 10 oz (96.9 kg)  12/30/21 218 lb 8 oz (99.1 kg)     ASSESSMENT AND PLAN:  Problem List Items Addressed This Visit       Cardiology Problems   Hypertension   Severe aortic stenosis   Relevant Orders   EKG 12-Lead   Other Visit Diagnoses     Chronic diastolic heart failure ([place])    -  Primary   Relevant Orders   EKG 12-Lead   Centrilobular emphysema (HCC)       Relevant Orders   EKG 12-Lead   S/P AVR (aortic valve replacement)       Relevant Orders   EKG 12-Lead   History of transcatheter aortic valve replacement (TAVR)         Aortic valve stenosis/s/p TAVR Stable, echo stable, results reviewed Will continue Lasix 40 daily Appears euvolemic,  Chronic diastolic CHF Remains on Lasix, Farxiga, metoprolol succinate, losartan On BiPAP at nighttime  COPD On 2 L nasal cannula Non-smoker Scheduled to see pulmonary Refill sent in on [place] hypertension He is checking blood pressure daily at home, log book reviewed with him in detail and in general blood pressure well controlled No changes to his medications today   Total encounter time more than 30 minutes  Greater than 50% was spent in counseling and coordination of care with the patient    Signed, Esmond Plants, M.D., Ph.D. [place], [place]

## 2022-06-08 ENCOUNTER — Ambulatory Visit: Payer: Medicare Other | Attending: Cardiovascular Disease | Admitting: Cardiovascular Disease

## 2022-06-08 ENCOUNTER — Encounter: Payer: Self-pay | Admitting: Cardiovascular Disease

## 2022-06-08 VITALS — BP 150/80 | HR 63 | Ht 73.0 in | Wt 216.0 lb

## 2022-06-08 DIAGNOSIS — I350 Nonrheumatic aortic (valve) stenosis: Secondary | ICD-10-CM

## 2022-06-08 DIAGNOSIS — I5032 Chronic diastolic (congestive) heart failure: Secondary | ICD-10-CM

## 2022-06-08 DIAGNOSIS — I10 Essential (primary) hypertension: Secondary | ICD-10-CM | POA: Diagnosis present

## 2022-06-08 DIAGNOSIS — Z952 Presence of prosthetic heart valve: Secondary | ICD-10-CM

## 2022-06-08 DIAGNOSIS — J432 Centrilobular emphysema: Secondary | ICD-10-CM | POA: Diagnosis not present

## 2022-06-08 MED ORDER — MOMETASONE FURO-FORMOTEROL FUM 200-5 MCG/ACT IN AERO: 2.0000 | INHALATION_SPRAY | Freq: Two times a day (BID) | RESPIRATORY_TRACT | 1 refills | Status: DC

## 2022-06-08 MED ORDER — LOSARTAN POTASSIUM 50 MG PO TABS: 50.0000 mg | ORAL_TABLET | Freq: Every day | ORAL | 3 refills | Status: DC

## 2022-06-08 MED ORDER — METOPROLOL SUCCINATE ER 25 MG PO TB24: 25.0000 mg | ORAL_TABLET | Freq: Every day | ORAL | 3 refills | Status: DC

## 2022-06-08 NOTE — Patient Instructions (Addendum)
Medication Instructions:  No changes  If you need a refill on your cardiac medications before your next appointment, please call your pharmacy.    Lab work: No new labs needed   Testing/Procedures: No new testing needed   Follow-Up: At [facility], you and your health needs are our priority.  As part of our continuing mission to provide you with exceptional heart care, we have created designated Provider Care Teams.  These Care Teams include your primary Cardiologist (physician) and Advanced Practice Providers (APPs -  Physician Assistants and Nurse Practitioners) who all work together to provide you with the care you need, when you need it.  You will need a follow up appointment in 6 months  Providers on your designated Care Team:   Christopher Berge, NP Ryan Dunn, PA-C Cadence Furth, PA-C  COVID-19 Vaccine Information can be found at: [email or web address] For questions related to vaccine distribution or appointments, please email [email or web address] or call [phone].   

## 2022-11-17 ENCOUNTER — Telehealth: Payer: Self-pay | Admitting: Cardiovascular Disease

## 2022-11-17 NOTE — Telephone Encounter (Signed)
  Pt c/o medication issue:  1. Name of Medication:   mometasone-formoterol (DULERA) 200-5 MCG/ACT AERO    2. How are you currently taking this medication (dosage and times per day)? Inhale 2 puffs into the lungs 2 (two) times daily.   3. Are you having a reaction (difficulty breathing--STAT)? No   4. What is your medication issue? [place] [facility] calling, they said they need prior auth for this medication

## 2022-11-17 NOTE — Telephone Encounter (Signed)
Called and spoke with Kenneth [facility] with [facility] Reviewed that this medication is a inhaler and we do not prescribe that medication. Advised that this is a cardiology office and this request should be sent to patients pulmonary provider. She verbalized understanding with no further questions at this time.

## 2022-11-24 ENCOUNTER — Telehealth: Payer: Self-pay

## 2022-11-24 ENCOUNTER — Telehealth: Payer: Self-pay | Admitting: Cardiovascular Disease

## 2022-11-24 ENCOUNTER — Other Ambulatory Visit (HOSPITAL_COMMUNITY): Payer: Self-pay

## 2022-11-24 NOTE — Telephone Encounter (Signed)
Pharmacy Patient Advocate Encounter  Prior Authorization for [facility] has been approved.    PA# [id number] Effective dates: 08/24/22 through 08/24/23   Received notification from [facility] that prior authorization for [facility] is needed.    PA submitted on 11/24/22 Key [id number] Status is pending  Karie Soda, [place] Patient Advocate Specialist Direct Number: [phone] Fax: [phone]

## 2022-11-24 NOTE — Telephone Encounter (Signed)
Pt c/o medication issue:  1. Name of Medication:   mometasone-formoterol (DULERA) 200-5 MCG/ACT AERO    2. How are you currently taking this medication (dosage and times per day)?   nhale 2 puffs into the lungs 2 (two) times daily.          3. Are you having a reaction (difficulty breathing--STAT)? No  4. What is your medication issue? Pharmacy calling to f/u on Prior Auth for medication that was sent over on last Thursday. Please advise

## 2022-11-25 MED ORDER — MOMETASONE FURO-FORMOTEROL FUM 200-5 MCG/ACT IN AERO: 2.0000 | INHALATION_SPRAY | Freq: Two times a day (BID) | RESPIRATORY_TRACT | 3 refills | Status: DC

## 2022-11-25 NOTE — Addendum Note (Signed)
Addended by: Valora Corporal on: 11/25/2022 12:30 PM   Modules accepted: Orders

## 2022-11-25 NOTE — Telephone Encounter (Signed)
Left detailed voicemail message that pre-auth was done on inhaler and that I will notify his pharmacy with instructions to call back if any further questions.

## 2022-11-25 NOTE — Telephone Encounter (Signed)
Spoke with patient and reviewed that we have received authorization and that the pharmacy has been made aware. He verbalized understanding and was very appreciative for the help he received. No further questions or concerns.

## 2022-11-25 NOTE — Telephone Encounter (Signed)
Spoke with pharmacy to review authorization has been completed. No further needs

## 2022-12-23 ENCOUNTER — Other Ambulatory Visit: Payer: Self-pay | Admitting: Podiatry

## 2022-12-23 DIAGNOSIS — G629 Polyneuropathy, unspecified: Secondary | ICD-10-CM

## 2022-12-23 DIAGNOSIS — S93325A Dislocation of tarsometatarsal joint of left foot, initial encounter: Secondary | ICD-10-CM

## 2022-12-23 DIAGNOSIS — M79672 Pain in left foot: Secondary | ICD-10-CM

## 2022-12-23 DIAGNOSIS — S92322A Displaced fracture of second metatarsal bone, left foot, initial encounter for closed fracture: Secondary | ICD-10-CM

## 2022-12-24 ENCOUNTER — Ambulatory Visit
Admission: RE | Admit: 2022-12-24 | Discharge: 2022-12-24 | Disposition: A | Discharge status: Home / Self Care | Payer: Medicare Other | Source: Ambulatory Visit | Attending: Podiatry | Admitting: Podiatry

## 2022-12-24 DIAGNOSIS — G629 Polyneuropathy, unspecified: Secondary | ICD-10-CM

## 2022-12-24 DIAGNOSIS — M79672 Pain in left foot: Secondary | ICD-10-CM

## 2022-12-24 DIAGNOSIS — S92322A Displaced fracture of second metatarsal bone, left foot, initial encounter for closed fracture: Secondary | ICD-10-CM

## 2022-12-24 DIAGNOSIS — S93325A Dislocation of tarsometatarsal joint of left foot, initial encounter: Secondary | ICD-10-CM

## 2023-03-03 LAB — LAB REPORT - SCANNED
A1c: 5.6
EGFR: 74
TSH: 2.46 (ref 0.41–5.90)

## 2023-06-14 ENCOUNTER — Other Ambulatory Visit: Payer: Self-pay | Admitting: Cardiovascular Disease

## 2023-06-14 NOTE — Telephone Encounter (Signed)
Please contact pt for future appointment. Pt overdue for 6 month f/u. Pt needs to be seen yearly for refills.

## 2023-06-15 ENCOUNTER — Ambulatory Visit: Payer: Medicare Other | Attending: Cardiovascular Disease | Admitting: Cardiovascular Disease

## 2023-06-15 ENCOUNTER — Encounter: Payer: Self-pay | Admitting: Cardiovascular Disease

## 2023-06-15 VITALS — BP 150/70 | HR 64 | Ht 73.0 in | Wt 217.2 lb

## 2023-06-15 DIAGNOSIS — I5032 Chronic diastolic (congestive) heart failure: Secondary | ICD-10-CM

## 2023-06-15 DIAGNOSIS — I350 Nonrheumatic aortic (valve) stenosis: Secondary | ICD-10-CM

## 2023-06-15 DIAGNOSIS — Z952 Presence of prosthetic heart valve: Secondary | ICD-10-CM | POA: Diagnosis present

## 2023-06-15 DIAGNOSIS — I10 Essential (primary) hypertension: Secondary | ICD-10-CM | POA: Diagnosis present

## 2023-06-15 MED ORDER — LOSARTAN POTASSIUM 50 MG PO TABS: 50.0000 mg | ORAL_TABLET | Freq: Every day | ORAL | 3 refills | Status: DC

## 2023-06-15 MED ORDER — METOPROLOL SUCCINATE ER 25 MG PO TB24: 25.0000 mg | ORAL_TABLET | Freq: Every day | ORAL | 3 refills | Status: DC

## 2023-06-15 MED ORDER — FUROSEMIDE 40 MG PO TABS: 40.0000 mg | ORAL_TABLET | Freq: Every day | ORAL | 3 refills | Status: DC

## 2023-06-15 NOTE — Patient Instructions (Addendum)
Medication Instructions:  Lasix 40 mg daily with extra after lunch as needed for swelling  If you need a refill on your cardiac medications before your next appointment, please call your pharmacy.   Lab work: No new labs needed  Testing/Procedures: No new testing needed  Follow-Up: At [facility], you and your health needs are our priority.  As part of our continuing mission to provide you with exceptional heart care, we have created designated Provider Care Teams.  These Care Teams include your primary Cardiologist (physician) and Advanced Practice Providers (APPs -  Physician Assistants and Nurse Practitioners) who all work together to provide you with the care you need, when you need it.  You will need a follow up appointment in 6 months with APP  Providers on your designated Care Team:   Nicolasa Ducking, NP Eula Listen, PA-C Cadence Fransico Michael, [place]  COVID-19 Vaccine Information can be found at: [email or web address] For questions related to vaccine distribution or appointments, please email [email or web address] or call [phone].

## 2023-06-15 NOTE — Progress Notes (Signed)
Cardiology Office Note  Date:  06/15/2023   ID:  Keymarion, Mumma Jun 23, 1946, MRN [id number]  PCP:  Shane Crutch, PA   Chief Complaint  Patient presents with   6 month follow up     "Doing well." Medications reviewed by the patient verbally.     HPI:  ABEN MANSOOR is a 77 y.o. 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)  who presents to clinic for aortic valve disease, s/p TAVR  Last seen by myself in clinic 10/23 Broke foot 4/24, Followed by podiatry Reports his foot has healed  Chronic stable shortness of breath on exertion On nasal cannula oxygen, on 3 Liters Home generator, portable tanks  Reports he was treated with torsemide 20 daily and spironolactone 25 mg daily for 3 weeks by pulmonary for worsening leg swelling Following which he was changed back to Lasix 40 daily On the torsemide he did notice improved leg swelling Back on Lasix, swelling has recurred Occasionally takes Lasix twice daily  Weight stable 216-217 pounds Unable to afford Marcelline Deist  Lab work reviewed Total chol196 LDL 114 Declining cholesterol medication  EKG personally reviewed by myself on todays visit EKG Interpretation Date/Time:  Tuesday June 15 2023 15:55:40 EDT Ventricular Rate:  64 PR Interval:  152 QRS Duration:  90 QT Interval:  382 QTC Calculation: 394 R Axis:   -8  Text Interpretation: Normal sinus rhythm Normal ECG When compared with ECG of 06-May-2022 15:31, Premature ventricular complexes are no longer Present Premature supraventricular complexes are no longer Present ST no longer depressed in Lateral leads QT has shortened Confirmed by Julien Nordmann [id number]) on 06/15/2023 4:04:12 PM    Admission to hospital 9/23:  decompensated COPD and heart failure complicated with acute on chronic respiratory failure placed on Bipap for respiratory failure. Bronchodilator therapy  and systemic corticosteroids.  Received furosemide for diuresis.   Echo 9/23   1. Left ventricular ejection fraction, by estimation, is 55 to 60%. The  left ventricle has normal function. The left ventricle has no regional  wall motion abnormalities. Left ventricular diastolic parameters are  consistent with Grade I diastolic  dysfunction (impaired relaxation).   2. Right ventricular systolic function is normal. The right ventricular  size is normal. There is normal pulmonary artery systolic pressure. The  estimated right ventricular systolic pressure is 28.8 mmHg.   3. The mitral valve is normal in structure. No evidence of mitral valve  regurgitation. No evidence of mitral stenosis.   4. The aortic valve has been repaired/replaced. s/p TAVR. Aortic valve  regurgitation is not visualized. No aortic stenosis is present. Aortic  valve area, by VTI measures 1.45 cm. Aortic valve mean gradient measures  5.2 mmHg. Aortic valve Vmax measures  1.56 m/s.   5. The inferior vena cava is normal in size with greater than 50%  respiratory variability, suggesting right atrial pressure of 3 mmHg.   EKG personally reviewed by myself on todays visit Nsr rate 63 bpm T wave ABN  Other past medical history reviewed successful TAVR with Edwards Sapien 3 THV (size 29mm) using a transfemoral approach.   Post op echo showed EF 50-55%, normally functioning TAVR with mean gradient of 11 mm Hg and no PVL.   He was discharged on POD1 with aspirin and plavix.    pre TAVR 10/13/18 showed showed mild nonobstructive coronary disease.  showed EF 55% with normally functioning TAVR with a mean gradient of 15 mm Hg.   PMH:   has a past medical history of Arthritis, Cancer (HCC), Chronic diastolic (congestive) heart failure (HCC), COPD (chronic obstructive pulmonary disease) (HCC), Hypertension, Hyponatremia (05/06/2022), Hypothyroidism, and Severe aortic stenosis.  PSH:    Past Surgical History:  Procedure  Laterality Date   CARDIAC CATHETERIZATION     RIGHT/LEFT HEART CATH AND CORONARY ANGIOGRAPHY N/A 10/13/2018   Procedure: RIGHT/LEFT HEART CATH AND CORONARY ANGIOGRAPHY;  Surgeon: Yvonne Kendall, MD;  Location: [facility];  Service: Cardiovascular;  Laterality: N/A;   TEE WITHOUT CARDIOVERSION N/A 11/15/2018   Procedure: TRANSESOPHAGEAL ECHOCARDIOGRAM (TEE);  Surgeon: Kathleene Hazel, MD;  Location: [facility] INVAS[facility] LAB;  Service: Open Heart Surgery;  Laterality: N/A;   TRANSCATHETER AORTIC VALVE REPLACEMENT, TRANSFEMORAL  11/15/2018   TRANSCATHETER AORTIC VALVE REPLACEMENT, TRANSFEMORAL N/A 11/15/2018   Procedure: TRANSCATHETER AORTIC VALVE REPLACEMENT, TRANSFEMORAL;  Surgeon: Kathleene Hazel, MD;  Location: [facility];  Service: Open Heart Surgery;  Laterality: N/A;   VASECTOMY      Current Outpatient Medications  Medication Sig Dispense Refill   Cholecalciferol (VITAMIN D-1000 MAX ST) 25 MCG (1000 UT) tablet Take 1,000 Units by mouth daily.      furosemide (LASIX) 40 MG tablet Take 1 tablet (40 mg total) by mouth daily. 30 tablet 11   levothyroxine (SYNTHROID, LEVOTHROID) 50 MCG tablet Take 50 mcg by mouth daily before breakfast.      losartan (COZAAR) 50 MG tablet Take 1 tablet by mouth once daily 30 tablet 1   metoprolol succinate (TOPROL-XL) 25 MG 24 hr tablet Take 1 tablet by mouth once daily 30 tablet 1   mometasone-formoterol (DULERA) 200-5 MCG/ACT AERO Inhale 2 puffs into the lungs 2 (two) times daily. 3 each 3   vitamin B-12 (CYANOCOBALAMIN) 1000 MCG tablet Take 1,000 mcg by mouth daily.     albuterol (VENTOLIN HFA) 108 (90 Base) MCG/ACT inhaler Inhale 2 puffs into the lungs every 6 (six) hours as needed for wheezing or shortness of breath. (Patient not taking: Reported on 06/08/2022) 8 g 2   dapagliflozin propanediol (FARXIGA) 10 MG TABS tablet Take 1 tablet (10 mg total) by mouth daily before breakfast. (Patient not taking: Reported on 06/08/2022) 30  tablet 1   feeding supplement (ENSURE ENLIVE / ENSURE PLUS) LIQD Take 237 mLs by mouth 2 (two) times daily between meals. (Patient not taking: Reported on 06/08/2022) 237 mL 12   guaiFENesin (MUCINEX) 600 MG 12 hr tablet Take 1 tablet (600 mg total) by mouth 2 (two) times daily as needed. (Patient not taking: Reported on 06/15/2023) 30 tablet 0   tiotropium (SPIRIVA HANDIHALER) 18 MCG inhalation capsule Place 1 capsule (18 mcg total) into inhaler and inhale daily. (Patient not taking: Reported on 06/08/2022) 30 capsule 1   No current facility-administered medications for this visit.    Allergies:   Penicillins   Social History:  The patient  reports that he has quit smoking. His smoking use included cigarettes. He has a 25 pack-year smoking history. He has never used smokeless tobacco. He reports that he does not drink alcohol and does not use drugs.   Family History:   family history includes Cancer in his father.    Review of Systems: Review of Systems  Constitutional: Negative.   HENT: Negative.    Respiratory: Negative.    Cardiovascular: Negative.   Gastrointestinal: Negative.   Musculoskeletal: Negative.   Neurological:  Negative.   Psychiatric/Behavioral: Negative.    All other systems reviewed and are negative.   PHYSICAL EXAM: VS:  BP (!) 150/70 (BP Location: Left Arm, Patient Position: Sitting, Cuff Size: Normal)   Pulse 64   Ht 6\' 1"  (1.854 m)   Wt 217 lb 4 oz (98.5 kg)   SpO2 97% Comment: oxygen 3 Liters  BMI 28.66 kg/m  , BMI Body mass index is 28.66 kg/m. Constitutional:  oriented to person, place, and time. No distress.  HENT:  Head: Grossly normal Eyes:  no discharge. No scleral icterus.  Neck: No JVD, no carotid bruits  Cardiovascular: Regular rate and rhythm, no murmurs appreciated 1+ pitting edema to the mid shins bilaterally Pulmonary/Chest: Clear to auscultation bilaterally, no wheezes or rails Abdominal: Soft.  no distension.  no tenderness.   Musculoskeletal: Normal range of motion Neurological:  normal muscle tone. Coordination normal. No atrophy Skin: Skin warm and dry Psychiatric: normal affect, pleasant  Recent Labs: No results found for requested labs within last 365 days.   Lipid Panel Lab Results  Component Value Date   CHOL 117 10/14/2018   HDL 46 10/14/2018   LDLCALC 65 10/14/2018   TRIG 29 10/14/2018    Wt Readings from Last 3 Encounters:  06/15/23 217 lb 4 oz (98.5 kg)  06/08/22 216 lb (98 kg)  05/11/22 213 lb 10 oz (96.9 kg)     ASSESSMENT AND PLAN:  Problem List Items Addressed This Visit       Cardiology Problems   Hypertension   Relevant Orders   EKG 12-Lead (Completed)   Severe aortic stenosis   Relevant Orders   EKG 12-Lead (Completed)   Other Visit Diagnoses     Chronic diastolic heart failure (HCC)    -  Primary   Relevant Orders   EKG 12-Lead (Completed)   S/P AVR (aortic valve replacement)       Relevant Orders   EKG 12-Lead (Completed)   History of transcatheter aortic valve replacement (TAVR)       Relevant Orders   EKG 12-Lead (Completed)     Aortic valve stenosis/s/p TAVR Well-functioning valve September 2023  Chronic diastolic CHF Remains on Lasix,  metoprolol succinate, losartan Unable to afford Marcelline Deist Will continue Lasix 40 daily with extra Lasix in the afternoon for worsening leg swelling or weight gain Declining transition to torsemide/spironolactone On BiPAP at nighttime  COPD On 3 L nasal cannula Non-smoker Followed by pulmonary  Essential hypertension Blood pressure is well controlled on today's visit. No changes made to the medications.    Signed, Dossie Arbour, M.D., Ph.D. [facility] [place], [place] [phone]

## 2023-06-15 NOTE — Telephone Encounter (Signed)
Pt scheduled on 12/2

## 2023-07-26 ENCOUNTER — Ambulatory Visit: Payer: Medicare Other | Admitting: Cardiovascular Disease

## 2023-11-22 ENCOUNTER — Other Ambulatory Visit: Payer: Self-pay | Admitting: Cardiovascular Disease

## 2023-11-23 NOTE — Telephone Encounter (Signed)
Please advise if ok to refill or defer to PCP. Thank you so much.

## 2023-11-25 ENCOUNTER — Other Ambulatory Visit: Payer: Self-pay | Admitting: Cardiovascular Disease

## 2023-11-25 NOTE — Telephone Encounter (Signed)
*  STAT* If patient is at the pharmacy, call can be transferred to refill team.   1. Which medications need to be refilled? (please list name of each medication and dose if known) mometasone-formoterol (DULERA) 200-5 MCG/ACT AERO    2. Would you like to learn more about the convenience, safety, & potential cost savings by using the [facility]? No   3. Are you open to using the [facility] (Type [facility]. ). No   4. Which pharmacy/location (including street and city if local pharmacy) is medication to be sent to? [facility] - [place], [place] - [address]    5. Do they need a 30 day or 90 day supply? 90 day

## 2023-11-29 ENCOUNTER — Other Ambulatory Visit (HOSPITAL_COMMUNITY): Payer: Self-pay

## 2023-11-29 MED ORDER — MOMETASONE FURO-FORMOTEROL FUM 200-5 MCG/ACT IN AERO
2.0000 | INHALATION_SPRAY | Freq: Two times a day (BID) | RESPIRATORY_TRACT | 3 refills | Status: AC
  Filled 2023-11-29: qty 13, 30d supply, fill #0

## 2023-11-29 NOTE — Telephone Encounter (Signed)
 Patient came by office States that refill has not been received Please resend

## 2023-12-09 ENCOUNTER — Other Ambulatory Visit (HOSPITAL_COMMUNITY): Payer: Self-pay

## 2023-12-15 ENCOUNTER — Encounter: Payer: Self-pay | Admitting: Nurse Practitioner

## 2023-12-15 ENCOUNTER — Ambulatory Visit: Payer: Medicare Other | Attending: Nurse Practitioner | Admitting: Nurse Practitioner

## 2023-12-15 VITALS — BP 134/70 | HR 54 | Ht 73.0 in | Wt 211.2 lb

## 2023-12-15 DIAGNOSIS — Z952 Presence of prosthetic heart valve: Secondary | ICD-10-CM | POA: Diagnosis present

## 2023-12-15 DIAGNOSIS — I10 Essential (primary) hypertension: Secondary | ICD-10-CM | POA: Insufficient documentation

## 2023-12-15 DIAGNOSIS — J449 Chronic obstructive pulmonary disease, unspecified: Secondary | ICD-10-CM | POA: Diagnosis present

## 2023-12-15 DIAGNOSIS — I2510 Atherosclerotic heart disease of native coronary artery without angina pectoris: Secondary | ICD-10-CM | POA: Diagnosis present

## 2023-12-15 DIAGNOSIS — I5032 Chronic diastolic (congestive) heart failure: Secondary | ICD-10-CM | POA: Insufficient documentation

## 2023-12-15 MED ORDER — FUROSEMIDE 40 MG PO TABS: 40.0000 mg | ORAL_TABLET | Freq: Every day | ORAL | 5 refills | Status: AC

## 2023-12-15 NOTE — Patient Instructions (Signed)
 Medication Instructions:  No changes *If you need a refill on your cardiac medications before your next appointment, please call your pharmacy*  Lab Work: None ordered If you have labs (blood work) drawn today and your tests are completely normal, you will receive your results only by: MyChart Message (if you have MyChart) OR A paper copy in the mail If you have any lab test that is abnormal or we need to change your treatment, we will call you to review the results.  Testing/Procedures: None ordered  Follow-Up: At [facility], you and your health needs are our priority.  As part of our continuing mission to provide you with exceptional heart care, our providers are all part of one team.  This team includes your primary Cardiologist (physician) and Advanced Practice Providers or APPs (Physician Assistants and Nurse Practitioners) who all work together to provide you with the care you need, when you need it.  Your next appointment:   6 month(s)  Provider:   You may see Timothy Gollan, MD or one of the following Advanced Practice Providers on your designated Care Team:   Laneta Pintos, NP   We recommend signing up for the patient portal called "MyChart".  Sign up information is provided on this After Visit Summary.  MyChart is used to connect with patients for Virtual Visits (Telemedicine).  Patients are able to view lab/test results, encounter notes, upcoming appointments, etc.  Non-urgent messages can be sent to your provider as well.   To learn more about what you can do with MyChart, go to [email or web address].

## 2023-12-15 NOTE — Progress Notes (Signed)
 Office Visit    Patient Name: Kenneth Larsen Date of Encounter: 12/15/2023  Primary Care Provider:  Aneita Baptise, PA Primary Cardiologist:  Belva Boyden, MD  Chief Complaint    78 y.o. 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  Past Medical History  Subjective   Past Medical History:  Diagnosis Date   Arthritis    CAD (coronary artery disease)    a. 09/2023 Cath: LM 15d, LAD 40p, LCX 9m, OM1 min irregs, RCA 40p, 30d.   Cancer (HCC)    2010-melanoma   Chronic diastolic (congestive) heart failure (HCC)    COPD (chronic obstructive pulmonary disease) (HCC)    Hypertension    Hyponatremia 05/06/2022   Hypothyroidism    Severe aortic stenosis    a. 10/2018 s/p TAVR w/ Edwards Sapien 3 29 mm AoV; b. 04/2022 Echo: EF 55-60%, no rwma, Gri DD, nl RV fxn, RVSP 28.24mmHg. No AS/AI.   Past Surgical History:  Procedure Laterality Date   CARDIAC CATHETERIZATION     RIGHT/LEFT HEART CATH AND CORONARY ANGIOGRAPHY N/A 10/13/2018   Procedure: RIGHT/LEFT HEART CATH AND CORONARY ANGIOGRAPHY;  Surgeon: Sammy Crisp, MD;  Location: [facility] INVASIVE CV LAB;  Service: Cardiovascular;  Laterality: N/A;   TEE WITHOUT CARDIOVERSION N/A 11/15/2018   Procedure: TRANSESOPHAGEAL ECHOCARDIOGRAM (TEE);  Surgeon: Odie Benne, MD;  Location: [facility] INVASIVE CV LAB;  Service: Open Heart Surgery;  Laterality: N/A;   TRANSCATHETER AORTIC VALVE REPLACEMENT, TRANSFEMORAL  11/15/2018   TRANSCATHETER AORTIC VALVE REPLACEMENT, TRANSFEMORAL N/A 11/15/2018   Procedure: TRANSCATHETER AORTIC VALVE REPLACEMENT, TRANSFEMORAL;  Surgeon: Odie Benne, MD;  Location: [facility] INVASIVE CV LAB;  Service: Open Heart Surgery;  Laterality: N/A;   VASECTOMY      Allergies  Allergies  Allergen Reactions   Penicillins Hives    Did it  involve swelling of the face/tongue/throat, SOB, or low BP? No Did it involve sudden or severe rash/hives, skin peeling, or any reaction on the inside of your mouth or nose? No Did you need to seek medical attention at a hospital or doctor's office? No When did it last happen?      childhood allergy If all above answers are "NO", may proceed with cephalosporin use.       History of Present Illness      78 y.o. 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  In the setting of severe aortic stenosis, he underwent diagnostic catheterization in February 2020, showing nonobstructive CAD.  He subsequently underwent TAVR.  Most recent echo in September 2023 showed normal LV function without AAS or AI.   He was last seen in cardiology clinic October 2024 following an exacerbation of lower extremity edema, which was managed with torsemide prior to transition back to Lasix .  At that time, he preferred to remain on Lasix  40 mg daily with additional doses as needed.  About 2 months ago, he had cold symptoms and noted increasing lower extremity swelling.  For a few days, he required additional doses of Lasix .  Since then, things seem to have stabilized.  His weight was 213 pounds at recent pulmonology visit, and is 211 pounds today.  He was 216 pounds when seen here in October.  He  notes chronic bilateral left greater than right ankle swelling (2+ today).  He wears oxygen around-the-clock and overall feels as though his breathing has been pretty good.  He does not typically experience dyspnea with usual activity.  He denies chest pain, palpitations, PND, orthopnea, dizziness, syncope, edema, or early satiety. Objective  Home Medications    Current Outpatient Medications  Medication Sig Dispense Refill   Cholecalciferol  (VITAMIN D -1000 MAX ST) 25 MCG (1000 UT) tablet Take 1,000 Units by  mouth daily.      furosemide  (LASIX ) 40 MG tablet Take 1 tablet (40 mg total) by mouth daily. May take an extra 1 tablet ( 40 MG ) daily as needed for leg swelling. 180 tablet 3   levothyroxine  (SYNTHROID , LEVOTHROID) 50 MCG tablet Take 50 mcg by mouth daily before breakfast.      losartan  (COZAAR ) 50 MG tablet Take 1 tablet (50 mg total) by mouth daily. 90 tablet 3   metoprolol  succinate (TOPROL -XL) 25 MG 24 hr tablet Take 1 tablet (25 mg total) by mouth daily. 90 tablet 3   mometasone -formoterol  (DULERA ) 200-5 MCG/ACT AERO Inhale 2 puffs into the lungs 2 (two) times daily. 39 g 0   vitamin B-12 (CYANOCOBALAMIN ) 1000 MCG tablet Take 1,000 mcg by mouth daily.     albuterol  (VENTOLIN  HFA) 108 (90 Base) MCG/ACT inhaler Inhale 2 puffs into the lungs every 6 (six) hours as needed for wheezing or shortness of breath. (Patient not taking: Reported on 12/15/2023) 8 g 2   feeding supplement (ENSURE ENLIVE / ENSURE PLUS) LIQD Take 237 mLs by mouth 2 (two) times daily between meals. (Patient not taking: Reported on 12/15/2023) 237 mL 12   guaiFENesin  (MUCINEX ) 600 MG 12 hr tablet Take 1 tablet (600 mg total) by mouth 2 (two) times daily as needed. (Patient not taking: Reported on 12/15/2023) 30 tablet 0   mometasone -formoterol  (DULERA ) 200-5 MCG/ACT AERO Inhale 2 puffs into the lungs 2 (two) times daily. (Patient not taking: Reported on 12/15/2023) 39 g 3   tiotropium (SPIRIVA  HANDIHALER) 18 MCG inhalation capsule Place 1 capsule (18 mcg total) into inhaler and inhale daily. (Patient not taking: Reported on 12/15/2023) 30 capsule 1   No current facility-administered medications for this visit.     Physical Exam    VS:  BP (!) 140/60 (BP Location: Left Arm, Patient Position: Sitting, Cuff Size: Normal)   Pulse (!) 54   Ht 6\' 1"  (1.854 m)   Wt 211 lb 4 oz (95.8 kg)   SpO2 96% Comment: oxygen @ 3 Liters  BMI 27.87 kg/m  , BMI Body mass index is 27.87 kg/m.     Vitals:   12/15/23 1021 12/15/23 1103  BP:  (!) 140/60 134/70  Pulse: (!) 54   SpO2: 96%       GEN: Well nourished, well developed, in no acute distress. HEENT: normal. Neck: Supple, no JVD, carotid bruits, or masses. Cardiac: RRR, distant, no murmurs, rubs, or gallops. No clubbing, cyanosis, 2+ bilateral ankle edema.  Radials 2+/PT 2+ and equal bilaterally.  Respiratory:  Respirations regular and unlabored, diminished breath sounds bilaterally. GI: Soft, nontender, nondistended, BS + x 4. MS: no deformity or atrophy. Skin: warm and dry, no rash. Neuro:  Strength and sensation are intact. Psych: Normal affect.  Accessory Clinical Findings    ECG personally reviewed by me today - EKG Interpretation Date/Time:  Wednesday December 15 2023 10:30:17 EDT Ventricular Rate:  54 PR Interval:  132 QRS Duration:  88 QT Interval:  404 QTC Calculation: 383 R Axis:   -7  Text Interpretation: Sinus bradycardia with sinus arrhythmia Confirmed by Laneta Pintos [phone]) on 12/15/2023 10:39:49 AM   - no acute changes.  Labs dated July 27, 2023 from Care Everywhere:  Sodium 138, potassium 4.6, chloride 96, CO2 39.4, BUN 17, creatinine 0.9, glucose 82 Calcium  8.9, albumin 3.8, total protein 5.8 Total bilirubin 0.8, alkaline phosphatase 60, AST 14, ALT 8.    Assessment & Plan    1.  Aortic stenosis status post TAVR: Status post TAVR in March 2020.  Echo in September 2023 without any AS or AI, and normal LV function.  He denies chest pain, presyncope, or syncope.  Chronic, stable dyspnea on exertion on home O2.  2.  Chronic HFpEF: Patient required extra Lasix  in several days earlier this year.  He notes chronic bilateral ankle edema which she says is stable today, though it is 2+ on exam.  His weight is down from 216 in October to 211 today.  I suspect his dry weight is likely something closer to 205.  He has yet to take his Lasix  this morning, as he did not want to take it before coming here.  He also sometimes misses it on Sundays, due  to church, and then forgets to take it in the afternoon.  He will go home and take Lasix  40 mg today.  I advised that if he has ongoing ankle swelling in the morning, he should take 2 doses tomorrow.  I further advised that if he is regularly taking a second dose, that he is to notify us  so that we may follow-up lab work, which was stable in December 2024.  Heart rate and blood pressure stable today.  Continue beta-blocker and ARB.  3.  Primary hypertension: Blood pressure initially elevated at 140/60.  Improved at 134/70.  He has yet to take his morning dose of Lasix  which he will do this morning.  4.  Nonobstructive CAD: Nonobstructive disease by catheterization in 2020.  He is not on a statin.  Lipids have been followed by primary care historically.  He has an appoint with primary care in the coming months.  In the setting of history of nonobstructive CAD, would strongly recommend addition of statin therapy if LDL greater than 70 (was 65 in February 2020).  5.  COPD: Stable on home O2.  Followed closely by pulmonology.  6.  Disposition: Follow-up in 6 months or sooner if necessary.  Laneta Pintos, NP 12/15/2023, 10:40 AM

## 2023-12-21 ENCOUNTER — Other Ambulatory Visit: Payer: Self-pay

## 2023-12-21 ENCOUNTER — Telehealth: Payer: Self-pay | Admitting: Nurse Practitioner

## 2023-12-21 DIAGNOSIS — Z79899 Other long term (current) drug therapy: Secondary | ICD-10-CM

## 2023-12-21 MED ORDER — ATORVASTATIN CALCIUM 40 MG PO TABS: 40.0000 mg | ORAL_TABLET | Freq: Every day | ORAL | 3 refills | Status: DC

## 2023-12-21 NOTE — Telephone Encounter (Signed)
 Pt is requesting cb to further discuss labs and new medication

## 2023-12-22 NOTE — Telephone Encounter (Signed)
 Returned call to patient. Patient calling in regards to the following from Laneta Pintos, NP.   LDL cholesterol is higher than we'd like.  With his history of mild coronary artery disease, would like to see his LDL <70 (currently 114).  Provided that there is no prior intolerance (none listed), I recommend starting atorvastatin  40 mg daily w/ plan for follow-up lipids and lft's in 6-8 wks.   Patient states that his last labs were in July 2024 and that he is due to see his PCP in July. Patient states that he will have labs drawn at that time. Patient states that he does not want to start medication at this time he wants to wait until he has updated labs drawn.

## 2024-05-18 ENCOUNTER — Other Ambulatory Visit: Payer: Self-pay

## 2024-05-18 ENCOUNTER — Emergency Department

## 2024-05-18 ENCOUNTER — Inpatient Hospital Stay
Admission: EM | Admit: 2024-05-18 | Discharge: 2024-05-23 | DRG: 444 | Disposition: A | Discharge status: Home / Self Care | Attending: Internal Medicine | Admitting: Internal Medicine

## 2024-05-18 DIAGNOSIS — Z7951 Long term (current) use of inhaled steroids: Secondary | ICD-10-CM

## 2024-05-18 DIAGNOSIS — E66811 Obesity, class 1: Secondary | ICD-10-CM | POA: Diagnosis present

## 2024-05-18 DIAGNOSIS — E039 Hypothyroidism, unspecified: Secondary | ICD-10-CM | POA: Diagnosis present

## 2024-05-18 DIAGNOSIS — Z880 Allergy status to penicillin: Secondary | ICD-10-CM

## 2024-05-18 DIAGNOSIS — I151 Hypertension secondary to other renal disorders: Secondary | ICD-10-CM

## 2024-05-18 DIAGNOSIS — I5032 Chronic diastolic (congestive) heart failure: Secondary | ICD-10-CM | POA: Diagnosis present

## 2024-05-18 DIAGNOSIS — I110 Hypertensive heart disease with heart failure: Secondary | ICD-10-CM | POA: Diagnosis present

## 2024-05-18 DIAGNOSIS — Z809 Family history of malignant neoplasm, unspecified: Secondary | ICD-10-CM

## 2024-05-18 DIAGNOSIS — J449 Chronic obstructive pulmonary disease, unspecified: Secondary | ICD-10-CM | POA: Diagnosis present

## 2024-05-18 DIAGNOSIS — R188 Other ascites: Secondary | ICD-10-CM | POA: Diagnosis present

## 2024-05-18 DIAGNOSIS — Z952 Presence of prosthetic heart valve: Secondary | ICD-10-CM | POA: Diagnosis not present

## 2024-05-18 DIAGNOSIS — K8000 Calculus of gallbladder with acute cholecystitis without obstruction: Secondary | ICD-10-CM | POA: Diagnosis present

## 2024-05-18 DIAGNOSIS — F17210 Nicotine dependence, cigarettes, uncomplicated: Secondary | ICD-10-CM | POA: Diagnosis present

## 2024-05-18 DIAGNOSIS — N179 Acute kidney failure, unspecified: Secondary | ICD-10-CM | POA: Diagnosis not present

## 2024-05-18 DIAGNOSIS — Z79899 Other long term (current) drug therapy: Secondary | ICD-10-CM | POA: Diagnosis not present

## 2024-05-18 DIAGNOSIS — G4733 Obstructive sleep apnea (adult) (pediatric): Secondary | ICD-10-CM | POA: Diagnosis present

## 2024-05-18 DIAGNOSIS — M1990 Unspecified osteoarthritis, unspecified site: Secondary | ICD-10-CM | POA: Diagnosis present

## 2024-05-18 DIAGNOSIS — B3789 Other sites of candidiasis: Secondary | ICD-10-CM | POA: Diagnosis not present

## 2024-05-18 DIAGNOSIS — Z66 Do not resuscitate: Secondary | ICD-10-CM | POA: Diagnosis present

## 2024-05-18 DIAGNOSIS — I10 Essential (primary) hypertension: Secondary | ICD-10-CM | POA: Diagnosis present

## 2024-05-18 DIAGNOSIS — Z79890 Hormone replacement therapy (postmenopausal): Secondary | ICD-10-CM | POA: Diagnosis not present

## 2024-05-18 DIAGNOSIS — Z85820 Personal history of malignant melanoma of skin: Secondary | ICD-10-CM | POA: Diagnosis not present

## 2024-05-18 DIAGNOSIS — Z9981 Dependence on supplemental oxygen: Secondary | ICD-10-CM | POA: Diagnosis not present

## 2024-05-18 DIAGNOSIS — Z6828 Body mass index (BMI) 28.0-28.9, adult: Secondary | ICD-10-CM

## 2024-05-18 DIAGNOSIS — J9611 Chronic respiratory failure with hypoxia: Secondary | ICD-10-CM

## 2024-05-18 DIAGNOSIS — R1011 Right upper quadrant pain: Secondary | ICD-10-CM | POA: Diagnosis present

## 2024-05-18 DIAGNOSIS — K810 Acute cholecystitis: Principal | ICD-10-CM | POA: Diagnosis present

## 2024-05-18 DIAGNOSIS — K82A2 Perforation of gallbladder in cholecystitis: Secondary | ICD-10-CM | POA: Diagnosis present

## 2024-05-18 DIAGNOSIS — J9621 Acute and chronic respiratory failure with hypoxia: Secondary | ICD-10-CM | POA: Diagnosis present

## 2024-05-18 DIAGNOSIS — I2510 Atherosclerotic heart disease of native coronary artery without angina pectoris: Secondary | ICD-10-CM | POA: Diagnosis present

## 2024-05-18 DIAGNOSIS — D6489 Other specified anemias: Secondary | ICD-10-CM | POA: Diagnosis present

## 2024-05-18 HISTORY — DX: Hypoxemia: R09.02

## 2024-05-18 LAB — URINALYSIS, ROUTINE W REFLEX MICROSCOPIC
Bilirubin Urine: NEGATIVE
Glucose, UA: NEGATIVE mg/dL
Hgb urine dipstick: NEGATIVE
Ketones, ur: NEGATIVE mg/dL
Leukocytes,Ua: NEGATIVE
Nitrite: NEGATIVE
Protein, ur: 30 mg/dL — AB
Specific Gravity, Urine: 1.023 (ref 1.005–1.030)
Squamous Epithelial / HPF: 0 /HPF (ref 0–5)
pH: 5 (ref 5.0–8.0)

## 2024-05-18 LAB — CBC
HCT: 38.9 % — ABNORMAL LOW (ref 39.0–52.0)
Hemoglobin: 12.4 g/dL — ABNORMAL LOW (ref 13.0–17.0)
MCH: 30 pg (ref 26.0–34.0)
MCHC: 31.9 g/dL (ref 30.0–36.0)
MCV: 94 fL (ref 80.0–100.0)
Platelets: 247 K/uL (ref 150–400)
RBC: 4.14 MIL/uL — ABNORMAL LOW (ref 4.22–5.81)
RDW: 13.3 % (ref 11.5–15.5)
WBC: 17.9 K/uL — ABNORMAL HIGH (ref 4.0–10.5)
nRBC: 0 % (ref 0.0–0.2)

## 2024-05-18 LAB — COMPREHENSIVE METABOLIC PANEL WITH GFR
ALT: 9 U/L (ref 0–44)
AST: 20 U/L (ref 15–41)
Albumin: 3.3 g/dL — ABNORMAL LOW (ref 3.5–5.0)
Alkaline Phosphatase: 44 U/L (ref 38–126)
Anion gap: 12 (ref 5–15)
BUN: 25 mg/dL — ABNORMAL HIGH (ref 8–23)
CO2: 29 mmol/L (ref 22–32)
Calcium: 8.4 mg/dL — ABNORMAL LOW (ref 8.9–10.3)
Chloride: 92 mmol/L — ABNORMAL LOW (ref 98–111)
Creatinine, Ser: 1.18 mg/dL (ref 0.61–1.24)
GFR, Estimated: >60 mL/min (ref >60)
Glucose, Bld: 138 mg/dL — ABNORMAL HIGH (ref 70–99)
Potassium: 4 mmol/L (ref 3.5–5.1)
Sodium: 133 mmol/L — ABNORMAL LOW (ref 135–145)
Total Bilirubin: 2.2 mg/dL — ABNORMAL HIGH (ref 0.0–1.2)
Total Protein: 6.2 g/dL — ABNORMAL LOW (ref 6.5–8.1)

## 2024-05-18 LAB — LIPASE, BLOOD: Lipase: 12 U/L (ref 11–51)

## 2024-05-18 MED ORDER — CIPROFLOXACIN IN D5W 400 MG/200ML IV SOLN
400.0000 mg | Freq: Two times a day (BID) | INTRAVENOUS | Status: DC
  Administered 2024-05-19 – 2024-05-22 (×7): 400 mg via INTRAVENOUS
  Filled 2024-05-18 (×7): qty 200

## 2024-05-18 MED ORDER — METOPROLOL SUCCINATE ER 25 MG PO TB24
25.0000 mg | ORAL_TABLET | Freq: Every day | ORAL | Status: DC
  Administered 2024-05-19 – 2024-05-23 (×5): 25 mg via ORAL
  Filled 2024-05-18 (×5): qty 1

## 2024-05-18 MED ORDER — ATORVASTATIN CALCIUM 20 MG PO TABS
40.0000 mg | ORAL_TABLET | Freq: Every day | ORAL | Status: DC
  Administered 2024-05-19 – 2024-05-22 (×2): 40 mg via ORAL
  Filled 2024-05-18 (×4): qty 2

## 2024-05-18 MED ORDER — CIPROFLOXACIN IN D5W 400 MG/200ML IV SOLN
400.0000 mg | Freq: Once | INTRAVENOUS | Status: AC
  Administered 2024-05-18: 400 mg via INTRAVENOUS
  Filled 2024-05-18: qty 200

## 2024-05-18 MED ORDER — POLYETHYLENE GLYCOL 3350 17 G PO PACK: 17.0000 g | PACK | Freq: Every day | ORAL | Status: DC | PRN

## 2024-05-18 MED ORDER — LOSARTAN POTASSIUM 50 MG PO TABS
50.0000 mg | ORAL_TABLET | Freq: Every day | ORAL | Status: DC
  Administered 2024-05-19 – 2024-05-23 (×5): 50 mg via ORAL
  Filled 2024-05-18 (×5): qty 1

## 2024-05-18 MED ORDER — FUROSEMIDE 40 MG PO TABS
40.0000 mg | ORAL_TABLET | Freq: Every day | ORAL | Status: DC
Start: 2024-05-19 — End: 2024-05-20
  Administered 2024-05-19 – 2024-05-20 (×2): 40 mg via ORAL
  Filled 2024-05-18 (×2): qty 1

## 2024-05-18 MED ORDER — FLUTICASONE FUROATE-VILANTEROL 200-25 MCG/ACT IN AEPB
1.0000 | INHALATION_SPRAY | Freq: Every day | RESPIRATORY_TRACT | Status: DC
Start: 2024-05-19 — End: 2024-05-23
  Administered 2024-05-19 – 2024-05-23 (×5): 1 via RESPIRATORY_TRACT
  Filled 2024-05-18: qty 28

## 2024-05-18 MED ORDER — METRONIDAZOLE 500 MG/100ML IV SOLN
500.0000 mg | Freq: Once | INTRAVENOUS | Status: AC
  Administered 2024-05-18: 500 mg via INTRAVENOUS
  Filled 2024-05-18: qty 100

## 2024-05-18 MED ORDER — MORPHINE SULFATE (PF) 4 MG/ML IV SOLN
4.0000 mg | Freq: Once | INTRAVENOUS | Status: AC
  Administered 2024-05-18: 4 mg via INTRAVENOUS
  Filled 2024-05-18: qty 1

## 2024-05-18 MED ORDER — SODIUM CHLORIDE 0.9% FLUSH
3.0000 mL | Freq: Two times a day (BID) | INTRAVENOUS | Status: DC
  Administered 2024-05-18 – 2024-05-22 (×8): 3 mL via INTRAVENOUS

## 2024-05-18 MED ORDER — HYDROMORPHONE HCL 1 MG/ML IJ SOLN: 0.5000 mg | INTRAMUSCULAR | Status: DC | PRN

## 2024-05-18 MED ORDER — LEVOTHYROXINE SODIUM 50 MCG PO TABS
50.0000 ug | ORAL_TABLET | Freq: Every day | ORAL | Status: DC
  Administered 2024-05-19 – 2024-05-23 (×5): 50 ug via ORAL
  Filled 2024-05-18 (×5): qty 1

## 2024-05-18 MED ORDER — ACETAMINOPHEN 650 MG RE SUPP: 650.0000 mg | Freq: Four times a day (QID) | RECTAL | Status: DC | PRN

## 2024-05-18 MED ORDER — HYDROCODONE-ACETAMINOPHEN 5-325 MG PO TABS
1.0000 | ORAL_TABLET | ORAL | Status: DC | PRN
  Filled 2024-05-18 (×2): qty 1

## 2024-05-18 MED ORDER — ONDANSETRON HCL 4 MG/2ML IJ SOLN
4.0000 mg | Freq: Once | INTRAMUSCULAR | Status: AC
  Administered 2024-05-18: 4 mg via INTRAVENOUS
  Filled 2024-05-18: qty 2

## 2024-05-18 MED ORDER — METRONIDAZOLE 500 MG/100ML IV SOLN
500.0000 mg | Freq: Two times a day (BID) | INTRAVENOUS | Status: DC
  Administered 2024-05-18 – 2024-05-22 (×8): 500 mg via INTRAVENOUS
  Filled 2024-05-18 (×8): qty 100

## 2024-05-18 MED ORDER — ACETAMINOPHEN 325 MG PO TABS
650.0000 mg | ORAL_TABLET | Freq: Four times a day (QID) | ORAL | Status: DC | PRN
  Administered 2024-05-18: 650 mg via ORAL
  Filled 2024-05-18: qty 2

## 2024-05-18 MED ORDER — SODIUM CHLORIDE 0.9 % IV BOLUS
500.0000 mL | Freq: Once | INTRAVENOUS | Status: AC
  Administered 2024-05-18: 500 mL via INTRAVENOUS

## 2024-05-18 NOTE — H&P (Signed)
 History and Physical   [name] FMW:[id number] DOB: 06/18/1946 DOA: 05/18/2024  PCP: [facility]   Patient coming from: Home  Chief Complaint: Abdominal pain  HPI: Kenneth Larsen is a 78 y.o. male with medical history significant of hypertension, hypothyroidism, severe aortic stenosis status post TAVR, chronic diastolic CHF, obesity, COPD, chronic respiratory failure with hypoxia respiratory failure on 2-3L presenting with abdominal pain.  Patient reports 3 days of right upper quadrant pain.  Patient denies fevers, chills, chest pain, shortness of breath, constipation, diarrhea, nausea, vomiting.  ED Course: Vital signs in ED stable.  Lab workup notable for CMP with sodium 133, chloride 92, BUN 25, glucose 138, calcium  8.4, protein 6.2, albumin 3.3, T. bili 2.2.  CBC with leukocytosis to 17.9, hemoglobin stable at 12.4.  Lipase normal.  Urinalysis with protein and rare bacteria only.  Right upper quadrant ultrasound showed gallstones and changes consistent with acute cholecystitis.  Nonspecific liver changes also noted that could be consistent with cirrhosis.  Patient sees ciprofloxacin , Flagyl  in the ED.  Also received morphine , 500 cc IV fluid, Zofran .  General surgery consulted and are following.  Review of Systems: As per HPI otherwise all other systems reviewed and are negative.  Past Medical History:  Diagnosis Date   Acute respiratory failure with hypoxia (HCC) 05/07/2022   Arthritis    CAD (coronary artery disease)    a. 09/2023 Cath: LM 15d, LAD 40p, LCX 73m, OM1 min irregs, RCA 40p, 30d.   Cancer (HCC)    2010-melanoma   Chronic diastolic (congestive) heart failure (HCC)    COPD (chronic obstructive pulmonary disease) (HCC)    Elevated troponin 05/06/2022   Hypertension    Hyponatremia 05/06/2022   Hypothyroidism    Hypoxemia    Respiratory failure (HCC) 10/08/2018   Severe aortic stenosis    a. 10/2018 s/p TAVR w/ Edwards Sapien 3 29 mm  AoV; b. 04/2022 Echo: EF 55-60%, no rwma, Gri DD, nl RV fxn, RVSP 28.31mmHg. No AS/AI.    Past Surgical History:  Procedure Laterality Date   CARDIAC CATHETERIZATION     RIGHT/LEFT HEART CATH AND CORONARY ANGIOGRAPHY N/A 10/13/2018   Procedure: RIGHT/LEFT HEART CATH AND CORONARY ANGIOGRAPHY;  Surgeon: Mady Bruckner, MD;  Location: [facility] INVASIVE CV LAB;  Service: Cardiovascular;  Laterality: N/A;   TEE WITHOUT CARDIOVERSION N/A 11/15/2018   Procedure: TRANSESOPHAGEAL ECHOCARDIOGRAM (TEE);  Surgeon: Verlin Bruckner BIRCH, MD;  Location: [facility] INVASIVE CV LAB;  Service: Open Heart Surgery;  Laterality: N/A;   TRANSCATHETER AORTIC VALVE REPLACEMENT, TRANSFEMORAL  11/15/2018   TRANSCATHETER AORTIC VALVE REPLACEMENT, TRANSFEMORAL N/A 11/15/2018   Procedure: TRANSCATHETER AORTIC VALVE REPLACEMENT, TRANSFEMORAL;  Surgeon: Verlin Bruckner BIRCH, MD;  Location: [facility] INVASIVE CV LAB;  Service: Open Heart Surgery;  Laterality: N/A;   VASECTOMY      Social History  reports that he has quit smoking. His smoking use included cigarettes. He has a 25 pack-year smoking history. He has never used smokeless tobacco. He reports that he does not drink alcohol and does not use drugs.  Allergies  Allergen Reactions   Penicillins Hives    Did it involve swelling of the face/tongue/throat, SOB, or low BP? No Did it involve sudden or severe rash/hives, skin peeling, or any reaction on the inside of your mouth or nose? No Did you need to seek medical attention at a hospital or doctor's office? No When did it last happen?      childhood allergy If all above answers are  NO, may proceed with cephalosporin use.     Family History  Problem Relation Age of Onset   Cancer Father   Reviewed on admission  Prior to Admission medications   Medication Sig Start Date End Date Taking? Authorizing Provider  albuterol  (VENTOLIN  HFA) 108 (90 Base) MCG/ACT inhaler Inhale 2 puffs into the lungs every 6 (six) hours as needed  for wheezing or shortness of breath. Patient not taking: Reported on 12/15/2023 05/12/22   Fausto Burnard LABOR, DO  atorvastatin  (LIPITOR) 40 MG tablet Take 1 tablet (40 mg total) by mouth daily. 12/21/23 03/20/24  Vivienne Lonni Ingle, NP  Cholecalciferol  (VITAMIN D -1000 MAX ST) 25 MCG (1000 UT) tablet Take 1,000 Units by mouth daily.     [provider]  feeding supplement (ENSURE ENLIVE / ENSURE PLUS) LIQD Take 237 mLs by mouth 2 (two) times daily between meals. Patient not taking: Reported on 12/15/2023 05/12/22   Fausto Burnard A, DO  furosemide  (LASIX ) 40 MG tablet Take 1 tablet (40 mg total) by mouth daily. May take an extra 1 tablet ( 40 MG ) daily as needed for leg swelling. 12/15/23 12/14/24  Vivienne Lonni Ingle, NP  guaiFENesin  (MUCINEX ) 600 MG 12 hr tablet Take 1 tablet (600 mg total) by mouth 2 (two) times daily as needed. Patient not taking: Reported on 12/15/2023 05/12/22   Fausto Burnard A, DO  levothyroxine  (SYNTHROID , LEVOTHROID) 50 MCG tablet Take 50 mcg by mouth daily before breakfast.     [provider]  losartan  (COZAAR ) 50 MG tablet Take 1 tablet (50 mg total) by mouth daily. 06/15/23   Gollan, Timothy J, MD  metoprolol  succinate (TOPROL -XL) 25 MG 24 hr tablet Take 1 tablet (25 mg total) by mouth daily. 06/15/23   Gollan, Timothy J, MD  mometasone -formoterol  (DULERA ) 200-5 MCG/ACT AERO Inhale 2 puffs into the lungs 2 (two) times daily. 11/29/23   Gollan, Timothy J, MD  mometasone -formoterol  (DULERA ) 200-5 MCG/ACT AERO Inhale 2 puffs into the lungs 2 (two) times daily. Patient not taking: Reported on 12/15/2023 11/29/23   Gollan, Timothy J, MD  tiotropium (SPIRIVA  HANDIHALER) 18 MCG inhalation capsule Place 1 capsule (18 mcg total) into inhaler and inhale daily. Patient not taking: Reported on 12/15/2023 05/12/22 12/15/23  Fausto Burnard A, DO  vitamin B-12 (CYANOCOBALAMIN ) 1000 MCG tablet Take 1,000 mcg by mouth daily.    [provider]    Physical  Exam: Vitals:   05/18/24 1007 05/18/24 1008 05/18/24 1419  BP: (!) 122/57  122/62  Pulse: 92  94  Resp: 20  18  Temp: 98.4 F (36.9 C)  98.1 F (36.7 C)  TempSrc:   Oral  SpO2: 99%  99%  Weight:  91.6 kg   Height:  6' 1 (1.854 m)     Physical Exam Constitutional:      General: He is not in acute distress.    Appearance: Normal appearance.  HENT:     Head: Normocephalic and atraumatic.     Mouth/Throat:     Mouth: Mucous membranes are moist.     Pharynx: Oropharynx is clear.  Eyes:     Extraocular Movements: Extraocular movements intact.     Pupils: Pupils are equal, round, and reactive to light.  Cardiovascular:     Rate and Rhythm: Normal rate and regular rhythm.     Pulses: Normal pulses.     Heart sounds: Normal heart sounds.  Pulmonary:     Effort: Pulmonary effort is normal. No respiratory distress.  Breath sounds: Normal breath sounds.  Abdominal:     General: Bowel sounds are normal. There is no distension.     Palpations: Abdomen is soft.     Tenderness: There is abdominal tenderness.  Musculoskeletal:        General: No swelling or deformity.  Skin:    General: Skin is warm and dry.  Neurological:     General: No focal deficit present.     Mental Status: Mental status is at baseline.    Labs on Admission: I have personally reviewed following labs and imaging studies  CBC: Recent Labs  Lab 05/18/24 1010  WBC 17.9*  HGB 12.4*  HCT 38.9*  MCV 94.0  PLT 247    Basic Metabolic Panel: Recent Labs  Lab 05/18/24 1010  NA 133*  K 4.0  CL 92*  CO2 29  GLUCOSE 138*  BUN 25*  CREATININE 1.18  CALCIUM  8.4*    GFR: Estimated Creatinine Clearance: 57.4 mL/min (by C-G formula based on SCr of 1.18 mg/dL).  Liver Function Tests: Recent Labs  Lab 05/18/24 1010  AST 20  ALT 9  ALKPHOS 44  BILITOT 2.2*  PROT 6.2*  ALBUMIN 3.3*    Urine analysis:    Component Value Date/Time   COLORURINE AMBER (A) 05/18/2024 1009   APPEARANCEUR CLEAR  (A) 05/18/2024 1009   LABSPEC 1.023 05/18/2024 1009   PHURINE 5.0 05/18/2024 1009   GLUCOSEU NEGATIVE 05/18/2024 1009   HGBUR NEGATIVE 05/18/2024 1009   BILIRUBINUR NEGATIVE 05/18/2024 1009   KETONESUR NEGATIVE 05/18/2024 1009   PROTEINUR 30 (A) 05/18/2024 1009   NITRITE NEGATIVE 05/18/2024 1009   LEUKOCYTESUR NEGATIVE 05/18/2024 1009    Radiological Exams on Admission: US  Abdomen Limited RUQ (LIVER/GB) Result Date: 05/18/2024 CLINICAL DATA:  Upper abdominal pain. EXAM: ULTRASOUND ABDOMEN LIMITED RIGHT UPPER QUADRANT COMPARISON:  Chest CTA dated 05/06/2022 FINDINGS: Gallbladder: Multiple gallstones layering in the dependent portion of the gallbladder measuring up to 8 mm in maximum diameter each. Gallbladder wall thickening with a maximum thickness of 4.9 mm. Small amount of pericholecystic fluid. Positive sonographic Murphy sign. Common bile duct: Diameter: 2.5 mm Liver: Diffusely echogenic and heterogeneous with lobulated contours. Portal vein is patent on color Doppler imaging with normal direction of blood flow towards the liver. Other: None. IMPRESSION: 1. Cholelithiasis with gallbladder wall thickening, pericholecystic fluid and positive sonographic Murphy sign compatible with acute cholecystitis. 2. Diffusely echogenic and heterogeneous liver with lobulated contours. This is nonspecific but can be seen with cirrhosis. Electronically Signed   By: Elspeth Bathe M.D.   On: 05/18/2024 13:57   EKG: Not performed in emergency department  Assessment/Plan Principal Problem:   Acute cholecystitis Active Problems:   COPD (chronic obstructive pulmonary disease) (HCC)   Chronic diastolic CHF (congestive heart failure) (HCC)   Hypertension   Hypothyroidism   Severe aortic stenosis S/P TAVR (transcatheter aortic valve replacement)   Class 1 obesity   Cholecystitis, acute   Chronic respiratory failure with hypoxia (HCC)   Acute cholecystitis > Patient presented with 3 days of right upper  quadrant abdominal pain.  Ultrasound showing changes consistent with gallstones and acute cholecystitis. > Leukocytosis to 17.9.  Vital signs stable in the ED. > Received antibiotics, morphine , fluids in the ED.  General surgery consulted and are following. - Monitor on telemetry overnight - Appreciate general surgery recommendations and assistance - Continue with ciprofloxacin  and Flagyl  in the setting of concern allergy - Trend fever curve and WBC - Hold off on additional IV  fluids, received half a liter in the ED and has history of CHF - N.p.o. except sips with meds and ice chips - Pain medication as needed  Hypertension - Continue home Lasix , losartan , metoprolol   Hypothyroidism - Continue Synthroid   Severe aortic stenosis - Status post TAVR  Chronic diastolic CHF - Continue home Lasix , losartan , metoprolol   Obesity - Noted  OSA - BiPAP qhs  COPD Chronic respiratory with hypoxia - On chronic 2-3L at home. - Replace home Dulera  formulary Breo - As needed albuterol   DVT prophylaxis: SCDs for now Code Status:   DNR/Temporary intubation okay Family Communication:  Updated at bedside  Disposition Plan:   Patient is from:  Home  Anticipated DC to:  Home  Anticipated DC date:  2 to 4 days  Anticipated DC barriers: None  Consults called:  General Surgery Admission status:  Inpatient, telemetry  Severity of Illness: The appropriate patient status for this patient is INPATIENT. Inpatient status is judged to be reasonable and necessary in order to provide the required intensity of service to ensure the patient's safety. The patient's presenting symptoms, physical exam findings, and initial radiographic and laboratory data in the context of their chronic comorbidities is felt to place them at high risk for further clinical deterioration. Furthermore, it is not anticipated that the patient will be medically stable for discharge from the hospital within 2 midnights of admission.    * I certify that at the point of admission it is my clinical judgment that the patient will require inpatient hospital care spanning beyond 2 midnights from the point of admission due to high intensity of service, high risk for further deterioration and high frequency of surveillance required.DEWAINE Marsa KATHEE Seena MD [facility]  How to contact the [facility] Attending or Consulting provider 7A - 7P or covering provider during after hours 7P -7A, for this patient?   Check the care team in [facility] and look for a) attending/consulting [facility] provider listed and b) the [facility] team listed Log into [email or web address] and use [facility] universal password to access. If you do not have the password, please contact the hospital operator. Locate the [facility] provider you are looking for under [facility] and page to a number that you can be directly reached. If you still have difficulty reaching the provider, please page the [facility] (Director on Call) for the Hospitalists listed on amion for assistance.  05/18/2024, 3:34 PM

## 2024-05-18 NOTE — ED Triage Notes (Signed)
 Pt to ED from [facility] for RLQ pain x3 days. Denies fever, n/v. Wears 2 L [place] chronic.

## 2024-05-18 NOTE — Progress Notes (Signed)
 History of Present Illness:   Kenneth Larsen is a 78 y.o. male here for acute onset of right lower quadrant pain that began Tuesday night.  He was at rest when it started.  He states that it comes and goes, but is mostly constant.  He denies any urinary symptoms.  He denies any vomiting.  He had a normal bowel movement yesterday.  But was constipated for 2 days.  He denies any previous history of gallstones, or kidney stones.  He has not had any abdominal surgeries.  He does have his gallbladder and his appendix.  He had chicken noodle soup last night for dinner around 530 PM.   Past Medical History:   Past Medical History:  Diagnosis Date  . Hypertension   . Hypothyroid   . Melanoma (CMS/HHS-HCC)   . Obesity     Past Surgical History:   Past Surgical History:  Procedure Laterality Date  . melanoma     removal   . VASECTOMY      Allergies:   Allergies  Allergen Reactions  . Penicillin Hives    Current Medications:   Prior to Admission medications  Medication Sig Taking? Last Dose  cholecalciferol  (VITAMIN D3) 1,000 unit tablet Take 1,000 Units by mouth once daily. Yes Taking  cyanocobalamin  (VITAMIN B12) 1000 MCG tablet Take 1,000 mcg by mouth once daily. Yes Taking  FUROsemide  (LASIX ) 40 MG tablet Take 40 mg by mouth once daily Yes Taking  levothyroxine  (SYNTHROID , LEVOTHROID) 50 MCG tablet Take 50 mcg by mouth once daily. Take on an empty stomach with a glass of water  at least 30-60 minutes before breakfast. Yes Taking  losartan  (COZAAR ) 50 MG tablet Take 50 mg by mouth once daily Yes Taking  metoprolol  succinate (TOPROL -XL) 25 MG XL tablet Take 25 mg by mouth once daily Yes Taking  mometasone -formoterol  (DULERA ) 200-5 mcg/actuation inhaler Inhale 2 inhalations into the lungs 2 (two) times daily Yes Taking  spironolactone (ALDACTONE) 25 MG tablet Take 1 tablet (25 mg total) by mouth once daily for 7 days    TORsemide (DEMADEX) 20 MG tablet Take 1 tablet (20 mg total) by  mouth once daily for 7 days      Family History:   Family History  Problem Relation Name Age of Onset  . Throat cancer Father    . ALS Sister      Social History:   Social History   Socioeconomic History  . Marital status: Married  Tobacco Use  . Smoking status: Former  Advertising account planner  . Vaping status: Never Used  Substance and Sexual Activity  . Alcohol use: No    Alcohol/week: 0.0 standard drinks of alcohol  . Drug use: No  . Sexual activity: Defer   Social Drivers of Health   Food Insecurity: No Food Insecurity (05/06/2022)   Received from [facility]   Hunger Vital Sign   . Within the past 12 months, you worried that your food would run out before you got the money to buy more.: Never true   . Within the past 12 months, the food you bought just didn't last and you didn't have money to get more.: Never true  Transportation Needs: No Transportation Needs (05/06/2022)   Received from [facility] - Transportation   . Lack of Transportation (Medical): No   . Lack of Transportation (Non-Medical): No  Housing Stability: Unknown (09/23/2023)   Housing Stability Vital Sign   . Homeless in the Last Year: No  Review of Systems:   Review of Systems  Constitutional:  Negative for chills, fatigue, fever and unexpected weight change.  HENT:  Negative for ear pain, rhinorrhea and sore throat.   Eyes:  Negative for pain and visual disturbance.  Respiratory:  Negative for cough, chest tightness and shortness of breath.   Cardiovascular:  Negative for chest pain, palpitations and leg swelling.  Gastrointestinal:  Positive for abdominal pain. Negative for constipation, diarrhea, nausea and vomiting.  Genitourinary:  Negative for dysuria, frequency and hematuria.  Musculoskeletal:  Negative for arthralgias, joint swelling and myalgias.  Skin:  Negative for rash.  Neurological:  Negative for headaches.  All other systems reviewed and are negative.   Vitals:   Vitals:    05/18/24 0919  BP: 110/60  Pulse: 80  Temp: 36.7 C (98.1 F)  TempSrc: Oral  SpO2: 98%  Weight: 91.6 kg (202 lb)  Height: 185.4 cm (6' 0.99)     Body mass index is 26.66 kg/m.  Physical Exam:   Physical Exam Vitals and nursing note reviewed.  Constitutional:      Appearance: He is well-developed.  HENT:     Head: Normocephalic and atraumatic.     Right Ear: Hearing, tympanic membrane, ear canal and external ear normal.     Left Ear: Hearing, tympanic membrane, ear canal and external ear normal.     Nose: Nose normal.     Mouth/Throat:     Pharynx: Oropharynx is clear. Uvula midline.  Eyes:     General: Lids are normal. Vision grossly intact.     Conjunctiva/sclera: Conjunctivae normal.     Pupils: Pupils are equal, round, and reactive to light.  Cardiovascular:     Rate and Rhythm: Normal rate and regular rhythm.     Pulses: Normal pulses.     Heart sounds: Normal heart sounds. No murmur heard. Pulmonary:     Effort: Pulmonary effort is normal.     Breath sounds: Normal breath sounds. No wheezing.  Abdominal:     General: Bowel sounds are normal.     Palpations: Abdomen is soft.     Tenderness: There is abdominal tenderness in the right upper quadrant and right lower quadrant. There is guarding. There is no rebound. Positive signs include McBurney's sign.     Comments: Gastrointestinal system examined as above.  Musculoskeletal:        General: Normal range of motion.     Cervical back: Full passive range of motion without pain, normal range of motion and neck supple.  Skin:    General: Skin is warm and dry.  Neurological:     General: No focal deficit present.     Mental Status: He is alert and oriented to person, place, and time.  Psychiatric:        Attention and Perception: Attention and perception normal.        Mood and Affect: Mood and affect normal.        Speech: Speech normal.        Behavior: Behavior normal. Behavior is cooperative.         Thought Content: Thought content normal.     Assessment and Plan:  No results found for this visit on 05/18/24.  Diagnoses and all orders for this visit:  Abdominal pain, RLQ (right lower quadrant)    Patient Instructions  As we discussed, right lower quadrant pain is an acute abdomen, and highly suspicious for acute appendicitis.  Therefore, I am sending you to the  emergency department for further evaluation.  They will likely do imaging to further evaluate the cause of your pain.    Portions of this note were created using dictation software and may contain typographical errors.   Patient received an After Visit Summary

## 2024-05-18 NOTE — Consult Note (Signed)
 [facility] SURGICAL CONSULTATION NOTE (initial) - cpt: 99244   HISTORY OF PRESENT ILLNESS (HPI):  78 y.o. male presented to [facility] ED today for evaluation of abdominal pain. Patient reports reports the acute onset of right sided abdominal pain over the course of the last 3 days. He reports this resolved briefly yesterday but returned today and has been persistent. He denied any associated fever, chills, cough, CP, SOB, emesis, or bowel changes. No history of known gallbladder disease previous. No previous abdominal surgeries. 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 Work up in the ED revealed a leukocytosis to 17.9K, Hgb to 12.4, sCr - 1.18, bilirubin 2.2. He has RUQ US  concerning for cholecystitis.   Surgery is consulted by emergency medicine physician Dr. Lamar Price, MD in this context for evaluation and management of acute cholecystitis.  PAST MEDICAL HISTORY (PMH):  Past Medical History:  Diagnosis Date   Arthritis    CAD (coronary artery disease)    a. 09/2023 Cath: LM 15d, LAD 40p, LCX 67m, OM1 min irregs, RCA 40p, 30d.   Cancer (HCC)    2010-melanoma   Chronic diastolic (congestive) heart failure (HCC)    COPD (chronic obstructive pulmonary disease) (HCC)    Hypertension    Hyponatremia 05/06/2022   Hypothyroidism    Severe aortic stenosis    a. 10/2018 s/p TAVR w/ Edwards Sapien 3 29 mm AoV; b. 04/2022 Echo: EF 55-60%, no rwma, Gri DD, nl RV fxn, RVSP 28.73mmHg. No AS/AI.     PAST SURGICAL HISTORY (PSH):  Past Surgical History:  Procedure Laterality Date   CARDIAC CATHETERIZATION     RIGHT/LEFT HEART CATH AND CORONARY ANGIOGRAPHY N/A 10/13/2018   Procedure: RIGHT/LEFT HEART CATH AND CORONARY ANGIOGRAPHY;  Surgeon: Mady Bruckner, MD;  Location: [facility] INVASIVE CV LAB;  Service: Cardiovascular;  Laterality: N/A;   TEE WITHOUT CARDIOVERSION N/A 11/15/2018   Procedure: TRANSESOPHAGEAL ECHOCARDIOGRAM  (TEE);  Surgeon: Verlin Bruckner BIRCH, MD;  Location: [facility] INVASIVE CV LAB;  Service: Open Heart Surgery;  Laterality: N/A;   TRANSCATHETER AORTIC VALVE REPLACEMENT, TRANSFEMORAL  11/15/2018   TRANSCATHETER AORTIC VALVE REPLACEMENT, TRANSFEMORAL N/A 11/15/2018   Procedure: TRANSCATHETER AORTIC VALVE REPLACEMENT, TRANSFEMORAL;  Surgeon: Verlin Bruckner BIRCH, MD;  Location: [facility] INVASIVE CV LAB;  Service: Open Heart Surgery;  Laterality: N/A;   VASECTOMY       MEDICATIONS:  Prior to Admission medications   Medication Sig Start Date End Date Taking? Authorizing Provider  albuterol  (VENTOLIN  HFA) 108 (90 Base) MCG/ACT inhaler Inhale 2 puffs into the lungs every 6 (six) hours as needed for wheezing or shortness of breath. Patient not taking: Reported on 12/15/2023 05/12/22   Fausto Sor A, DO  atorvastatin  (LIPITOR) 40 MG tablet Take 1 tablet (40 mg total) by mouth daily. 12/21/23 03/20/24  Vivienne Bruckner Ingle, NP  Cholecalciferol  (VITAMIN D -1000 MAX ST) 25 MCG (1000 UT) tablet Take 1,000 Units by mouth daily.     [provider]  feeding supplement (ENSURE ENLIVE / ENSURE PLUS) LIQD Take 237 mLs by mouth 2 (two) times daily between meals. Patient not taking: Reported on 12/15/2023 05/12/22   Fausto Sor A, DO  furosemide  (LASIX ) 40 MG tablet Take 1 tablet (40 mg total) by mouth daily. May take an extra 1 tablet ( 40 MG ) daily as needed for leg swelling. 12/15/23 12/14/24  Vivienne Bruckner Ingle, NP  guaiFENesin  (MUCINEX ) 600 MG 12 hr tablet Take 1 tablet (  600 mg total) by mouth 2 (two) times daily as needed. Patient not taking: Reported on 12/15/2023 05/12/22   Fausto Sor A, DO  levothyroxine  (SYNTHROID , LEVOTHROID) 50 MCG tablet Take 50 mcg by mouth daily before breakfast.     [provider]  losartan  (COZAAR ) 50 MG tablet Take 1 tablet (50 mg total) by mouth daily. 06/15/23   Gollan, Timothy J, MD  metoprolol  succinate (TOPROL -XL) 25 MG 24 hr tablet Take 1 tablet  (25 mg total) by mouth daily. 06/15/23   Gollan, Timothy J, MD  mometasone -formoterol  (DULERA ) 200-5 MCG/ACT AERO Inhale 2 puffs into the lungs 2 (two) times daily. 11/29/23   Gollan, Timothy J, MD  mometasone -formoterol  (DULERA ) 200-5 MCG/ACT AERO Inhale 2 puffs into the lungs 2 (two) times daily. Patient not taking: Reported on 12/15/2023 11/29/23   Gollan, Timothy J, MD  tiotropium (SPIRIVA  HANDIHALER) 18 MCG inhalation capsule Place 1 capsule (18 mcg total) into inhaler and inhale daily. Patient not taking: Reported on 12/15/2023 05/12/22 12/15/23  Fausto Sor A, DO  vitamin B-12 (CYANOCOBALAMIN ) 1000 MCG tablet Take 1,000 mcg by mouth daily.    [provider]     ALLERGIES:  Allergies  Allergen Reactions   Penicillins Hives    Did it involve swelling of the face/tongue/throat, SOB, or low BP? No Did it involve sudden or severe rash/hives, skin peeling, or any reaction on the inside of your mouth or nose? No Did you need to seek medical attention at a hospital or doctor's office? No When did it last happen?      childhood allergy If all above answers are NO, may proceed with cephalosporin use.      SOCIAL HISTORY:  Social History   Socioeconomic History   Marital status: Married    Spouse name: Not on file   Number of children: 3   Years of education: Not on file   Highest education level: Not on file  Occupational History   Occupation: Retired-Textiles  Tobacco Use   Smoking status: Former    Current packs/day: 1.00    Average packs/day: 1 pack/day for 25.0 years (25.0 ttl pk-yrs)    Types: Cigarettes   Smokeless tobacco: Never  Vaping Use   Vaping status: Never Used  Substance and Sexual Activity   Alcohol use: Never   Drug use: Never   Sexual activity: Not on file  Other Topics Concern   Not on file  Social History Narrative   Not on file   Social Drivers of Health   Financial Resource Strain: Not on file  Food Insecurity: No Food Insecurity  (05/06/2022)   Hunger Vital Sign    Worried About Running Out of Food in the Last Year: Never true    Ran Out of Food in the Last Year: Never true  Transportation Needs: No Transportation Needs (05/06/2022)   PRAPARE - Administrator, Civil Service (Medical): No    Lack of Transportation (Non-Medical): No  Physical Activity: Not on file  Stress: Not on file  Social Connections: Not on file  Intimate Partner Violence: Not At Risk (05/06/2022)   Humiliation, Afraid, Rape, and Kick questionnaire    Fear of Current or Ex-Partner: No    Emotionally Abused: No    Physically Abused: No    Sexually Abused: No     FAMILY HISTORY:  Family History  Problem Relation Age of Onset   Cancer Father       REVIEW OF SYSTEMS:  Review of  Systems  Constitutional:  Negative for chills and fever.  Respiratory:  Negative for cough and shortness of breath.   Cardiovascular:  Negative for chest pain and palpitations.  Gastrointestinal:  Positive for abdominal pain. Negative for constipation, diarrhea, nausea and vomiting.  Genitourinary:  Negative for dysuria and urgency.  All other systems reviewed and are negative.   VITAL SIGNS:  Temp:  [98.1 F (36.7 C)-98.4 F (36.9 C)] 98.1 F (36.7 C) (09/25 1419) Pulse Rate:  [92-94] 94 (09/25 1419) Resp:  [18-20] 18 (09/25 1419) BP: (122)/(57-62) 122/62 (09/25 1419) SpO2:  [99 %] 99 % (09/25 1419) Weight:  [91.6 kg] 91.6 kg (09/25 1008)     Height: 6' 1 (185.4 cm) Weight: 91.6 kg BMI (Calculated): 26.66   INTAKE/OUTPUT:  No intake/output data recorded.  PHYSICAL EXAM:  Physical Exam Vitals and nursing note reviewed. Exam conducted with a chaperone present.  Constitutional:      General: He is not in acute distress.    Appearance: He is well-developed. He is obese. He is not ill-appearing.     Comments: Resting in bed; NAD. Wife at bedside  HENT:     Head: Normocephalic and atraumatic.  Eyes:     General: No scleral icterus.     Extraocular Movements: Extraocular movements intact.  Cardiovascular:     Rate and Rhythm: Normal rate.  Pulmonary:     Effort: Pulmonary effort is normal. No respiratory distress.  Abdominal:     General: There is no distension.     Palpations: Abdomen is soft.     Tenderness: There is abdominal tenderness in the right upper quadrant. There is no guarding or rebound. Positive signs include Murphy's sign.     Comments: Abdomen is soft, he is tender in his RUQ with positive Murphy's Sign, non-distended, no rebound/guarding.   Genitourinary:    Comments: Deferred Skin:    General: Skin is warm and dry.     Coloration: Skin is not jaundiced.  Neurological:     General: No focal deficit present.     Mental Status: He is alert and oriented to person, place, and time.  Psychiatric:        Mood and Affect: Mood normal.        Behavior: Behavior normal.      Labs:     Latest Ref Rng & Units 05/18/2024   10:10 AM 05/10/2022    4:34 AM 05/06/2022    8:59 PM  CBC  WBC 4.0 - 10.5 K/uL 17.9  7.9  6.6   Hemoglobin 13.0 - 17.0 g/dL 87.5  86.0  85.6   Hematocrit 39.0 - 52.0 % 38.9  45.6  44.7   Platelets 150 - 400 K/uL 247  285  236       Latest Ref Rng & Units 05/18/2024   10:10 AM 05/12/2022    6:30 AM 05/11/2022    6:55 AM  CMP  Glucose 70 - 99 mg/dL 861  883  883   BUN 8 - 23 mg/dL 25  30  40   Creatinine 0.61 - 1.24 mg/dL 8.81  9.13  9.05   Sodium 135 - 145 mmol/L 133  136  136   Potassium 3.5 - 5.1 mmol/L 4.0  3.9  4.3   Chloride 98 - 111 mmol/L 92  85  87   CO2 22 - 32 mmol/L 29  42  42   Calcium  8.9 - 10.3 mg/dL 8.4  8.6  8.7   Total Protein  6.5 - 8.1 g/dL 6.2     Total Bilirubin 0.0 - 1.2 mg/dL 2.2     Alkaline Phos 38 - 126 U/L 44     AST 15 - 41 U/L 20     ALT 0 - 44 U/L 9        Imaging studies:   RUQ US  (05/18/2024) personally reviewed with cholelithiasis and changes concerning for cholecystitis, and radiologist report reviewed below:  IMPRESSION: 1.  Cholelithiasis with gallbladder wall thickening, pericholecystic fluid and positive sonographic Murphy sign compatible with acute cholecystitis. 2. Diffusely echogenic and heterogeneous liver with lobulated contours. This is nonspecific but can be seen with cirrhosis.   Assessment/Plan: (ICD-10's: K81.0) 78 y.o. male with acute cholecystitis, complicated by pertinent comorbidities including COPD on baseline O2, CHF, CAD, history of TAVR.   - I would recommend medicine admission given his comorbid conditions and plan for conservative management  - I had a long discussion with the patient and his wife regarding his presentation and work up. Clinical picture is consistent with cholecystitis. However, I do not think he is a very optimal surgical candidate given his cardiac history and need for home oxygen. I do think we can try to manage his cholecystitis with antibiotics alone first and reassess for improvement. If he fails to improve tomorrow (09/26), we can discuss with IR regarding percutaneous cholecystostomy tube placement. He, and his wife, understand this will need to be in place for 6-8 weeks.   - I would recommend IV Abx (Cipro /Flagyl )  - We will trend his bilirubin; morning CMP - changes noted may be sequela of liver disease noted on US . If this is significantly higher tomorrow, we can consider MRCP - NPO; okay sips with medications; ice chips   - Monitor leukocytosis; morning CBC  - Monitor abdominal examination; on-going bowel function   All of the above findings and recommendations were discussed with the patient and his wife, and all of their questions were answered to their expressed satisfaction.  Thank you for the opportunity to participate in this patient's care.   Face-to-face time spent with the patient and care providers was 45 minutes, with more than 50% of the time spent counseling, educating, and coordinating care of the patient.     -- Arthea Platt, PA-C [facility] 05/18/2024, 3:02 PM M-F: 7am - 4pm

## 2024-05-18 NOTE — ED Provider Notes (Signed)
 [facility] Provider Note    Event Date/Time   First MD Initiated Contact with Patient 05/18/24 1155     (approximate)   History   Abdominal Pain   HPI  Kenneth Larsen is a 78 y.o. male with a history of an aortic valve replacement who presents with complaints of right upper quadrant abdominal pain.  Patient reports that started 3 days ago.  No history of abdominal surgeries.     Physical Exam   Triage Vital Signs: ED Triage Vitals  Encounter Vitals Group     BP 05/18/24 1007 (!) 122/57     Girls Systolic BP Percentile --      Girls Diastolic BP Percentile --      Boys Systolic BP Percentile --      Boys Diastolic BP Percentile --      Pulse Rate 05/18/24 1007 92     Resp 05/18/24 1007 20     Temp 05/18/24 1007 98.4 F (36.9 C)     Temp src --      SpO2 05/18/24 1007 99 %     Weight 05/18/24 1008 91.6 kg (202 lb)     Height 05/18/24 1008 1.854 m (6' 1)     Head Circumference --      Peak Flow --      Pain Score 05/18/24 1007 6     Pain Loc --      Pain Education --      Exclude from Growth Chart --     Most recent vital signs: Vitals:   05/18/24 1007 05/18/24 1419  BP: (!) 122/57 122/62  Pulse: 92 94  Resp: 20 18  Temp: 98.4 F (36.9 C) 98.1 F (36.7 C)  SpO2: 99% 99%     General: Awake, no distress.  CV:  Good peripheral perfusion.  Resp:  Normal effort.  Abd:  No distention.  Tenderness in the right upper quadrant, no tenderness in the left lower quadrant right lower quadrant Other:     ED Results / Procedures / Treatments   Labs (all labs ordered are listed, but only abnormal results are displayed) Labs Reviewed  COMPREHENSIVE METABOLIC PANEL WITH GFR - Abnormal; Notable for the following components:      Result Value   Sodium 133 (*)    Chloride 92 (*)    Glucose, Bld 138 (*)    BUN 25 (*)    Calcium  8.4 (*)    Total Protein 6.2 (*)    Albumin 3.3 (*)    Total Bilirubin 2.2 (*)    All other components  within normal limits  CBC - Abnormal; Notable for the following components:   WBC 17.9 (*)    RBC 4.14 (*)    Hemoglobin 12.4 (*)    HCT 38.9 (*)    All other components within normal limits  URINALYSIS, ROUTINE W REFLEX MICROSCOPIC - Abnormal; Notable for the following components:   Color, Urine AMBER (*)    APPearance CLEAR (*)    Protein, ur 30 (*)    Bacteria, UA RARE (*)    All other components within normal limits  LIPASE, BLOOD     EKG  ED ECG REPORT I, Lamar Price, the attending physician, personally viewed and interpreted this ECG.  Date: 05/18/2024  Rhythm: normal sinus rhythm QRS Axis: normal Intervals: normal ST/T Wave abnormalities: normal Narrative Interpretation: no evidence of acute ischemia    RADIOLOGY Ultrasound consistent with acute cholecystitis  PROCEDURES:  Critical Care performed:   Procedures   MEDICATIONS ORDERED IN ED: Medications  ciprofloxacin  (CIPRO ) IVPB 400 mg (400 mg Intravenous New Bag/Given 05/18/24 1511)  metroNIDAZOLE  (FLAGYL ) IVPB 500 mg (500 mg Intravenous New Bag/Given 05/18/24 1515)  atorvastatin  (LIPITOR) tablet 40 mg (has no administration in time range)  furosemide  (LASIX ) tablet 40 mg (has no administration in time range)  losartan  (COZAAR ) tablet 50 mg (has no administration in time range)  metoprolol  succinate (TOPROL -XL) 24 hr tablet 25 mg (has no administration in time range)  levothyroxine  (SYNTHROID ) tablet 50 mcg (has no administration in time range)  fluticasone  furoate-vilanterol (BREO ELLIPTA ) 200-25 MCG/ACT 1 puff (has no administration in time range)  sodium chloride  flush (NS) 0.9 % injection 3 mL (3 mLs Intravenous Given 05/18/24 1546)  acetaminophen  (TYLENOL ) tablet 650 mg (has no administration in time range)    Or  acetaminophen  (TYLENOL ) suppository 650 mg (has no administration in time range)  HYDROcodone -acetaminophen  (NORCO/VICODIN) 5-325 MG per tablet 1 tablet (has no administration in time  range)  HYDROmorphone  (DILAUDID ) injection 0.5 mg (has no administration in time range)  polyethylene glycol (MIRALAX  / GLYCOLAX ) packet 17 g (has no administration in time range)  metroNIDAZOLE  (FLAGYL ) IVPB 500 mg (has no administration in time range)  ciprofloxacin  (CIPRO ) IVPB 400 mg (has no administration in time range)  morphine  (PF) 4 MG/ML injection 4 mg (4 mg Intravenous Given 05/18/24 1216)  ondansetron  (ZOFRAN ) injection 4 mg (4 mg Intravenous Given 05/18/24 1216)  sodium chloride  0.9 % bolus 500 mL (0 mLs Intravenous Stopped 05/18/24 1334)     IMPRESSION / MDM / ASSESSMENT AND PLAN / ED COURSE  I reviewed the triage vital signs and the nursing notes. Patient's presentation is most consistent with acute presentation with potential threat to life or bodily function.  Patient presents with right sided abdominal pain, he has significant tenderness in the right upper quadrant on exam, suspicious for cholecystitis, cholelithiasis, lab work reviewed and demonstrates elevated white blood cell count LFTs overall reassuring.  Will treat with IV morphine , IV Zofran  obtain ultrasound and reevaluate  Ultrasound is consistent with acute cholecystitis, have consulted the surgery team  Surgery recommends IV antibiotics, admission to medicine given multiple comorbidities, drain tomorrow if no improvement.  They recommend Cipro  and Flagyl   I have consulted the hospitalist team, they will admit the patient      FINAL CLINICAL IMPRESSION(S) / ED DIAGNOSES   Final diagnoses:  Acute cholecystitis     Rx / DC Orders   ED Discharge Orders     None        Note:  This document was prepared using Dragon voice recognition software and may include unintentional dictation errors.   Arlander Charleston, MD 05/18/24 [phone]

## 2024-05-19 ENCOUNTER — Inpatient Hospital Stay: Admitting: Radiology

## 2024-05-19 ENCOUNTER — Inpatient Hospital Stay

## 2024-05-19 DIAGNOSIS — K810 Acute cholecystitis: Secondary | ICD-10-CM | POA: Diagnosis not present

## 2024-05-19 HISTORY — PX: IR PERC CHOLECYSTOSTOMY: IMG2326

## 2024-05-19 LAB — COMPREHENSIVE METABOLIC PANEL WITH GFR
ALT: 13 U/L (ref 0–44)
AST: 16 U/L (ref 15–41)
Albumin: 2.9 g/dL — ABNORMAL LOW (ref 3.5–5.0)
Alkaline Phosphatase: 38 U/L (ref 38–126)
Anion gap: 10 (ref 5–15)
BUN: 25 mg/dL — ABNORMAL HIGH (ref 8–23)
CO2: 27 mmol/L (ref 22–32)
Calcium: 8.1 mg/dL — ABNORMAL LOW (ref 8.9–10.3)
Chloride: 97 mmol/L — ABNORMAL LOW (ref 98–111)
Creatinine, Ser: 1.06 mg/dL (ref 0.61–1.24)
GFR, Estimated: >60 mL/min (ref >60)
Glucose, Bld: 90 mg/dL (ref 70–99)
Potassium: 4 mmol/L (ref 3.5–5.1)
Sodium: 134 mmol/L — ABNORMAL LOW (ref 135–145)
Total Bilirubin: 2.3 mg/dL — ABNORMAL HIGH (ref 0.0–1.2)
Total Protein: 5.9 g/dL — ABNORMAL LOW (ref 6.5–8.1)

## 2024-05-19 LAB — CBC
HCT: 36 % — ABNORMAL LOW (ref 39.0–52.0)
Hemoglobin: 11.8 g/dL — ABNORMAL LOW (ref 13.0–17.0)
MCH: 30.2 pg (ref 26.0–34.0)
MCHC: 32.8 g/dL (ref 30.0–36.0)
MCV: 92.1 fL (ref 80.0–100.0)
Platelets: 193 K/uL (ref 150–400)
RBC: 3.91 MIL/uL — ABNORMAL LOW (ref 4.22–5.81)
RDW: 13.4 % (ref 11.5–15.5)
WBC: 19.3 K/uL — ABNORMAL HIGH (ref 4.0–10.5)
nRBC: 0 % (ref 0.0–0.2)

## 2024-05-19 LAB — PROTIME-INR
INR: 1.4 — ABNORMAL HIGH (ref 0.8–1.2)
Prothrombin Time: 17.5 s — ABNORMAL HIGH (ref 11.4–15.2)

## 2024-05-19 MED ORDER — MIDAZOLAM HCL 2 MG/2ML IJ SOLN
INTRAMUSCULAR | Status: AC
  Filled 2024-05-19: qty 2

## 2024-05-19 MED ORDER — MIDAZOLAM HCL 5 MG/5ML IJ SOLN
INTRAMUSCULAR | Status: AC | PRN
  Administered 2024-05-19: 1 mg via INTRAVENOUS

## 2024-05-19 MED ORDER — LACTATED RINGERS IV SOLN: INTRAVENOUS | Status: AC

## 2024-05-19 MED ORDER — LIDOCAINE-EPINEPHRINE 1 %-1:100000 IJ SOLN
INTRAMUSCULAR | Status: AC
  Filled 2024-05-19: qty 1

## 2024-05-19 MED ORDER — IOHEXOL 300 MG/ML  SOLN
100.0000 mL | Freq: Once | INTRAMUSCULAR | Status: AC | PRN
  Administered 2024-05-19: 100 mL via INTRAVENOUS

## 2024-05-19 MED ORDER — FENTANYL CITRATE (PF) 100 MCG/2ML IJ SOLN
INTRAMUSCULAR | Status: AC | PRN
  Administered 2024-05-19: 50 ug via INTRAVENOUS

## 2024-05-19 MED ORDER — LIDOCAINE-EPINEPHRINE 1 %-1:100000 IJ SOLN
20.0000 mL | Freq: Once | INTRAMUSCULAR | Status: AC
  Administered 2024-05-19: 10 mL via INTRADERMAL

## 2024-05-19 MED ORDER — SODIUM CHLORIDE 0.9% FLUSH
5.0000 mL | Freq: Three times a day (TID) | INTRAVENOUS | Status: DC
  Administered 2024-05-19 – 2024-05-23 (×13): 5 mL

## 2024-05-19 MED ORDER — IOHEXOL 300 MG/ML  SOLN
50.0000 mL | Freq: Once | INTRAMUSCULAR | Status: AC | PRN
  Administered 2024-05-19: 5 mL

## 2024-05-19 MED ORDER — GADOBUTROL 1 MMOL/ML IV SOLN
10.0000 mL | Freq: Once | INTRAVENOUS | Status: AC | PRN
  Administered 2024-05-19: 10 mL via INTRAVENOUS

## 2024-05-19 MED ORDER — FENTANYL CITRATE (PF) 100 MCG/2ML IJ SOLN
INTRAMUSCULAR | Status: AC
  Filled 2024-05-19: qty 2

## 2024-05-19 NOTE — Care Management Important Message (Signed)
 Important Message  Patient Details  Name: EL PILE MRN: [id number] Date of Birth: 15-Apr-1946   Important Message Given:  Yes - Medicare IM     Rojelio SHAUNNA Rattler 05/19/2024, 3:39 PM

## 2024-05-19 NOTE — TOC CM/SW Note (Signed)
 Transition of Care [facility]) - Inpatient Brief Assessment   Patient Details  Name: Kenneth Larsen MRN: [id number] Date of Birth: 1946-08-21  Transition of Care [facility]) CM/SW Contact:    [name], LCSW Phone Number: 05/19/2024, 10:46 AM   Clinical Narrative:  Kenneth Larsen Rummer completed TOC chart review no TOC needs identified please contact should need arise   Transition of Care Asessment:

## 2024-05-19 NOTE — Progress Notes (Signed)
 Patient clinically stable post IR 10 FR Cholecystostomy tube placement per DR McCUllough, tolerated well. Vitals stable post procedure. Awakens readily to spoken voice, otherwise dozing at intervals. Received Versed  1 mg along with Fentanyl  50 mcg IV for procedure. Report given to Chrissy RN post procedure/specials/2C 225 post recovery. Denies complaints post procedure.

## 2024-05-19 NOTE — Progress Notes (Addendum)
 Progress Note   Patient: Kenneth Larsen FMW:[id number] DOB: 1946/02/28 DOA: 05/18/2024     1 DOS: the patient was seen and examined on 05/19/2024   Brief hospital course:  Kenneth Larsen is a 78 y.o. male with medical history significant of hypertension, hypothyroidism, severe aortic stenosis status post TAVR, chronic diastolic CHF, obesity, COPD, chronic respiratory failure with hypoxia respiratory failure on 2-3L presenting with abdominal pain.   Patient reports 3 days of right upper quadrant pain.   Patient denies fevers, chills, chest pain, shortness of breath, constipation, diarrhea, nausea, vomiting.   ED Course: Vital signs in ED stable.   Lab workup notable for CMP with sodium 133, chloride 92, BUN 25, glucose 138, calcium  8.4, protein 6.2, albumin 3.3, T. bili 2.2.  CBC with leukocytosis to 17.9, hemoglobin stable at 12.4.  Lipase normal.  Urinalysis with protein and rare bacteria only.   Right upper quadrant ultrasound showed gallstones and changes consistent with acute cholecystitis.  Nonspecific liver changes also noted that could be consistent with cirrhosis.   Patient sees ciprofloxacin , Flagyl  in the ED.  Also received morphine , 500 cc IV fluid, Zofran .   General surgery consulted and are following.    Assessment and Plan:  Principal Problem:   Acute cholecystitis Active Problems:   COPD (chronic obstructive pulmonary disease) (HCC)   Chronic diastolic CHF (congestive heart failure) (HCC)   Hypertension   Hypothyroidism   Severe aortic stenosis S/P TAVR (transcatheter aortic valve replacement)   Class 1 obesity   Cholecystitis, acute   Chronic respiratory failure with hypoxia (HCC)   Acute cholecystitis Patient presented with 3 days of right upper quadrant abdominal pain.  Ultrasound showing changes consistent with gallstones and acute cholecystitis. Leukocytosis to 17.9 >> 19.3.  Tmax 100.9 Seen in consultation by surgery and MRCP is planned as well as an IR  consult for percutaneous cholecystostomy tube placement Continue empiric antibiotic therapy with ciprofloxacin  and Flagyl     Hypertension - Continue  losartan  and metoprolol    Hypothyroidism - Continue Synthroid    Severe aortic stenosis - Status post TAVR   Chronic diastolic CHF - Not acutely exacerbated - Continue home Lasix , losartan , metoprolol    Obesity - Complicates overall prognosis and care -Lifestyle modification and exercise has been discussed with patient   OSA - BiPAP qhs   COPD Chronic respiratory with hypoxia - On chronic 2-3L at home. - Continue as needed bronchodilator therapy as well as inhaled steroids  09/26 MRCP plan for today.  IR consult for cholecystostomy tube placement      Subjective: Continues to have right upper quadrant pain but improved from admission  Physical Exam: Vitals:   05/19/24 0306 05/19/24 0500 05/19/24 0917 05/19/24 1000  BP: (!) 133/53  (!) 142/74   Pulse: 93  (!) 114 (!) 105  Resp: 20  18   Temp: 99.5 F (37.5 C)  99.2 F (37.3 C)   TempSrc: Oral     SpO2: 100%  100%   Weight:  91 kg    Height:       Constitutional:      General: He is not in acute distress.    Appearance: Normal appearance.  HENT:     Head: Normocephalic and atraumatic.     Mouth/Throat:     Mouth: Mucous membranes are moist.     Pharynx: Oropharynx is clear.  Eyes:     Extraocular Movements: Extraocular movements intact.     Pupils: Pupils are equal, round, and reactive to  light.  Cardiovascular:     Rate and Rhythm: Normal rate and regular rhythm.     Pulses: Normal pulses.     Heart sounds: Normal heart sounds.  Pulmonary:     Effort: Pulmonary effort is normal. No respiratory distress.     Breath sounds: Normal breath sounds.  Abdominal:     General: Bowel sounds are normal. There is no distension.     Palpations: Abdomen is soft.     Tenderness: There is abdominal tenderness.  Musculoskeletal:        General: No swelling or  deformity.  Skin:    General: Skin is warm and dry.  Neurological:     General: No focal deficit present.     Mental Status: Mental status is at baseline.      Data Reviewed: Sodium 134, potassium 4.0, chloride 97, bicarb 27, glucose 90, BUN 25, creatinine 1.06, calcium  8.1, total protein 5.9, albumin 2.9, AST 16, ALT 13, alk phos 38, total bilirubin 2.3, white count 18.3, hemoglobin 11.8, hematocrit 36, platelet count 193 Labs reviewed  Family Communication: Plan of care discussed with patient and his wife at the bedside.  They verbalized understanding and agreed with the plan  Disposition: Status is: Inpatient Remains inpatient appropriate because: On IV antibiotics.   Planned Discharge Destination: TBD    Time spent: 50 minutes  Author: Aimee Somerset, MD 05/19/2024 11:09 AM  For on call review [email or web address].

## 2024-05-19 NOTE — Progress Notes (Signed)
 [facility] SURGICAL PROGRESS NOTE (cpt [id number])  Hospital Day(s): 1.   Interval History: Patient seen and examined, no acute events or new complaints overnight. Patient reports his pain may be slightly improved but certainly is still present. No fever, chills, nausea, emesis. His leukocytosis is worsening this AM; 19.3K this AM. Hgb to 11.8. Renal function baseline; sCr - 1.06. Bilirubin still elevated to 2.3. He is NPO. He continues on Cipro /Flagyl .   Review of Systems:  Constitutional: denies fever, chills  HEENT: denies cough or congestion  Respiratory: denies any shortness of breath  Cardiovascular: denies chest pain or palpitations  Gastrointestinal: + abdominal pain, denies N/V Genitourinary: denies burning with urination or urinary frequency Musculoskeletal: denies pain, decreased motor or sensation  Vital signs in last 24 hours: [min-max] current  Temp:  [98.1 F (36.7 C)-100.9 F (38.3 C)] 99.5 F (37.5 C) (09/26 0306) Pulse Rate:  [92-106] 93 (09/26 0306) Resp:  [18-20] 20 (09/26 0306) BP: (122-143)/(53-62) 133/53 (09/26 0306) SpO2:  [99 %-100 %] 100 % (09/26 0306) Weight:  [91 kg-91.6 kg] 91 kg (09/26 0500)     Height: 6' 1 (185.4 cm) Weight: 91 kg BMI (Calculated): 26.47   Intake/Output last 2 shifts:  09/25 0701 - 09/26 0700 In: 333.3 [I.V.:16.6; IV Piggyback:316.8] Out: -    Physical Exam:  Constitutional: alert, cooperative and no distress  HENT: normocephalic without obvious abnormality  Eyes: PERRL, EOM's grossly intact and symmetric  Respiratory: breathing non-labored at rest, on [place] Cardiovascular: regular rate and sinus rhythm  Gastrointestinal: soft, he remains noticeably tender in RUQ, and non-distended   Labs:     Latest Ref Rng & Units 05/19/2024    6:45 AM 05/18/2024   10:10 AM 05/10/2022    4:34 AM  CBC  WBC 4.0 - 10.5 K/uL 19.3  17.9  7.9   Hemoglobin 13.0 - 17.0 g/dL 88.1  87.5  86.0   Hematocrit 39.0 - 52.0 % 36.0  38.9  45.6    Platelets 150 - 400 K/uL 193  247  285       Latest Ref Rng & Units 05/19/2024    6:45 AM 05/18/2024   10:10 AM 05/12/2022    6:30 AM  CMP  Glucose 70 - 99 mg/dL 90  861  883   BUN 8 - 23 mg/dL 25  25  30    Creatinine 0.61 - 1.24 mg/dL 8.93  8.81  9.13   Sodium 135 - 145 mmol/L 134  133  136   Potassium 3.5 - 5.1 mmol/L 4.0  4.0  3.9   Chloride 98 - 111 mmol/L 97  92  85   CO2 22 - 32 mmol/L 27  29  42   Calcium  8.9 - 10.3 mg/dL 8.1  8.4  8.6   Total Protein 6.5 - 8.1 g/dL 5.9  6.2    Total Bilirubin 0.0 - 1.2 mg/dL 2.3  2.2    Alkaline Phos 38 - 126 U/L 38  44    AST 15 - 41 U/L 16  20    ALT 0 - 44 U/L 13  9       Imaging studies: No new pertinent imaging studies   Assessment/Plan: 78 y.o. male with acute cholecystitis, complicated by pertinent comorbidities including COPD on baseline O2, CHF, CAD, history of TAVR.    - Given his worsening leukocytosis and persistent pain, I did reach out to IR regarding percutaneous cholecystotomy tube placement. They are requesting CT Abdomen/Pelvis, which I have  ordered. Appreciate their assistance. Again, he is a sub-optimal surgical candidate given his cardiac history and need for home oxygen    - Will also get MRCP to ensure no missed choledocholithiasis in this setting as well. He did have question of cirrhosis on US  which certainly could explain hyperbilirubinemia; however, given his pain, WBC, and comorbidities I think it is prudent to rule out choledocholithiasis   - NPO for planned work up and possible procedures - Continue IV Abx (Cipro /Flagyl )             - Monitor leukocytosis; worsening  - Monitor hyperbilirubinemia              - Monitor abdominal examination; on-going bowel function   - Further management per primary service; we will follow   All of the above findings and recommendations were discussed with the patient, patient's family (wife at bedside), and the medical team, and all of patient's and family's questions were  answered to his/her/their expressed satisfaction.  -- Arthea Platt, PA-C [facility] 05/19/2024, 7:43 AM M-F: 7am - 4pm

## 2024-05-19 NOTE — Plan of Care (Signed)

## 2024-05-19 NOTE — Consult Note (Signed)
 Chief Complaint:  Acute Cholecystitis  Procedure: Percutaneous Cholecystostomy Placement  Referring Provider(s): Arthea Platt, PA-C  Supervising Physician: Karalee Beat  Patient Status: [facility] - In-pt  History of Present Illness: Kenneth Larsen is a 78 y.o. male with a history of COPD on 3L [place] at home, CAD, CHF, aortic stenosis s/p TAVR in 2020 who presented to the ED on 9/25 with complaints of RUQ for the past 3 days. ED workup revealed leukocytosis to 17.9 and imaging concerning for acute cholecystitis. Given his cardiac history and respiratory status, patient was deemed a poor surgical candidate and IR consulted for possible percutaneous cholecystostomy tube placement. MRCP order to rule out cholangitis. Case and imaging reviewed and approved by Dr. VEAR Karalee.   Patient resting in bed with his wife at the bedside. States that his RUQ pain has somewhat improved over the past 24hrs, but he continues to have pain with movement or when pressed on. Admits to feeling somewhat feverish, but denies any nausea/vomiting, diarrhea, worsening shortness of breath, or chest pain. NPO since midnight. All questions and concerns answered at the bedside.   Per patient he is to remain DNR-Limited for the procedure.  Past Medical History:  Diagnosis Date   Acute respiratory failure with hypoxia (HCC) 05/07/2022   Arthritis    CAD (coronary artery disease)    a. 09/2023 Cath: LM 15d, LAD 40p, LCX 86m, OM1 min irregs, RCA 40p, 30d.   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 04/2022 Echo: EF 55-60%, no rwma, Gri DD, nl RV fxn, RVSP 28.17mmHg. No AS/AI.    Past Surgical  History:  Procedure Laterality Date   CARDIAC CATHETERIZATION     RIGHT/LEFT HEART CATH AND CORONARY ANGIOGRAPHY N/A 10/13/2018   Procedure: RIGHT/LEFT HEART CATH AND CORONARY ANGIOGRAPHY;  Surgeon: Mady Bruckner, MD;  Location: [facility] INVASIVE CV LAB;  Service: Cardiovascular;  Laterality: N/A;   TEE WITHOUT CARDIOVERSION N/A 11/15/2018   Procedure: TRANSESOPHAGEAL ECHOCARDIOGRAM (TEE);  Surgeon: Verlin Bruckner BIRCH, MD;  Location: [facility] INVASIVE CV LAB;  Service: Open Heart Surgery;  Laterality: N/A;   TRANSCATHETER AORTIC VALVE REPLACEMENT, TRANSFEMORAL  11/15/2018   TRANSCATHETER AORTIC VALVE REPLACEMENT, TRANSFEMORAL N/A 11/15/2018   Procedure: TRANSCATHETER AORTIC VALVE REPLACEMENT, TRANSFEMORAL;  Surgeon: Verlin Bruckner BIRCH, MD;  Location: [facility] INVASIVE CV LAB;  Service: Open Heart Surgery;  Laterality: N/A;   VASECTOMY      Allergies: Penicillins  Medications: Prior to Admission medications   Medication Sig Start Date End Date Taking? Authorizing Provider  Cholecalciferol  (VITAMIN D -1000 MAX ST) 25 MCG (1000 UT) tablet Take 1,000 Units by mouth daily.    Yes [provider]  furosemide  (LASIX ) 40 MG tablet Take 1 tablet (40 mg total) by mouth daily. May take an extra 1 tablet ( 40 MG ) daily as needed for leg swelling. 12/15/23 12/14/24 Yes Vivienne Bruckner Ingle, NP  levothyroxine  (SYNTHROID , LEVOTHROID) 50 MCG tablet Take 50 mcg by mouth daily before breakfast.    Yes [provider]  losartan  (COZAAR ) 50 MG tablet Take 1 tablet (50 mg total) by mouth daily. 06/15/23  Yes Gollan, Timothy J, MD  metoprolol  succinate (TOPROL -XL) 25 MG 24 hr tablet Take 1 tablet (25 mg total) by mouth daily. 06/15/23  Yes Gollan, Timothy J, MD  mometasone -formoterol  (DULERA ) 200-5 MCG/ACT AERO Inhale 2 puffs into the lungs 2 (two) times daily. 11/29/23  Yes Gollan, Timothy J, MD  vitamin B-12 (CYANOCOBALAMIN ) 1000 MCG tablet Take 1,000 mcg by mouth daily.   Yes [provider]     Family History  Problem Relation Age of Onset   Cancer Father     Social History   Socioeconomic History   Marital status: Married    Spouse name: Not on file   Number of children: 3   Years of education: Not on file   Highest education level: Not on file  Occupational History   Occupation: Retired-Textiles  Tobacco Use   Smoking status: Former    Current packs/day: 1.00    Average packs/day: 1 pack/day for 25.0 years (25.0 ttl pk-yrs)    Types: Cigarettes   Smokeless tobacco: Never  Vaping Use   Vaping status: Never Used  Substance and Sexual Activity   Alcohol use: Never   Drug use: Never   Sexual activity: Not on file  Other Topics Concern   Not on file  Social History Narrative   Not on file   Social Drivers of Health   Financial Resource Strain: Not on file  Food Insecurity: No Food Insecurity (05/18/2024)   Hunger Vital Sign    Worried About Running Out of Food in the Last Year: Never true    Ran Out of Food in the Last Year: Never true  Transportation Needs: No Transportation Needs (05/18/2024)   PRAPARE - Administrator, Civil Service (Medical): No    Lack of Transportation (Non-Medical): No  Physical Activity: Not on file  Stress: Not on file  Social Connections: Unknown (05/18/2024)   Social Connection and Isolation Panel    Frequency of Communication with Friends and Family: Patient declined    Frequency of Social Gatherings with Friends and Family: Patient declined    Attends Religious Services: Not on Marketing executive or Organizations: Patient declined    Attends Banker Meetings: Patient declined    Marital Status: Patient declined     Review of Systems  Constitutional:  Positive for fever.  Gastrointestinal:  Positive for abdominal pain (RUQ, but somewhat improved).  Patient denies any headache, chest pain, shortness of breath, N/V, diarrhea. All other systems are negative.   Vital Signs: BP (!)  142/74 (BP Location: Left Arm)   Pulse (!) 105   Temp 99.2 F (37.3 C)   Resp 18   Ht 6' 1 (1.854 m)   Wt 200 lb 9.9 oz (91 kg)   SpO2 100%   BMI 26.47 kg/m   Advance Care Plan: The advanced care plan/surrogate decision maker was discussed at the time of visit and documented in the medical record.    Physical Exam Constitutional:      Appearance: Normal appearance. He is well-developed.  HENT:     Mouth/Throat:     Mouth: Mucous membranes are moist.     Pharynx: Oropharynx is clear.  Cardiovascular:     Rate and Rhythm: Normal rate and regular rhythm.  Pulmonary:     Effort: Pulmonary effort is normal.     Breath sounds: Normal breath sounds.  Abdominal:     General: Abdomen is flat.     Palpations: Abdomen is soft.  Tenderness: There is abdominal tenderness (RUQ).  Skin:    General: Skin is warm and dry.  Neurological:     Mental Status: He is alert and oriented to person, place, and time.  Psychiatric:        Behavior: Behavior normal.     Imaging: CT ABDOMEN PELVIS W CONTRAST Result Date: 05/19/2024 CLINICAL DATA:  Cholecystitis.  Awaiting percutaneous drain. EXAM: CT ABDOMEN AND PELVIS WITH CONTRAST TECHNIQUE: Multidetector CT imaging of the abdomen and pelvis was performed using the standard protocol following bolus administration of intravenous contrast. RADIATION DOSE REDUCTION: This exam was performed according to the departmental dose-optimization program which includes automated exposure control, adjustment of the mA and/or kV according to patient size and/or use of iterative reconstruction technique. CONTRAST:  OMNIPAQUE  IOHEXOL  300 MG/ML  SOLN COMPARISON:  Right upper quadrant ultrasound 05/18/2024, chest CT 05/06/2022, 11/15/2019 FINDINGS: Lower chest: Heart is normal size. Calcified plaque over the 3 vessel coronary arteries. Evidence of previous TAVR. Calcified plaque over the descending thoracic aorta. Moderate elevation of the left hemidiaphragm.  Mild bibasilar atelectatic change. Hepatobiliary: Few small hypodensities over the liver unchanged likely cysts. Slight nodular contour to liver unchanged which may be due to cirrhosis. Moderate cholelithiasis with wall thickening and adjacent inflammatory change compatible recent ultrasound findings of acute cholecystitis. Biliary tree is otherwise unremarkable. Pancreas: Normal. Spleen: Normal. Adrenals/Urinary Tract: Adrenal glands are normal. Kidneys are normal in size and demonstrate no hydronephrosis or nephrolithiasis. Ureters and bladder are normal. Stomach/Bowel: Stomach and small bowel are normal. Appendix is normal. Colon is unremarkable. Vascular/Lymphatic: Calcified plaque over the abdominal aorta which is normal caliber. Remaining vascular structures are unremarkable. No significant adenopathy. Reproductive: Prostate is unremarkable. Other: Minimal patchy free fluid over the mid abdomen/mesentery as well as right pericolic gutter and mild-to-moderate free fluid over the pelvis. Musculoskeletal: T12 compression fracture with slight interval progression. Minimal loss of vertebral body height of L1 described on prior lumbar spine series 2016 although no images for direct correlation. IMPRESSION: 1. Moderate cholelithiasis with wall thickening and adjacent inflammatory change compatible with recent ultrasound findings of acute cholecystitis. 2. Mild ascites. 3. Slight nodular contour to liver unchanged which may be due to cirrhosis. Few small hypodensities over the liver unchanged likely cysts. 4. T12 compression fracture with slight interval progression. Minimal loss of vertebral body height of L1 described on prior lumbar spine report, although no images for direct correlation. 5. Aortic atherosclerosis. Atherosclerotic coronary artery disease. Aortic Atherosclerosis (ICD10-I70.0). Electronically Signed   By: Toribio Agreste M.D.   On: 05/19/2024 09:29   US  Abdomen Limited RUQ (LIVER/GB) Result Date:  05/18/2024 CLINICAL DATA:  Upper abdominal pain. EXAM: ULTRASOUND ABDOMEN LIMITED RIGHT UPPER QUADRANT COMPARISON:  Chest CTA dated 05/06/2022 FINDINGS: Gallbladder: Multiple gallstones layering in the dependent portion of the gallbladder measuring up to 8 mm in maximum diameter each. Gallbladder wall thickening with a maximum thickness of 4.9 mm. Small amount of pericholecystic fluid. Positive sonographic Murphy sign. Common bile duct: Diameter: 2.5 mm Liver: Diffusely echogenic and heterogeneous with lobulated contours. Portal vein is patent on color Doppler imaging with normal direction of blood flow towards the liver. Other: None. IMPRESSION: 1. Cholelithiasis with gallbladder wall thickening, pericholecystic fluid and positive sonographic Murphy sign compatible with acute cholecystitis. 2. Diffusely echogenic and heterogeneous liver with lobulated contours. This is nonspecific but can be seen with cirrhosis. Electronically Signed   By: Elspeth Bathe M.D.   On: 05/18/2024 13:57    Labs:  CBC: Recent Labs  05/18/24 1010 05/19/24 0645  WBC 17.9* 19.3*  HGB 12.4* 11.8*  HCT 38.9* 36.0*  PLT 247 193    COAGS: Recent Labs    05/19/24 0916  INR 1.4*    BMP: Recent Labs    05/18/24 1010 05/19/24 0645  NA 133* 134*  K 4.0 4.0  CL 92* 97*  CO2 29 27  GLUCOSE 138* 90  BUN 25* 25*  CALCIUM  8.4* 8.1*  CREATININE 1.18 1.06  GFRNONAA >60 >60    LIVER FUNCTION TESTS: Recent Labs    05/18/24 1010 05/19/24 0645  BILITOT 2.2* 2.3*  AST 20 16  ALT 9 13  ALKPHOS 44 38  PROT 6.2* 5.9*  ALBUMIN 3.3* 2.9*    TUMOR MARKERS: No results for input(s): AFPTM, CEA, CA199, CHROMGRNA in the last 8760 hours.  Assessment and Plan:  Acute Cholecystitis: Kenneth Larsen is a 78 y.o. male with a history of CHF, COPD on 3L [place] at baseline, and aortic stenosis s/p TAVR in 2020 deemed a poor surgical candidate by surgery. Patient is currently admitted with acute cholecystitis. IR with  request for percutaneous cholecystostomy. Case and imaging reviewed and approved by Dr. VEAR Lent. Procedure to be performed under moderate sedation.  -NPO since midnight -INR 1.4; not on [facility] -Plan for percutaneous cholecystostomy in IR on 9/26  Risks and benefits discussed with the patient including, but not limited to bleeding, infection, gallbladder perforation, bile leak, sepsis or even death.  All of the patient's questions were answered, patient is agreeable to proceed. Consent signed and in chart.   Thank you for allowing our service to participate in Kenneth Larsen 's care.    Electronically Signed: Glennon CHRISTELLA Bal, PA-C   05/19/2024, 12:31 PM     I spent a total of 40 Minutes in face to face in clinical consultation, greater than 50% of which was counseling/coordinating care for percutaneous cholecystostomy tube placement.

## 2024-05-19 NOTE — Procedures (Signed)
 Interventional Radiology Procedure Note  Procedure: US  guided placement of a transperitoneal percutaneous cholecystostomy tube (33F [place]) for acute calculous cholecystitis  Complications: None  Estimated Blood Loss: None  Recommendations:  - Drain to gravity bag  Signed,  Wilkie LOIS Lent, MD

## 2024-05-20 DIAGNOSIS — K810 Acute cholecystitis: Secondary | ICD-10-CM | POA: Diagnosis not present

## 2024-05-20 LAB — CBC WITH DIFFERENTIAL/PLATELET
Abs Immature Granulocytes: 0.06 K/uL (ref 0.00–0.07)
Basophils Absolute: 0 K/uL (ref 0.0–0.1)
Basophils Relative: 0 %
Eosinophils Absolute: 0.2 K/uL (ref 0.0–0.5)
Eosinophils Relative: 2 %
HCT: 34.6 % — ABNORMAL LOW (ref 39.0–52.0)
Hemoglobin: 10.8 g/dL — ABNORMAL LOW (ref 13.0–17.0)
Immature Granulocytes: 1 %
Lymphocytes Relative: 7 %
Lymphs Abs: 0.8 K/uL (ref 0.7–4.0)
MCH: 29.5 pg (ref 26.0–34.0)
MCHC: 31.2 g/dL (ref 30.0–36.0)
MCV: 94.5 fL (ref 80.0–100.0)
Monocytes Absolute: 0.7 K/uL (ref 0.1–1.0)
Monocytes Relative: 5 %
Neutro Abs: 10.5 K/uL — ABNORMAL HIGH (ref 1.7–7.7)
Neutrophils Relative %: 85 %
Platelets: 239 K/uL (ref 150–400)
RBC: 3.66 MIL/uL — ABNORMAL LOW (ref 4.22–5.81)
RDW: 13.5 % (ref 11.5–15.5)
WBC: 12.3 K/uL — ABNORMAL HIGH (ref 4.0–10.5)
nRBC: 0 % (ref 0.0–0.2)

## 2024-05-20 LAB — BASIC METABOLIC PANEL WITH GFR
Anion gap: 7 (ref 5–15)
BUN: 33 mg/dL — ABNORMAL HIGH (ref 8–23)
CO2: 28 mmol/L (ref 22–32)
Calcium: 8.1 mg/dL — ABNORMAL LOW (ref 8.9–10.3)
Chloride: 97 mmol/L — ABNORMAL LOW (ref 98–111)
Creatinine, Ser: 1.29 mg/dL — ABNORMAL HIGH (ref 0.61–1.24)
GFR, Estimated: 56 mL/min — ABNORMAL LOW (ref >60)
Glucose, Bld: 115 mg/dL — ABNORMAL HIGH (ref 70–99)
Potassium: 3.6 mmol/L (ref 3.5–5.1)
Sodium: 132 mmol/L — ABNORMAL LOW (ref 135–145)

## 2024-05-20 MED ORDER — ENOXAPARIN SODIUM 40 MG/0.4ML IJ SOSY
40.0000 mg | PREFILLED_SYRINGE | INTRAMUSCULAR | Status: DC
  Administered 2024-05-20 – 2024-05-22 (×3): 40 mg via SUBCUTANEOUS
  Filled 2024-05-20 (×3): qty 0.4

## 2024-05-20 NOTE — Plan of Care (Signed)
  Problem: Clinical Measurements: Goal: Diagnostic test results will improve Outcome: Progressing Goal: Respiratory complications will improve Outcome: Progressing Goal: Cardiovascular complication will be avoided Outcome: Progressing   Problem: Activity: Goal: Risk for activity intolerance will decrease Outcome: Completed/Met

## 2024-05-20 NOTE — Progress Notes (Signed)
 Mobility Specialist Progress Note:    05/20/24 1353  Mobility  Activity Ambulated with assistance  Level of Assistance Standby assist, set-up cues, supervision of patient - no hands on  Assistive Device Front wheel walker  Distance Ambulated (ft) 200 ft  Range of Motion/Exercises Active;All extremities  Activity Response Tolerated well  Mobility visit 1 Mobility  Mobility Specialist Start Time (ACUTE ONLY) 1338  Mobility Specialist Stop Time (ACUTE ONLY) 1351  Mobility Specialist Time Calculation (min) (ACUTE ONLY) 13 min   Pt received in chair, agreeable to mobility. Required superviison to stand and ambulate with RW. Pt does not usually use AD at home, but used RW as first time ambulating post procedure. Tolerated well, SpO2 99% on 3L after ambulation. Returned to chair, belongings in reach. All needs met.  Sherrilee Ditty Mobility Specialist Please contact via Special educational needs teacher or  Rehab office at [phone]

## 2024-05-20 NOTE — Progress Notes (Signed)
 [facility] SURGICAL PROGRESS NOTE (cpt [phone])  Hospital Day(s): 2.   Interval History: Patient seen and examined, no acute events or new complaints overnight. Patient reports his pain may be slightly improved but certainly is still present. No fever, chills, nausea, emesis. His leukocytosis is better this AM;.  Bilirubin still elevated to 2.3. He is tolerating clear liquid diet. He continues on Cipro /Flagyl .   Review of Systems:  Constitutional: denies fever, chills  HEENT: denies cough or congestion  Respiratory: denies any shortness of breath  Cardiovascular: denies chest pain or palpitations  Gastrointestinal: + abdominal pain, denies N/V Genitourinary: denies burning with urination or urinary frequency Musculoskeletal: denies pain, decreased motor or sensation  Vital signs in last 24 hours: [min-max] current  Temp:  [97.5 F (36.4 C)-100.9 F (38.3 C)] 98.1 F (36.7 C) (09/27 0918) Pulse Rate:  [71-99] 84 (09/27 0918) Resp:  [16-30] 16 (09/27 0918) BP: (102-116)/(53-70) 110/55 (09/27 0918) SpO2:  [96 %-100 %] 100 % (09/27 0918) Weight:  [91 kg-95.1 kg] 95.1 kg (09/27 0331)     Height: 6' 1 (185.4 cm) Weight: 95.1 kg BMI (Calculated): 27.67   Intake/Output last 2 shifts:  09/26 0701 - 09/27 0700 In: 1231.8 [P.O.:480; IV Piggyback:741.8] Out: 500 [Urine:400; Drains:100]   Physical Exam:  Constitutional: alert, cooperative and no distress  HENT: normocephalic without obvious abnormality  Eyes: PERRL, EOM's grossly intact and symmetric  Respiratory: breathing non-labored at rest, on [place] Cardiovascular: regular rate and sinus rhythm  Gastrointestinal: soft, he remains mildly tender in RUQ, and non-distended.  Passive drainage bag with bilious fluid   Labs:     Latest Ref Rng & Units 05/20/2024   10:28 AM 05/19/2024    6:45 AM 05/18/2024   10:10 AM  CBC  WBC 4.0 - 10.5 K/uL 12.3  19.3  17.9   Hemoglobin 13.0 - 17.0 g/dL 89.1  88.1  87.5   Hematocrit 39.0  - 52.0 % 34.6  36.0  38.9   Platelets 150 - 400 K/uL 239  193  247       Latest Ref Rng & Units 05/20/2024   10:28 AM 05/19/2024    6:45 AM 05/18/2024   10:10 AM  CMP  Glucose 70 - 99 mg/dL 884  90  861   BUN 8 - 23 mg/dL 33  25  25   Creatinine 0.61 - 1.24 mg/dL 8.70  8.93  8.81   Sodium 135 - 145 mmol/L 132  134  133   Potassium 3.5 - 5.1 mmol/L 3.6  4.0  4.0   Chloride 98 - 111 mmol/L 97  97  92   CO2 22 - 32 mmol/L 28  27  29    Calcium  8.9 - 10.3 mg/dL 8.1  8.1  8.4   Total Protein 6.5 - 8.1 g/dL  5.9  6.2   Total Bilirubin 0.0 - 1.2 mg/dL  2.3  2.2   Alkaline Phos 38 - 126 U/L  38  44   AST 15 - 41 U/L  16  20   ALT 0 - 44 U/L  13  9      Imaging studies: No new pertinent imaging studies   Assessment/Plan: 78 y.o. male with acute cholecystitis, complicated by pertinent comorbidities including COPD on baseline O2, CHF, CAD, history of TAVR.    - Appreciate IR regarding percutaneous cholecystotomy tube placement.   - Reviewed MRCP report with patient and his wife. - Continue IV Abx (Cipro /Flagyl )             -  Monitor leukocytosis;   - Monitor hyperbilirubinemia              - Monitor abdominal examination; on-going bowel function   - Further management per primary service; we will follow   All of the above findings and recommendations were discussed with the patient, patient's family (wife at bedside), and the medical team, and all of patient's and family's questions were answered to his/her/their expressed satisfaction.  -- Honor Leghorn, M.D.  [facility] 05/20/2024, 12:14 PM

## 2024-05-20 NOTE — Progress Notes (Signed)
 Nursing staff demonstrated how to flush drainage tube and how to empty drainage bag. Verbalized understanding. Encouraged family to watch staff when performing these tasks.

## 2024-05-20 NOTE — Progress Notes (Addendum)
 Progress Note   Patient: Kenneth Larsen FMW:[id number] DOB: 01/23/1946 DOA: 05/18/2024     2 DOS: the patient was seen and examined on 05/20/2024   Brief hospital course:  HPI: Kenneth Larsen is a 78 y.o. male with medical history significant of hypertension, hypothyroidism, severe aortic stenosis status post TAVR, chronic diastolic CHF, obesity, COPD, chronic respiratory failure with hypoxia respiratory failure on 2-3L presenting with abdominal pain.   Patient reports 3 days of right upper quadrant pain.   Patient denies fevers, chills, chest pain, shortness of breath, constipation, diarrhea, nausea, vomiting.   ED Course: Vital signs in ED stable.   Lab workup notable for CMP with sodium 133, chloride 92, BUN 25, glucose 138, calcium  8.4, protein 6.2, albumin 3.3, T. bili 2.2.  CBC with leukocytosis to 17.9, hemoglobin stable at 12.4.  Lipase normal.  Urinalysis with protein and rare bacteria only.   Right upper quadrant ultrasound showed gallstones and changes consistent with acute cholecystitis.  Nonspecific liver changes also noted that could be consistent with cirrhosis.   Patient sees ciprofloxacin , Flagyl  in the ED.  Also received morphine , 500 cc IV fluid, Zofran .   General surgery consulted and are following.      Assessment and Plan:  Acute cholecystitis Patient presented with 3 days of right upper quadrant abdominal pain.   Ultrasound showed changes consistent with gallstones and acute cholecystitis. Leukocytosis to 17.9 >> 19.3 >> 12.3.  Afebrile MRCP shows findings of acute cholecystitis complicated by contained perforation. Intrinsic T1 hyperintensity along the gallbladder wall and surrounding the perforation may represent hemorrhage. Mild intrahepatic bile duct dilation to the level of the hepatic hilum, where there are extensive inflammatory changes related to the cholecystitis. No extrahepatic bile duct dilation. No substantial peribiliary enhancement to suggest  cholangitis. Small volume ascites. Appreciate surgery and IR input.  Patient is status post cholecystostomy tube insertion (POD 1) Continue empiric antibiotic therapy with ciprofloxacin  and Flagyl      Hypertension - Continue  losartan  and metoprolol    Hypothyroidism - Continue Synthroid    Severe aortic stenosis - Status post TAVR   Chronic diastolic CHF - Not acutely exacerbated - Continue  losartan , metoprolol  -Hold Lasix  due to AKI   Obesity - Complicates overall prognosis and care -Lifestyle modification and exercise has been discussed with patient   OSA - BiPAP qhs   COPD Chronic respiratory with hypoxia - On chronic 2-3L at home. - Continue as needed bronchodilator therapy as well as inhaled steroids   AKI Most likely secondary to poor oral intake Patient noted to have a bump in his creatinine from 1.06 >> 1.29 Hold furosemide  Monitor renal function closely   09/26 MRCP plan for today.  IR consult for cholecystostomy tube placement  09/27 -Afebrile.  Tolerating his diet.  PT evaluation          Subjective: Improved right upper quadrant pain.  Physical Exam: Vitals:   05/19/24 1448 05/19/24 1955 05/20/24 0331 05/20/24 0918  BP: (!) 113/58 116/60 (!) 104/54 (!) 110/55  Pulse: 96 84 78 84  Resp: 16 20 16 16   Temp: (!) 100.9 F (38.3 C) 99.7 F (37.6 C) (!) 97.5 F (36.4 C) 98.1 F (36.7 C)  TempSrc: Oral Oral Oral Oral  SpO2: 98% 98% 99% 100%  Weight:   95.1 kg   Height:       Constitutional:      General: He is not in acute distress.    Appearance: Normal appearance.  HENT:  Head: Normocephalic and atraumatic.     Mouth/Throat:     Mouth: Mucous membranes are moist.     Pharynx: Oropharynx is clear.  Eyes:     Extraocular Movements: Extraocular movements intact.     Pupils: Pupils are equal, round, and reactive to light.  Cardiovascular:     Rate and Rhythm: Normal rate and regular rhythm.     Pulses: Normal pulses.     Heart  sounds: Normal heart sounds.  Pulmonary:     Effort: Pulmonary effort is normal. No respiratory distress.     Breath sounds: Normal breath sounds.  Abdominal:     General: Bowel sounds are normal. There is no distension.     Palpations: Abdomen is soft.  Cholecystostomy tube in right upper quadrant    Tenderness: Improved abdominal tenderness.  Musculoskeletal:        General: No swelling or deformity.  Skin:    General: Skin is warm and dry.  Neurological:     General: No focal deficit present.     Mental Status: Mental status is at baseline.       Data Reviewed: Sodium 132, potassium 3.6, chloride 97, bicarb 28, glucose 115, BUN 33, creatinine 1.29, calcium  8.1, white count 12.3, hemoglobin 10.8, hematocrit 34.6, platelet count 239 Labs reviewed  Family Communication: Plan of care discussed with patient and his wife at the bedside.  All questions and concerns have been addressed.  They verbalized understanding and agreed with the plan.  Disposition: Status is: Inpatient Remains inpatient appropriate because: On IV antibiotics  Planned Discharge Destination: Home with Home Health    Time spent: 50 minutes  Author: Aimee Somerset, MD 05/20/2024 2:03 PM  For on call review [email or web address].

## 2024-05-21 DIAGNOSIS — K810 Acute cholecystitis: Secondary | ICD-10-CM | POA: Diagnosis not present

## 2024-05-21 LAB — CBC WITH DIFFERENTIAL/PLATELET
Abs Immature Granulocytes: 0.03 K/uL (ref 0.00–0.07)
Basophils Absolute: 0 K/uL (ref 0.0–0.1)
Basophils Relative: 0 %
Eosinophils Absolute: 0.2 K/uL (ref 0.0–0.5)
Eosinophils Relative: 3 %
HCT: 25 % — ABNORMAL LOW (ref 39.0–52.0)
Hemoglobin: 8 g/dL — ABNORMAL LOW (ref 13.0–17.0)
Immature Granulocytes: 0 %
Lymphocytes Relative: 9 %
Lymphs Abs: 0.6 K/uL — ABNORMAL LOW (ref 0.7–4.0)
MCH: 30.8 pg (ref 26.0–34.0)
MCHC: 32 g/dL (ref 30.0–36.0)
MCV: 96.2 fL (ref 80.0–100.0)
Monocytes Absolute: 0.6 K/uL (ref 0.1–1.0)
Monocytes Relative: 9 %
Neutro Abs: 5.3 K/uL (ref 1.7–7.7)
Neutrophils Relative %: 79 %
Platelets: 199 K/uL (ref 150–400)
RBC: 2.6 MIL/uL — ABNORMAL LOW (ref 4.22–5.81)
RDW: 13.7 % (ref 11.5–15.5)
WBC: 6.7 K/uL (ref 4.0–10.5)
nRBC: 0 % (ref 0.0–0.2)

## 2024-05-21 LAB — BASIC METABOLIC PANEL WITH GFR
Anion gap: 10 (ref 5–15)
BUN: 46 mg/dL — ABNORMAL HIGH (ref 8–23)
CO2: 26 mmol/L (ref 22–32)
Calcium: 8.3 mg/dL — ABNORMAL LOW (ref 8.9–10.3)
Chloride: 97 mmol/L — ABNORMAL LOW (ref 98–111)
Creatinine, Ser: 1.22 mg/dL (ref 0.61–1.24)
GFR, Estimated: >60 mL/min (ref >60)
Glucose, Bld: 101 mg/dL — ABNORMAL HIGH (ref 70–99)
Potassium: 3.7 mmol/L (ref 3.5–5.1)
Sodium: 133 mmol/L — ABNORMAL LOW (ref 135–145)

## 2024-05-21 LAB — MAGNESIUM: Magnesium: 2.3 mg/dL (ref 1.7–2.4)

## 2024-05-21 MED ORDER — POTASSIUM CHLORIDE CRYS ER 20 MEQ PO TBCR
40.0000 meq | EXTENDED_RELEASE_TABLET | Freq: Once | ORAL | Status: AC
  Administered 2024-05-21: 40 meq via ORAL
  Filled 2024-05-21: qty 2

## 2024-05-21 NOTE — Progress Notes (Signed)
 Progress Note   Patient: Kenneth Larsen FMW:[id number] DOB: [birth year] DOA: 05/18/2024     3 DOS: the patient was seen and examined on 05/21/2024   Brief hospital course:  HPI: Kenneth Larsen is a 78 y.o. male with medical history significant of hypertension, hypothyroidism, severe aortic stenosis status post TAVR, chronic diastolic CHF, obesity, COPD, chronic respiratory failure with hypoxia respiratory failure on 2-3L presenting with abdominal pain.   Patient reports 3 days of right upper quadrant pain.   Patient denies fevers, chills, chest pain, shortness of breath, constipation, diarrhea, nausea, vomiting.   ED Course: Vital signs in ED stable.   Lab workup notable for CMP with sodium 133, chloride 92, BUN 25, glucose 138, calcium  8.4, protein 6.2, albumin 3.3, T. bili 2.2.  CBC with leukocytosis to 17.9, hemoglobin stable at 12.4.  Lipase normal.  Urinalysis with protein and rare bacteria only.   Right upper quadrant ultrasound showed gallstones and changes consistent with acute cholecystitis.  Nonspecific liver changes also noted that could be consistent with cirrhosis.   Patient sees ciprofloxacin , Flagyl  in the ED.  Also received morphine , 500 cc IV fluid, Zofran .   General surgery consulted and are following.    09/26 MRCP plan for today.  IR consult for cholecystostomy tube placement   09/27 -Afebrile.  Tolerating his diet.  PT evaluation  09/28 -feels better.  Requesting for his diet to be advanced   Assessment and Plan:  Acute cholecystitis Patient presented with 3 days of right upper quadrant abdominal pain.   Ultrasound showed changes consistent with gallstones and acute cholecystitis. Leukocytosis to 17.9 >> 19.3 >> 12.3.  Afebrile MRCP shows findings of acute cholecystitis complicated by contained perforation. Intrinsic T1 hyperintensity along the gallbladder wall and surrounding the perforation may represent hemorrhage. Mild intrahepatic bile duct dilation to the  level of the hepatic hilum, where there are extensive inflammatory changes related to the cholecystitis. No extrahepatic bile duct dilation. No substantial peribiliary enhancement to suggest cholangitis. Small volume ascites. Appreciate surgery and IR input.  Patient is status post cholecystostomy tube insertion (POD 2) Continue empiric antibiotic therapy with ciprofloxacin  and Flagyl  Surgical culture is pending     Hypertension - Continue  losartan  and metoprolol    Hypothyroidism - Continue Synthroid    Severe aortic stenosis - Status post TAVR   Chronic diastolic CHF - Not acutely exacerbated - Continue  losartan , metoprolol  -Hold Lasix  due to AKI   Obesity - Complicates overall prognosis and care -Lifestyle modification and exercise has been discussed with patient   OSA - BiPAP qhs   COPD Chronic respiratory with hypoxia - On chronic 2-3L at home. - Continue as needed bronchodilator therapy as well as inhaled steroids     AKI Most likely secondary to poor oral intake Patient noted to have a bump in his creatinine from 1.06 >> 1.29 Hold furosemide  Monitor renal function closely          Subjective: Feels better.  Ambulated with PT  Physical Exam: Vitals:   05/21/24 0403 05/21/24 0545 05/21/24 0809 05/21/24 1148  BP: (!) 114/56  (!) 123/56 (!) 113/52  Pulse: 78  90 75  Resp: (!) 21  17 19   Temp: 97.9 F (36.6 C)  98.3 F (36.8 C) 99 F (37.2 C)  TempSrc:   Oral Oral  SpO2: 95%  98% 100%  Weight:  94.8 kg    Height:       Constitutional:      General: He is not  in acute distress.    Appearance: Normal appearance.  HENT:     Head: Normocephalic and atraumatic.     Mouth/Throat:     Mouth: Mucous membranes are moist.     Pharynx: Oropharynx is clear.  Eyes:     Extraocular Movements: Extraocular movements intact.     Pupils: Pupils are equal, round, and reactive to light.  Cardiovascular:     Rate and Rhythm: Normal rate and regular rhythm.      Pulses: Normal pulses.     Heart sounds: Normal heart sounds.  Pulmonary:     Effort: Pulmonary effort is normal. No respiratory distress.     Breath sounds: Normal breath sounds.  Abdominal:     General: Bowel sounds are normal. There is no distension.     Palpations: Abdomen is soft.  Cholecystostomy tube in right upper quadrant    Tenderness: Improved abdominal tenderness.  Musculoskeletal:        General: No swelling or deformity.  Skin:    General: Skin is warm and dry.  Neurological:     General: No focal deficit present.     Mental Status: Mental status is at baseline.       Data Reviewed:  There are no new results to review at this time.  Family Communication: Plan of care discussed with patient and his wife at the bedside.  Verbalized understanding and agree with the plan  Disposition: Status is: Inpatient Remains inpatient appropriate because: On IV antibiotics  Planned Discharge Destination: Home with Home Health    Time spent: 40 minutes  Author: Aimee Somerset, MD 05/21/2024 12:02 PM  For on call review [email or web address].

## 2024-05-21 NOTE — Progress Notes (Signed)
   05/20/24 2119  Assess: MEWS Score  Temp 98.5 F (36.9 C)  BP (!) 100/57  MAP (mmHg) 68  Pulse Rate 94  Resp (!) 23  SpO2 96 %  O2 Device Bi-PAP  Assess: MEWS Score  MEWS Temp 0  MEWS Systolic 1  MEWS Pulse 0  MEWS RR 1  MEWS LOC 0  MEWS Score 2  MEWS Score Color Yellow  Assess: if the MEWS score is Yellow or Red  Were vital signs accurate and taken at a resting state? Yes  Does the patient meet 2 or more of the SIRS criteria? Yes  Does the patient have a confirmed or suspected source of infection? No  MEWS guidelines implemented  Yes, yellow  Treat  MEWS Interventions Considered administering scheduled or prn medications/treatments as ordered  Take Vital Signs  Increase Vital Sign Frequency  Yellow: Q2hr x1, continue Q4hrs until patient remains green for 12hrs  Escalate  MEWS: Escalate Yellow: Discuss with charge nurse and consider notifying provider and/or RRT  Notify: Charge Nurse/RN  Name of Charge Nurse/RN Notified Manuelita, RN  Provider Notification  Provider Name/Title Dr. Cleatus  Date Provider Notified 05/20/24  Time Provider Notified 2328  Method of Notification Page (secure chat)  Notification Reason Change in status (yellow mews)  Provider response No new orders  Date of Provider Response 05/20/24  Time of Provider Response 2343  Assess: SIRS CRITERIA  SIRS Temperature  0  SIRS Respirations  1  SIRS Pulse 1  SIRS WBC 0  SIRS Score Sum  2

## 2024-05-21 NOTE — Progress Notes (Signed)
 [facility] SURGICAL PROGRESS NOTE (cpt [phone])  Hospital Day(s): 3.   Interval History: Patient seen and examined, no acute events or new complaints overnight. Patient reports his pain may be slightly improved but certainly is still present. No fever, chills, nausea, emesis. His leukocytosis is better this AM;.  Bilirubin still elevated to 2.3. He is tolerating clear liquid diet. He continues on Cipro /Flagyl .   Review of Systems:  Constitutional: denies fever, chills  HEENT: denies cough or congestion  Respiratory: denies any shortness of breath  Cardiovascular: denies chest pain or palpitations  Gastrointestinal: + abdominal pain, denies N/V Genitourinary: denies burning with urination or urinary frequency Musculoskeletal: denies pain, decreased motor or sensation  Vital signs in last 24 hours: [min-max] current  Temp:  [97.9 F (36.6 C)-99.4 F (37.4 C)] 99 F (37.2 C) (09/28 1148) Pulse Rate:  [75-111] 75 (09/28 1148) Resp:  [17-23] 19 (09/28 1148) BP: (100-126)/(52-58) 113/52 (09/28 1148) SpO2:  [93 %-100 %] 100 % (09/28 1148) Weight:  [94.8 kg] 94.8 kg (09/28 0545)     Height: 6' 1 (185.4 cm) Weight: 94.8 kg BMI (Calculated): 27.58   Intake/Output last 2 shifts:  09/27 0701 - 09/28 0700 In: 485 [P.O.:480] Out: 200 [Urine:150; Drains:50]   Physical Exam:  Constitutional: alert, cooperative and no distress  HENT: normocephalic without obvious abnormality  Eyes: PERRL, EOM's grossly intact and symmetric  Respiratory: breathing non-labored at rest, on [place] Cardiovascular: regular rate and sinus rhythm  Gastrointestinal: soft, less tender in RUQ, and non-distended.  Passive drainage bag with bilious fluid   Labs:     Latest Ref Rng & Units 05/21/2024   11:26 AM 05/20/2024   10:28 AM 05/19/2024    6:45 AM  CBC  WBC 4.0 - 10.5 K/uL 6.7  12.3  19.3   Hemoglobin 13.0 - 17.0 g/dL 8.0  89.1  88.1   Hematocrit 39.0 - 52.0 % 25.0  34.6  36.0   Platelets 150 -  400 K/uL 199  239  193       Latest Ref Rng & Units 05/21/2024    1:20 PM 05/20/2024   10:28 AM 05/19/2024    6:45 AM  CMP  Glucose 70 - 99 mg/dL 898  884  90   BUN 8 - 23 mg/dL 46  33  25   Creatinine 0.61 - 1.24 mg/dL 8.77  8.70  8.93   Sodium 135 - 145 mmol/L 133  132  134   Potassium 3.5 - 5.1 mmol/L 3.7  3.6  4.0   Chloride 98 - 111 mmol/L 97  97  97   CO2 22 - 32 mmol/L 26  28  27    Calcium  8.9 - 10.3 mg/dL 8.3  8.1  8.1   Total Protein 6.5 - 8.1 g/dL   5.9   Total Bilirubin 0.0 - 1.2 mg/dL   2.3   Alkaline Phos 38 - 126 U/L   38   AST 15 - 41 U/L   16   ALT 0 - 44 U/L   13      Imaging studies: No new pertinent imaging studies   Assessment/Plan: 78 y.o. male with acute cholecystitis, complicated by pertinent comorbidities including COPD on baseline O2, CHF, CAD, history of TAVR.    - Appreciate IR regarding percutaneous cholecystotomy tube placement.  - Continue IV Abx (Cipro /Flagyl ), may transition to p.o. antibiotics along with his tolerance of diet and anticipate discharge soon.             -  leukocytosis, now resolved and normal.  - Monitor hyperbilirubinemia              - Monitor abdominal examination; on-going bowel function   - Bilirubin has not completely normalized, but based on MRCP would expect it to continue a downward trend, this can be reevaluated as outpatient.  - I believe we can anticipate discharge soon. All of the above findings and recommendations were discussed with the patient, patient's family (wife at bedside), and the medical team, and all of patient's and family's questions were answered to his/her/their expressed satisfaction.  -- Honor Leghorn, M.D.  [facility] 05/21/2024, 3:15 PM

## 2024-05-21 NOTE — Progress Notes (Signed)
 Mobility Specialist Progress Note:    05/21/24 1100  Mobility  Activity Ambulated with assistance  Level of Assistance Standby assist, set-up cues, supervision of patient - no hands on  Assistive Device Front wheel walker  Distance Ambulated (ft) 200 ft  Range of Motion/Exercises Active;All extremities  Activity Response Tolerated well  Mobility visit 1 Mobility  Mobility Specialist Start Time (ACUTE ONLY) 1039  Mobility Specialist Stop Time (ACUTE ONLY) 1051  Mobility Specialist Time Calculation (min) (ACUTE ONLY) 12 min   Pt asx throughout mobility session. Left pt sitting EOB with family, all needs met.  Sherrilee Ditty Mobility Specialist Please contact via Special educational needs teacher or  Rehab office at [phone]

## 2024-05-21 NOTE — Plan of Care (Signed)
  Problem: Health Behavior/Discharge Planning: Goal: Ability to manage health-related needs will improve Outcome: Progressing   Problem: Clinical Measurements: Goal: Ability to maintain clinical measurements within normal limits will improve Outcome: Progressing Goal: Will remain free from infection Outcome: Progressing Goal: Diagnostic test results will improve Outcome: Progressing Goal: Respiratory complications will improve Outcome: Progressing   Problem: Nutrition: Goal: Adequate nutrition will be maintained Outcome: Progressing

## 2024-05-22 ENCOUNTER — Other Ambulatory Visit: Payer: Self-pay

## 2024-05-22 DIAGNOSIS — Z880 Allergy status to penicillin: Secondary | ICD-10-CM

## 2024-05-22 DIAGNOSIS — K8000 Calculus of gallbladder with acute cholecystitis without obstruction: Principal | ICD-10-CM

## 2024-05-22 DIAGNOSIS — K810 Acute cholecystitis: Secondary | ICD-10-CM | POA: Diagnosis not present

## 2024-05-22 DIAGNOSIS — B3789 Other sites of candidiasis: Secondary | ICD-10-CM

## 2024-05-22 LAB — IRON AND TIBC
Iron: 32 ug/dL — ABNORMAL LOW (ref 45–182)
Saturation Ratios: 15 % — ABNORMAL LOW (ref 17.9–39.5)
TIBC: 213 ug/dL — ABNORMAL LOW (ref 250–450)
UIBC: 181 ug/dL

## 2024-05-22 MED ORDER — CIPROFLOXACIN HCL 500 MG PO TABS
500.0000 mg | ORAL_TABLET | Freq: Two times a day (BID) | ORAL | Status: DC
  Administered 2024-05-22 – 2024-05-23 (×3): 500 mg via ORAL
  Filled 2024-05-22 (×3): qty 1

## 2024-05-22 MED ORDER — SODIUM CHLORIDE FLUSH 0.9 % IV SOLN
5.0000 mL | INTRAVENOUS | 0 refills | Status: AC | PRN
  Filled 2024-05-22: qty 300, 30d supply, fill #0

## 2024-05-22 MED ORDER — HYDRALAZINE HCL 50 MG PO TABS
50.0000 mg | ORAL_TABLET | Freq: Three times a day (TID) | ORAL | Status: DC | PRN
  Administered 2024-05-22: 50 mg via ORAL
  Filled 2024-05-22: qty 1

## 2024-05-22 MED ORDER — METRONIDAZOLE 500 MG PO TABS
500.0000 mg | ORAL_TABLET | Freq: Three times a day (TID) | ORAL | Status: DC
  Administered 2024-05-22 – 2024-05-23 (×4): 500 mg via ORAL
  Filled 2024-05-22 (×4): qty 1

## 2024-05-22 MED ORDER — FLUCONAZOLE IN SODIUM CHLORIDE 400-0.9 MG/200ML-% IV SOLN
400.0000 mg | Freq: Every day | INTRAVENOUS | Status: DC
  Filled 2024-05-22: qty 200

## 2024-05-22 MED ORDER — FLUCONAZOLE 100 MG PO TABS
800.0000 mg | ORAL_TABLET | Freq: Once | ORAL | Status: AC
  Administered 2024-05-22: 800 mg via ORAL
  Filled 2024-05-22 (×2): qty 8

## 2024-05-22 MED ORDER — SODIUM CHLORIDE 0.9% FLUSH
5.0000 mL | Freq: Every day | INTRAVENOUS | 1 refills | Status: DC
  Filled 2024-05-22: qty 300, 30d supply, fill #0

## 2024-05-22 MED ORDER — FLUCONAZOLE 100 MG PO TABS
400.0000 mg | ORAL_TABLET | Freq: Every day | ORAL | Status: DC
  Administered 2024-05-23: 400 mg via ORAL
  Filled 2024-05-22: qty 4

## 2024-05-22 NOTE — Progress Notes (Signed)
 Mobility Specialist Progress Note:    05/22/24 1042  Mobility  Activity Ambulated with assistance  Level of Assistance Standby assist, set-up cues, supervision of patient - no hands on  Assistive Device Front wheel walker  Distance Ambulated (ft) 200 ft  Range of Motion/Exercises Active;All extremities  Activity Response Tolerated well  Mobility visit 1 Mobility  Mobility Specialist Start Time (ACUTE ONLY) 1028  Mobility Specialist Stop Time (ACUTE ONLY) 1042  Mobility Specialist Time Calculation (min) (ACUTE ONLY) 14 min   Pt asx throughout mobility, requires supervision to stand and ambulate with RW. Returned to chair, all needs met.   Sherrilee Ditty Mobility Specialist Please contact via Special educational needs teacher or  Rehab office at [phone]

## 2024-05-22 NOTE — Consult Note (Signed)
 NAME: Kenneth Larsen  DOB: 1946/03/24  MRN: [id number]  Date/Time: 05/22/2024 1:34 PM  REQUESTING PROVIDER: Dr>Agbata Subjective:  REASON FOR CONSULT: candida cholecystitis ? Kenneth Larsen is a 78 y.o. male with a history of TAVR, chronic HFpEF, CAD, COPD, hypothyroidism presented with 3 days of acute right right-sided abdominal pain on 05/18/2024. He was initially seen in the [facility] urgent care and was asked to go to the ED  05/18/24 10:07  BP 122/57 !  Temp 98.4 F (36.9 C)  Pulse Rate 92  Resp 20  SpO2 99 %    Latest Reference Range & Units 05/18/24 10:10  WBC 4.0 - 10.5 K/uL 17.9 (H)  Hemoglobin 13.0 - 17.0 g/dL 87.5 (L)  HCT 60.9 - 47.9 % 38.9 (L)  Platelets 150 - 400 K/uL 247  Creatinine 0.61 - 1.24 mg/dL 8.81   Ultrasound of the abdomen showed multiple gallstones layering in the dependent portion of the gallbladder and there was gallbladder wall thickening with a maximal thickness of 4.9 mm.  There was small amount of pericholecystic fluid.  And positive sonographic Murphy sign. A CT abdomen and pelvis showed slightly nodular contour to the liver, moderate cholelithiasis with wall thickening and adjacent inflammatory changes compatible with ultrasound findings of acute cholecystitis.  There was calcified plaque over the descending thoracic aorta.  There was minimal patchy free fluid over the mid abdomen and mesentery as well as the right pericolic gutter with mild to moderate free fluid over the pelvis.  There was also T12 compression fracture with slight interval progression.  And minimal loss of vertebral body height of L1 described like in previous scan. Patient was seen by surgeon who recommended IR place drain in the gallbladder and a percutaneous cholecystostomy was done on 05/19/2024. Patient has been on ciprofloxacin  and Flagyl . I am asked to see the patient as the bile culture is growing abundant Candida albicans. No blood cultures were sent on admission Patient  states he is feeling better He is eating No nausea or vomiting  Past Medical History:  Diagnosis Date   Acute respiratory failure with hypoxia (HCC) 05/07/2022   Arthritis    CAD (coronary artery disease)    a. 09/2023 Cath: LM 15d, LAD 40p, LCX 86m, OM1 min irregs, RCA 40p, 30d.   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 04/2022 Echo: EF 55-60%, no rwma, Gri DD, nl RV fxn, RVSP 28.16mmHg. No AS/AI.    Past Surgical History:  Procedure Laterality Date   CARDIAC CATHETERIZATION     IR PERC CHOLECYSTOSTOMY  05/19/2024   RIGHT/LEFT HEART CATH AND CORONARY ANGIOGRAPHY N/A 10/13/2018   Procedure: RIGHT/LEFT HEART CATH AND CORONARY ANGIOGRAPHY;  Surgeon: Mady Bruckner, MD;  Location: [facility] INVASIVE CV LAB;  Service: Cardiovascular;  Laterality: N/A;   TEE WITHOUT CARDIOVERSION N/A 11/15/2018   Procedure: TRANSESOPHAGEAL ECHOCARDIOGRAM (TEE);  Surgeon: Verlin Bruckner BIRCH, MD;  Location: [facility] INVASIVE CV LAB;  Service: Open Heart Surgery;  Laterality: N/A;   TRANSCATHETER AORTIC VALVE REPLACEMENT, TRANSFEMORAL  11/15/2018   TRANSCATHETER AORTIC VALVE REPLACEMENT, TRANSFEMORAL N/A 11/15/2018   Procedure: TRANSCATHETER AORTIC VALVE REPLACEMENT, TRANSFEMORAL;  Surgeon: Verlin Lonni BIRCH, MD;  Location: [facility] INVASIVE CV LAB;  Service: Open Heart Surgery;  Laterality: N/A;   VASECTOMY      Social History   Socioeconomic History   Marital status: Married    Spouse name: Not on file   Number of children: 3   Years of education: Not on file   Highest education level: Not on file  Occupational History   Occupation: Retired-Textiles  Tobacco Use   Smoking status: Former     Current packs/day: 1.00    Average packs/day: 1 pack/day for 25.0 years (25.0 ttl pk-yrs)    Types: Cigarettes   Smokeless tobacco: Never  Vaping Use   Vaping status: Never Used  Substance and Sexual Activity   Alcohol use: Never   Drug use: Never   Sexual activity: Not on file  Other Topics Concern   Not on file  Social History Narrative   Not on file   Social Drivers of Health   Financial Resource Strain: Not on file  Food Insecurity: No Food Insecurity (05/18/2024)   Hunger Vital Sign    Worried About Running Out of Food in the Last Year: Never true    Ran Out of Food in the Last Year: Never true  Transportation Needs: No Transportation Needs (05/18/2024)   PRAPARE - Administrator, Civil Service (Medical): No    Lack of Transportation (Non-Medical): No  Physical Activity: Not on file  Stress: Not on file  Social Connections: Unknown (05/18/2024)   Social Connection and Isolation Panel    Frequency of Communication with Friends and Family: Patient declined    Frequency of Social Gatherings with Friends and Family: Patient declined    Attends Religious Services: Not on Marketing executive or Organizations: Patient declined    Attends Banker Meetings: Patient declined    Marital Status: Patient declined  Intimate Partner Violence: Not At Risk (05/18/2024)   Humiliation, Afraid, Rape, and Kick questionnaire    Fear of Current or Ex-Partner: No    Emotionally Abused: No    Physically Abused: No    Sexually Abused: No    Family History  Problem Relation Age of Onset   Cancer Father    Allergies  Allergen Reactions   Penicillins Hives    Did it involve swelling of the face/tongue/throat, SOB, or low BP? No Did it involve sudden or severe rash/hives, skin peeling, or any reaction on the inside of your mouth or nose? No Did you need to seek medical attention at a hospital or doctor's office? No When did it last happen?      childhood  allergy If all above answers are NO, may proceed with cephalosporin use.    I? Current Facility-Administered Medications  Medication Dose Route Frequency Provider Last Rate Last Admin   acetaminophen  (TYLENOL ) tablet 650 mg  650 mg Oral Q6H PRN Melvin, Alexander B, MD   650 mg at 05/18/24 2009   Or   acetaminophen  (TYLENOL ) suppository 650 mg  650 mg Rectal Q6H PRN Seena Marsa NOVAK, MD       atorvastatin  (LIPITOR) tablet 40 mg  40 mg Oral Daily Melvin, Alexander B, MD   40 mg at 05/22/24 9080   ciprofloxacin  (CIPRO ) tablet 500 mg  500 mg Oral BID Agbata, Tochukwu, MD   500 mg at 05/22/24 1107   enoxaparin  (LOVENOX ) injection 40 mg  40 mg Subcutaneous Q24H Agbata, Tochukwu, MD   40  mg at 05/21/24 2045   fluconazole (DIFLUCAN) IVPB 400 mg  400 mg Intravenous Q1400 Fayette Bodily, MD       fluticasone  furoate-vilanterol (BREO ELLIPTA ) 200-25 MCG/ACT 1 puff  1 puff Inhalation Daily Seena Marsa NOVAK, MD   1 puff at 05/22/24 0920   HYDROcodone -acetaminophen  (NORCO/VICODIN) 5-325 MG per tablet 1 tablet  1 tablet Oral Q4H PRN Seena Marsa NOVAK, MD       HYDROmorphone  (DILAUDID ) injection 0.5 mg  0.5 mg Intravenous Q3H PRN Seena Marsa NOVAK, MD       levothyroxine  (SYNTHROID ) tablet 50 mcg  50 mcg Oral QAC breakfast Seena Marsa NOVAK, MD   50 mcg at 05/22/24 9488   losartan  (COZAAR ) tablet 50 mg  50 mg Oral Daily Melvin, Alexander B, MD   50 mg at 05/22/24 9080   metoprolol  succinate (TOPROL -XL) 24 hr tablet 25 mg  25 mg Oral Daily Melvin, Alexander B, MD   25 mg at 05/22/24 9080   metroNIDAZOLE  (FLAGYL ) tablet 500 mg  500 mg Oral Q8H Agbata, Tochukwu, MD       polyethylene glycol (MIRALAX  / GLYCOLAX ) packet 17 g  17 g Oral Daily PRN Seena Marsa NOVAK, MD       sodium chloride  flush (NS) 0.9 % injection 3 mL  3 mL Intravenous Q12H Seena Marsa NOVAK, MD   3 mL at 05/22/24 9076   sodium chloride  flush (NS) 0.9 % injection 5 mL  5 mL Intracatheter Q8H Karalee Wilkie POUR, MD   5  mL at 05/22/24 9488     Abtx:  Anti-infectives (From admission, onward)    Start     Dose/Rate Route Frequency Ordered Stop   05/22/24 1430  fluconazole (DIFLUCAN) IVPB 400 mg        400 mg 100 mL/hr over 120 Minutes Intravenous Daily 05/22/24 1331     05/22/24 1400  metroNIDAZOLE  (FLAGYL ) tablet 500 mg        500 mg Oral Every 8 hours 05/22/24 0959     05/22/24 1045  ciprofloxacin  (CIPRO ) tablet 500 mg        500 mg Oral 2 times daily 05/22/24 0959     05/19/24 0600  ciprofloxacin  (CIPRO ) IVPB 400 mg  Status:  Discontinued        400 mg 200 mL/hr over 60 Minutes Intravenous Every 12 hours 05/18/24 1548 05/22/24 0959   05/18/24 2200  metroNIDAZOLE  (FLAGYL ) IVPB 500 mg  Status:  Discontinued        500 mg 100 mL/hr over 60 Minutes Intravenous Every 12 hours 05/18/24 1548 05/22/24 0959   05/18/24 1515  ciprofloxacin  (CIPRO ) IVPB 400 mg        400 mg 200 mL/hr over 60 Minutes Intravenous  Once 05/18/24 1501 05/18/24 1746   05/18/24 1515  metroNIDAZOLE  (FLAGYL ) IVPB 500 mg        500 mg 100 mL/hr over 60 Minutes Intravenous  Once 05/18/24 1501 05/18/24 1746       REVIEW OF SYSTEMS:  Const: negative fever, negative chills, negative weight loss Eyes: negative diplopia or visual changes, negative eye pain ENT: negative coryza, negative sore throat Resp: negative cough, hemoptysis, dyspnea Cards: negative for chest pain, palpitations, lower extremity edema GU: negative for frequency, dysuria and hematuria GI: As above  skin: negative for rash and pruritus Heme: negative for easy bruising and gum/nose bleeding MS:  weakness Neurolo:negative for headaches, dizziness, vertigo, memory problems  Psych: negative for feelings of anxiety, depression  Endocrine: Hypothyroidism Allergy/Immunology-PCN  allergy Objective:  VITALS:  BP (!) 127/54 (BP Location: Left Arm)   Pulse 79   Temp 98.3 F (36.8 C)   Resp 19   Ht 6' 1 (1.854 m)   Wt 97.3 kg   SpO2 100%   BMI 28.30 kg/m    PHYSICAL EXAM:  General: Alert, cooperative, no distress, appears stated age.  Head: Normocephalic, without obvious abnormality, atraumatic. Eyes: Conjunctivae clear, anicteric sclerae. Pupils are equal ENT Nares normal. No drainage or sinus tenderness. Lips, mucosa, and tongue normal. No Thrush Neck: Supple, symmetrical, no adenopathy, thyroid: non tender no carotid bruit and no JVD. Back: No CVA tenderness. Lungs: Clear to auscultation bilaterally. No Wheezing or Rhonchi. No rales. Heart: Regular rate and rhythm, no murmur, rub or gallop. Abdomen: Soft, right upper quadrant drain Extremities: Right lateral edema of the legs and ankle  skin: No rashes or lesions. Or bruising Lymph: Cervical, supraclavicular normal. Neurologic: Grossly non-focal Pertinent Labs Lab Results CBC    Component Value Date/Time   WBC 6.7 05/21/2024 1126   RBC 2.60 (L) 05/21/2024 1126   HGB 8.0 (L) 05/21/2024 1126   HCT 25.0 (L) 05/21/2024 1126   PLT 199 05/21/2024 1126   MCV 96.2 05/21/2024 1126   MCH 30.8 05/21/2024 1126   MCHC 32.0 05/21/2024 1126   RDW 13.7 05/21/2024 1126   LYMPHSABS 0.6 (L) 05/21/2024 1126   MONOABS 0.6 05/21/2024 1126   EOSABS 0.2 05/21/2024 1126   BASOSABS 0.0 05/21/2024 1126       Latest Ref Rng & Units 05/21/2024    1:20 PM 05/20/2024   10:28 AM 05/19/2024    6:45 AM  CMP  Glucose 70 - 99 mg/dL 898  884  90   BUN 8 - 23 mg/dL 46  33  25   Creatinine 0.61 - 1.24 mg/dL 8.77  8.70  8.93   Sodium 135 - 145 mmol/L 133  132  134   Potassium 3.5 - 5.1 mmol/L 3.7  3.6  4.0   Chloride 98 - 111 mmol/L 97  97  97   CO2 22 - 32 mmol/L 26  28  27    Calcium  8.9 - 10.3 mg/dL 8.3  8.1  8.1   Total Protein 6.5 - 8.1 g/dL   5.9   Total Bilirubin 0.0 - 1.2 mg/dL   2.3   Alkaline Phos 38 - 126 U/L   38   AST 15 - 41 U/L   16   ALT 0 - 44 U/L   13       Microbiology: Recent Results (from the past 240 hours)  Aerobic/Anaerobic Culture w Gram Stain (surgical/deep wound)      Status: None (Preliminary result)   Collection Time: 05/19/24  1:38 PM   Specimen: BILE  Result Value Ref Range Status   Specimen Description   Final    BILE Performed at [facility], [address] [place], [place] [address]    Special Requests   Final    Normal Performed at [facility], [address] [place], [place] [address]    Gram Stain   Final    NO WBC SEEN FEW YEAST WITH PSEUDOHYPHAE Performed at [facility], [address], [place], [place] [address]    Culture   Final    ABUNDANT CANDIDA ALBICANS NO ANAEROBES ISOLATED; CULTURE IN PROGRESS FOR 5 DAYS    Report Status PENDING  Incomplete    Lines and Device Date on insertion # of  days DC  Central line     Foley     ETT       IMAGING RESULTS: Moderate cholelithiasis with wall thickening and adjacent inflammatory change compatible with recent ultrasound findings of acute cholecystitis. 2. Mild ascites. 3. Slight nodular contour to liver unchanged which may be due to cirrhosis. Few small hypodensities over the liver unchanged likely cysts. 4. T12 compression fracture with slight interval progression. Minimal loss of vertebral body height of L1 described on prior lumbar spine report, although no images for direct correlation. 5. Aortic atherosclerosis. Atherosclerotic coronary artery disease I have personally reviewed the films ? Impression/Recommendation ?Acute cholelithiasis with cholecystitis Status post percutaneous cholecystostomy Culture of the bile is Candida albicans Patient is currently on Cipro  and Flagyl  for a total of 5 days.  The last dose will be tomorrow Will start fluconazole for 5 to 7 days.( After loading dose he can be on 400mg  every day dose) Will send a blood culture before starting fluconazole  PCN allergy  Patient has lost IV access and says he would like to further take p.o. medications   TAVR in 2020. No fever No blood cultures were sent  impression Will do 1 before starting treatment for Candida albicans  Chronic HFpEF Edema of the leg Nonobstructive CAD  Hypothyroidism on Synthroid   Hypertension  COPD  This consult involved complex antimicrobial management.? __I have personally spent  -60--minutes involved in face-to-face and non-face-to-face activities for this patient on the day of the visit. Professional time spent includes the following activities: Preparing to see the patient (review of tests), Obtaining and/or reviewing separately obtained history (admission/discharge record), Performing a medically appropriate examination and/or evaluation , Ordering medications/tests/procedures, referring and communicating with other health care professionals, Documenting clinical information in the EMR, Independently interpreting results (not separately reported), Communicating results to the patient/wife, Counseling and educating the patient/wife and Care coordination (not separately reported).  with hospitalist, nurse, ID pharmacist  ________________________________________________ Note:  This document was prepared using Dragon voice recognition software and may include unintentional dictation errors.

## 2024-05-22 NOTE — Progress Notes (Signed)
 Referring Provider(s): Arthea Platt, PA-C   Supervising Physician: Karalee Beat  Patient Status:  [facility] - In-pt  Chief Complaint: Acute Cholecystitis   Brief History:  Patient presented to the ED on 9/25 with complaints of RUQ for 3 days. ED workup revealed leukocytosis to 17.9 and imaging concerning for acute cholecystitis. Given his cardiac history and respiratory status, patient was deemed a poor surgical candidate and IR was consulted for and placed a percutaneous cholecystostomy tube on 9/26. MRCP without cholangitis, with acute cholecystitis complicated by contained perforation. Plan for cholecystectomy by Surgery 6-8 weeks after discharge. Pt to follow-up with Surgery service in 4 weeks after discharge.   Subjective:  Patient alert and laying in bed, calm. Wife is at bedside. Currently without any significant complaints. He is doing well, and feeling significantly improved since drain placement. Patient denies any fevers, headache, chest pain, SOB, cough, abdominal pain, nausea, vomiting or bleeding.    Allergies: Penicillins  Medications: Prior to Admission medications   Medication Sig Start Date End Date Taking? Authorizing Provider  Cholecalciferol  (VITAMIN D -1000 MAX ST) 25 MCG (1000 UT) tablet Take 1,000 Units by mouth daily.    Yes [provider]  furosemide  (LASIX ) 40 MG tablet Take 1 tablet (40 mg total) by mouth daily. May take an extra 1 tablet ( 40 MG ) daily as needed for leg swelling. 12/15/23 12/14/24 Yes Vivienne Lonni Ingle, NP  levothyroxine  (SYNTHROID , LEVOTHROID) 50 MCG tablet Take 50 mcg by mouth daily before breakfast.    Yes [provider]  losartan  (COZAAR ) 50 MG tablet Take 1 tablet (50 mg total) by mouth daily. 06/15/23  Yes Gollan, Timothy J, MD  metoprolol  succinate (TOPROL -XL) 25 MG 24 hr tablet Take 1 tablet (25 mg total) by mouth daily. 06/15/23  Yes Gollan, Timothy J, MD  mometasone -formoterol  (DULERA ) 200-5  MCG/ACT AERO Inhale 2 puffs into the lungs 2 (two) times daily. 11/29/23  Yes Gollan, Timothy J, MD  sodium chloride  flush 0.9 % SOLN injection 5 mLs by Intracatheter route as needed (Flush drain with 5 ccs daily). 05/22/24 07/03/24 Yes Schulz, Zachary R, PA-C  vitamin B-12 (CYANOCOBALAMIN ) 1000 MCG tablet Take 1,000 mcg by mouth daily.   Yes [provider]     Vital Signs: BP (!) 127/54 (BP Location: Left Arm)   Pulse 79   Temp 98.3 F (36.8 C)   Resp 19   Ht 6' 1 (1.854 m)   Wt 214 lb 8.1 oz (97.3 kg)   SpO2 100%   BMI 28.30 kg/m   Physical Exam Constitutional:      Appearance: Normal appearance.  Cardiovascular:     Rate and Rhythm: Normal rate.  Pulmonary:     Effort: Pulmonary effort is normal.  Abdominal:     Comments: RUQ drain appropriately dressed. Dressing is clean, dry, intact. Drain incision site non-tender, without evidence of infection. Retaining suture and Stat Lock in place.  Good biliary output in collection gravity bag.   Musculoskeletal:        General: Normal range of motion.  Skin:    General: Skin is warm and dry.  Neurological:     Mental Status: He is alert and oriented to person, place, and time.  Psychiatric:        Mood and Affect: Mood normal.        Behavior: Behavior normal.      Labs:  CBC: Recent Labs    05/18/24 1010 05/19/24 0645 05/20/24 1028 05/21/24 1126  WBC 17.9* 19.3* 12.3* 6.7  HGB 12.4* 11.8* 10.8* 8.0*  HCT 38.9* 36.0* 34.6* 25.0*  PLT 247 193 239 199    COAGS: Recent Labs    05/19/24 0916  INR 1.4*    BMP: Recent Labs    05/18/24 1010 05/19/24 0645 05/20/24 1028 05/21/24 1320  NA 133* 134* 132* 133*  K 4.0 4.0 3.6 3.7  CL 92* 97* 97* 97*  CO2 29 27 28 26   GLUCOSE 138* 90 115* 101*  BUN 25* 25* 33* 46*  CALCIUM  8.4* 8.1* 8.1* 8.3*  CREATININE 1.18 1.06 1.29* 1.22  GFRNONAA >60 >60 56* >60    LIVER FUNCTION TESTS: Recent Labs    05/18/24 1010 05/19/24 0645  BILITOT 2.2* 2.3*  AST  20 16  ALT 9 13  ALKPHOS 44 38  PROT 6.2* 5.9*  ALBUMIN 3.3* 2.9*    Assessment and Plan:  Drain Location: RUQ Size: Fr size: 10 Fr Date of placement: 05/19/2024  Currently to: Drain collection device: gravity 24 hour output:  Output by Drain (mL) 05/20/24 0701 - 05/20/24 1900 05/20/24 1901 - 05/21/24 0700 05/21/24 0701 - 05/21/24 1900 05/21/24 1901 - 05/22/24 0700 05/22/24 0701 - 05/22/24 1504  Biliary Tube Cholecystostomy 10 Fr. RUQ  50 25 20     Interval imaging/drain manipulation:  None.  Current examination: Flushes/aspirates easily.  Insertion site unremarkable. Suture and stat lock in place. Dressed appropriately.   Plan: Continue TID flushes with 5 cc NS. Record output Q shift. Dressing changes QD or PRN if soiled.  Call IR APP or on call IR MD if difficulty flushing or sudden change in drain output.   Discharge planning: Outpatient orders are in place. However, please contact IR APP or on call IR MD prior to patient d/c to ensure follow up plans are in place according to discharge plan at that time.  Typically patient will follow up with IR in 6-8 weeks post d/c for drain exchange. IR scheduler will contact patient with date/time of appointment.  Patient will need to flush drain QD with 5 cc NS, record output QD, dressing changes every 2-3 days or earlier if soiled.   IR will continue to follow - please call with questions or concerns.    Thank you for this interesting consult.  I greatly enjoyed meeting AMARIAN BOTERO and look forward to participating in their care.   Electronically Signed: Carlin DELENA Griffon, PA-C 05/22/2024, 3:04 PM     I spent a total of 15 Minutes at the the patient's bedside AND on the patient's hospital floor or unit, greater than 50% of which was counseling/coordinating care for RUQ drain care.

## 2024-05-22 NOTE — Progress Notes (Signed)
 Progress Note   Patient: Kenneth Larsen FMW:[id number] DOB: 1946-06-11 DOA: 05/18/2024     4 DOS: the patient was seen and examined on 05/22/2024   Brief hospital course:  Kenneth Larsen is a 78 y.o. male with medical history significant of hypertension, hypothyroidism, severe aortic stenosis status post TAVR, chronic diastolic CHF, obesity, COPD, chronic respiratory failure with hypoxia respiratory failure on 2-3L presenting with abdominal pain.   Patient reports 3 days of right upper quadrant pain.   Patient denies fevers, chills, chest pain, shortness of breath, constipation, diarrhea, nausea, vomiting.   ED Course: Vital signs in ED stable.   Lab workup notable for CMP with sodium 133, chloride 92, BUN 25, glucose 138, calcium  8.4, protein 6.2, albumin 3.3, T. bili 2.2.  CBC with leukocytosis to 17.9, hemoglobin stable at 12.4.  Lipase normal.  Urinalysis with protein and rare bacteria only.   Right upper quadrant ultrasound showed gallstones and changes consistent with acute cholecystitis.  Nonspecific liver changes also noted that could be consistent with cirrhosis.   Patient sees ciprofloxacin , Flagyl  in the ED.  Also received morphine , 500 cc IV fluid, Zofran .   General surgery consulted and are following.    09/26 MRCP plan for today.  IR consult for cholecystostomy tube placement   09/27 -Afebrile.  Tolerating his diet.  PT evaluation   09/28 -feels better.  Requesting for his diet to be advanced   09/29 -leukocytosis has normalized.  Afebrile.  Tolerating his diet     Assessment and Plan:   Acute cholecystitis Patient presented with 3 days of right upper quadrant abdominal pain.   Ultrasound showed changes consistent with gallstones and acute cholecystitis. Leukocytosis to 17.9 >> 19.3 >> 12.3 >> 6.7.  Afebrile MRCP shows findings of acute cholecystitis complicated by contained perforation. Intrinsic T1 hyperintensity along the gallbladder wall and surrounding the  perforation may represent hemorrhage. Mild intrahepatic bile duct dilation to the level of the hepatic hilum, where there are extensive inflammatory changes related to the cholecystitis. No extrahepatic bile duct dilation. No substantial peribiliary enhancement to suggest cholangitis. Small volume ascites. Appreciate surgery and IR input.  Patient is status post cholecystostomy tube insertion (POD 3) Continue empiric antibiotic therapy with ciprofloxacin  and Flagyl  Surgical culture yields abundant Candida albicans.  No anaerobes. Will consult ID for further recommendations. Discussed with surgery , cholecystostomy drain will remain in place for 6-8 weeks before interval cholecystectomy.  Patient to follow-up with surgery in 4 weeks. Cholecystostomy tube care has been discussed with patient and his wife at the bedside by the surgery PA     Hypertension - Continue  losartan  and metoprolol     Hypothyroidism - Continue Synthroid    Severe aortic stenosis - Status post TAVR   Chronic diastolic CHF - Not acutely exacerbated - Continue  losartan , metoprolol  -Hold Lasix  due to AKI   Obesity - Complicates overall prognosis and care -Lifestyle modification and exercise has been discussed with patient   OSA - BiPAP qhs   COPD Chronic respiratory with hypoxia - On chronic 2-3L at home. - Continue as needed bronchodilator therapy as well as inhaled steroids     AKI Most likely secondary to poor oral intake Patient noted to have a bump in his creatinine from 1.06 >> 1.29 >> 1.22 Hold furosemide  Monitor renal function closely    Anemia Most likely related to acute illness versus hemodilutional Noted to have a drop in his hemoglobin 12.4 >> 8.0 No evidence of acute blood loss Check  iron studies     Subjective: No new complaints.  Feels better.  Requesting to be discharged  Physical Exam: Vitals:   05/21/24 2042 05/22/24 0344 05/22/24 0500 05/22/24 0807  BP: 124/69 (!) 127/46   (!) 127/54  Pulse: 74 72  79  Resp: 16 16  19   Temp: 98 F (36.7 C) 97.8 F (36.6 C)  98.3 F (36.8 C)  TempSrc:      SpO2: 100% 100%  100%  Weight:   97.3 kg   Height:       General: He is not in acute distress.    Appearance: Normal appearance.  HENT:     Head: Normocephalic and atraumatic.     Mouth/Throat:     Mouth: Mucous membranes are moist.     Pharynx: Oropharynx is clear.  Eyes:     Extraocular Movements: Extraocular movements intact.     Pupils: Pupils are equal, round, and reactive to light.  Cardiovascular:     Rate and Rhythm: Normal rate and regular rhythm.     Pulses: Normal pulses.     Heart sounds: Normal heart sounds.  Pulmonary:     Effort: Pulmonary effort is normal. No respiratory distress.     Breath sounds: Normal breath sounds.  Abdominal:     General: Bowel sounds are normal. There is no distension.     Palpations: Abdomen is soft.  Cholecystostomy tube in right upper quadrant    Tenderness: Improved abdominal tenderness.  Musculoskeletal:        General: No swelling or deformity.  Skin:    General: Skin is warm and dry.  Neurological:     General: No focal deficit present.     Mental Status: Mental status is at baseline.       Data Reviewed: Magnesium  2.3, sodium 133, potassium 3.7, chloride 97, bicarb 26, glucose 101, BUN 46, creatinine 1.2, calcium  8.3, hemoglobin 8.0, white count 6.7, platelet count 199 Labs reviewed  Family Communication: Plan of care discussed with patient and his wife at the bedside.  They verbalized understanding and agree with the plan.  Disposition: Status is: Inpatient Remains inpatient appropriate because: Awaiting ID consult for further recommendation  Planned Discharge Destination: Home with Home Health    Time spent: 40 minutes  Author: Aimee Somerset, MD 05/22/2024 11:25 AM  For on call review [email or web address].

## 2024-05-22 NOTE — TOC CM/SW Note (Signed)
 Transition of Care [facility]) - Inpatient Brief Assessment   Patient Details  Name: Kenneth Larsen MRN: [id number] Date of Birth: [birth year]  [facility]) CM/SW Contact:    [name], LCSW Phone Number: 05/22/2024, 3:11 PM   Clinical Narrative:  Kenneth Larsen Rummer completed TOC chart review No further TOC needs. Please contact should any TOC needs arise.   Transition of Care Asessment:

## 2024-05-22 NOTE — Progress Notes (Signed)
 [facility] SURGICAL PROGRESS NOTE (cpt [phone])  Hospital Day(s): 4.   Interval History: Patient seen and examined, no acute events or new complaints overnight. Patient reports he feels much better. No fever, chills, nausea, emesis. No new labs this AM. Cholecystostomy tube with 45 ccs out; bilious expectedly. Cx with yeast. He is on Cipro /Flagyl . He is on regular diet.   Review of Systems:  Constitutional: denies fever, chills  HEENT: denies cough or congestion  Respiratory: denies any shortness of breath  Cardiovascular: denies chest pain or palpitations  Gastrointestinal: denied abdominal pain, denies N/V Genitourinary: denies burning with urination or urinary frequency Musculoskeletal: denies pain, decreased motor or sensation  Vital signs in last 24 hours: [min-max] current  Temp:  [97.8 F (36.6 C)-99 F (37.2 C)] 97.8 F (36.6 C) (09/29 0344) Pulse Rate:  [67-90] 72 (09/29 0344) Resp:  [16-19] 16 (09/29 0344) BP: (113-129)/(46-69) 127/46 (09/29 0344) SpO2:  [98 %-100 %] 100 % (09/29 0344) Weight:  [97.3 kg] 97.3 kg (09/29 0500)     Height: 6' 1 (185.4 cm) Weight: 97.3 kg BMI (Calculated): 28.31   Intake/Output last 2 shifts:  09/28 0701 - 09/29 0700 In: 480 [P.O.:480] Out: 45 [Drains:45]   Physical Exam:  Constitutional: alert, cooperative and no distress  HENT: normocephalic without obvious abnormality  Eyes: PERRL, EOM's grossly intact and symmetric  Respiratory: breathing non-labored at rest, on [place] Cardiovascular: regular rate and sinus rhythm  Gastrointestinal: soft, non-tender, non-distended, no rebound/guarding. Cholecystostomy tube in RUQ: bilious   Labs:     Latest Ref Rng & Units 05/21/2024   11:26 AM 05/20/2024   10:28 AM 05/19/2024    6:45 AM  CBC  WBC 4.0 - 10.5 K/uL 6.7  12.3  19.3   Hemoglobin 13.0 - 17.0 g/dL 8.0  89.1  88.1   Hematocrit 39.0 - 52.0 % 25.0  34.6  36.0   Platelets 150 - 400 K/uL 199  239  193       Latest Ref  Rng & Units 05/21/2024    1:20 PM 05/20/2024   10:28 AM 05/19/2024    6:45 AM  CMP  Glucose 70 - 99 mg/dL 898  884  90   BUN 8 - 23 mg/dL 46  33  25   Creatinine 0.61 - 1.24 mg/dL 8.77  8.70  8.93   Sodium 135 - 145 mmol/L 133  132  134   Potassium 3.5 - 5.1 mmol/L 3.7  3.6  4.0   Chloride 98 - 111 mmol/L 97  97  97   CO2 22 - 32 mmol/L 26  28  27    Calcium  8.9 - 10.3 mg/dL 8.3  8.1  8.1   Total Protein 6.5 - 8.1 g/dL   5.9   Total Bilirubin 0.0 - 1.2 mg/dL   2.3   Alkaline Phos 38 - 126 U/L   38   AST 15 - 41 U/L   16   ALT 0 - 44 U/L   13      Imaging studies: No new pertinent imaging studies   Assessment/Plan: 78 y.o. male with acute cholecystitis s/p percutaneous cholecystostomy tube placement, complicated by pertinent comorbidities including COPD on baseline O2, CHF, CAD, history of TAVR.    - Appreciate IR regarding percutaneous cholecystotomy tube placement.  - Continue IV Abx (Cipro /Flagyl ), may transition to p.o. antibiotics along with his tolerance of diet and anticipate discharge soon. ? Need for antifungal given Cx results.              -  leukocytosis, now resolved and normal.             - Monitor abdominal examination; on-going bowel function   - Further management per primary service   - Okay for discharge from surgical perspective. We will follow up in ~4 weeks. I will leave Rx for drain flushes.   All of the above findings and recommendations were discussed with the patient, patient's family (wife at bedside), and the medical team, and all of patient's and family's questions were answered to his/her/their expressed satisfaction.  -- Arthea Platt, PA-C [facility] 05/22/2024, 7:36 AM

## 2024-05-22 NOTE — Discharge Instructions (Signed)
 In addition to included general instructions,  Diet: Resume home diet.   Wound care: If you can keep drain site covered, you may take a short shower. Do not submerge drain site.   Drain: Monitor and record drain output daily. Outputs may vary day to day. I will leave a handout for this. Please flush drain daily (or every other day) with 5 ccs of normal saline.   Call office [phone]) at any time if any questions, worsening pain, fevers/chills, bleeding, or other concerns.

## 2024-05-23 ENCOUNTER — Other Ambulatory Visit (HOSPITAL_COMMUNITY): Payer: Self-pay

## 2024-05-23 ENCOUNTER — Other Ambulatory Visit: Payer: Self-pay

## 2024-05-23 MED ORDER — FLUCONAZOLE 200 MG PO TABS
400.0000 mg | ORAL_TABLET | Freq: Every day | ORAL | 0 refills | Status: AC
  Filled 2024-05-23: qty 10, 5d supply, fill #0

## 2024-05-23 MED ORDER — ATORVASTATIN CALCIUM 40 MG PO TABS
40.0000 mg | ORAL_TABLET | Freq: Every day | ORAL | 3 refills | Status: DC
  Filled 2024-05-23: qty 90, 90d supply, fill #0

## 2024-05-23 NOTE — TOC Transition Note (Signed)
 Transition of Care [facility]) - Discharge Note   Patient Details  Name: Kenneth Larsen MRN: [id number] Date of Birth: 19-Dec-1945  Transition of Care [facility]) CM/SW Contact:  [name], LCSW Phone Number: 05/23/2024, 3:13 PM   Clinical Narrative:    KEN DELENA Rummer completed [facility] home health. Services will begin 05/24/24. No further TOC needs. TOC signing off.    Final next level of care: Home w Home Health Services Barriers to Discharge: Barriers Resolved   Patient Goals and CMS Choice     Choice offered to / list presented to : Patient      Discharge Placement                       Discharge Plan and Services Additional resources added to the After Visit Summary for                            [facility] Arranged: RN [facility] Agency: [employer] [facility] Date [facility] Agency Contacted: 05/23/24   Representative spoke with at [facility] Agency: Channing rose with [employer]  Social Drivers of Health (SDOH) Interventions SDOH Screenings   Food Insecurity: No Food Insecurity (05/18/2024)  Housing: Low Risk  (05/18/2024)  Transportation Needs: No Transportation Needs (05/18/2024)  Utilities: Not At Risk (05/18/2024)  Social Connections: Unknown (05/18/2024)  Tobacco Use: Medium Risk (05/18/2024)     Readmission Risk Interventions     No data to display

## 2024-05-23 NOTE — Progress Notes (Signed)
 Mobility Specialist Progress Note:    05/23/24 1026  Mobility  Activity Ambulated with assistance  Level of Assistance Modified independent, requires aide device or extra time  Assistive Device Front wheel walker  Distance Ambulated (ft) 200 ft  Range of Motion/Exercises Active;All extremities  Activity Response Tolerated well  Mobility visit 1 Mobility  Mobility Specialist Start Time (ACUTE ONLY) 1015  Mobility Specialist Stop Time (ACUTE ONLY) 1026  Mobility Specialist Time Calculation (min) (ACUTE ONLY) 11 min   Pt tolerated mobility session well, asx throughout. Returned to chair, all needs met.  Sherrilee Ditty Mobility Specialist Please contact via Special educational needs teacher or  Rehab office at [phone]

## 2024-05-23 NOTE — Discharge Summary (Signed)
 Physician Discharge Summary   Patient: Kenneth Larsen MRN: [id number] DOB: Jun 30, 1946  Admit date:     05/18/2024  Discharge date: 05/23/24  Discharge Physician: Leita Blanch   PCP: [facility]   Recommendations at discharge:   follow-up general surgery in 1 to 2 week follow-up PCP in 1 to 2 weeks follow-up recommendations by interventional radiology and gallbladder drain care for nursing education  Discharge Diagnoses: Principal Problem:   Acute cholecystitis Active Problems:   COPD (chronic obstructive pulmonary disease) (HCC)   Chronic diastolic CHF (congestive heart failure) (HCC)   Hypertension   Hypothyroidism   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 male with medical history significant of hypertension, hypothyroidism, severe aortic stenosis status post TAVR, chronic diastolic CHF, obesity, COPD, chronic respiratory failure with hypoxia respiratory failure on 2-3L presenting with abdominal pain.   Right upper quadrant ultrasound showed gallstones and changes consistent with acute cholecystitis. Nonspecific liver changes also noted that could be consistent with cirrhosis.   Acute cholecystitis --Patient presented with 3 days of right upper quadrant abdominal pain.   --Ultrasound showed changes consistent with gallstones and acute cholecystitis. --Leukocytosis to 17.9 >> 19.3 >> 12.3 >> 6.7.  Afebrile --MRCP shows findings of acute cholecystitis complicated by contained perforation. Intrinsic T1 hyperintensity along the gallbladder wall and surrounding the perforation may represent hemorrhage. Mild intrahepatic bile duct dilation to the level of the hepatic hilum, where there are extensive inflammatory changes related to the cholecystitis. No extrahepatic bile duct dilation. No substantial peribiliary  enhancement to suggest cholangitis. Small volume ascites. --Appreciate surgery and IR input.  Patient is status post cholecystostomy tube insertion (POD 4) --Completed ciprofloxacin  and Flagyl  --Surgical culture yields abundant Candida albicans.  No anaerobes. -- Infectious disease Dr. Searcy recommends complete fluconazole. Patient will need five more days as outpatient --Discussed with surgery , cholecystostomy drain will remain in place for 6-8 weeks before interval cholecystectomy.  Patient to follow-up with surgery in 4 weeks. --Cholecystostomy tube care has been discussed with patient and his wife at the bedside by the surgery PA   Hypertension - Continue  losartan  and metoprolol      Hypothyroidism - Continue Synthroid    Severe aortic stenosis - Status post TAVR   Chronic diastolic CHF - Not acutely exacerbated - Continue  losartan , metoprolol  -Hold Lasix  due to AKI   Obesity - Complicates overall prognosis and care -Lifestyle modification and exercise has been discussed with patient   OSA - BiPAP qhs   COPD Chronic respiratory with hypoxia - On chronic 2-3L at home. - Continue as needed bronchodilator therapy as well as inhaled steroids   AKI --Most likely secondary to poor oral intake --Patient noted to have a bump in his creatinine from 1.06 >> 1.29 >> 1.22  Anemia Most likely related to acute illness versus hemodilutional Noted to have a drop in his hemoglobin 12.4 >> 8.0 No evidence of acute blood loss  Overall hemodynamically stable. Okay from ID and surgical standpoint discharge. Discharge plan discussed with patient and wife there in agreement.           Consultants: surgery, ID Procedures performed: GB drain by IR  Disposition: Home Diet recommendation:  Discharge Diet Orders (From admission, onward)     Start     Ordered   05/23/24 0000  Diet - low sodium  heart healthy        05/23/24 1345           Cardiac diet DISCHARGE  MEDICATION: Allergies as of 05/23/2024       Reactions   Penicillins Hives   Did it involve swelling of the face/tongue/throat, SOB, or low BP? No Did it involve sudden or severe rash/hives, skin peeling, or any reaction on the inside of your mouth or nose? No Did you need to seek medical attention at a hospital or doctor's office? No When did it last happen?      childhood allergy If all above answers are NO, may proceed with cephalosporin use.        Medication List     TAKE these medications    atorvastatin  40 MG tablet Commonly known as: LIPITOR Take 1 tablet (40 mg total) by mouth daily.   cyanocobalamin  1000 MCG tablet Commonly known as: VITAMIN B12 Take 1,000 mcg by mouth daily.   fluconazole 200 MG tablet Commonly known as: DIFLUCAN Take 2 tablets (400 mg total) by mouth daily for 5 days. Start taking on: May 24, 2024   furosemide  40 MG tablet Commonly known as: Lasix  Take 1 tablet (40 mg total) by mouth daily. May take an extra 1 tablet ( 40 MG ) daily as needed for leg swelling.   levothyroxine  50 MCG tablet Commonly known as: SYNTHROID  Take 50 mcg by mouth daily before breakfast.   losartan  50 MG tablet Commonly known as: COZAAR  Take 1 tablet (50 mg total) by mouth daily.   metoprolol  succinate 25 MG 24 hr tablet Commonly known as: TOPROL -XL Take 1 tablet (25 mg total) by mouth daily.   mometasone -formoterol  200-5 MCG/ACT Aero Commonly known as: DULERA  Inhale 2 puffs into the lungs 2 (two) times daily.   sodium chloride  flush 0.9 % Soln injection 5 mLs by Intracatheter route as needed (Flush drain with 5 ccs daily).   sodium chloride  flush 0.9 % Soln Commonly known as: NS 5-10 mLs by Intracatheter route daily.   Vitamin D -1000 Max St 25 MCG (1000 UT) tablet Generic drug: Cholecalciferol  Take 1,000 Units by mouth daily.               Discharge Care Instructions  (From admission, onward)           Start     Ordered   05/23/24  0000  Discharge wound care:       Comments: Cholecystostomy drain care per RN education   05/23/24 1345            Follow-up Information     Marinda Jayson KIDD, MD. Go on 06/20/2024.   Specialty: General Surgery Why: Follow up in 4 weeks; cholecystitis with cholecystostomy tube. Go at 9:30am. Contact information: [address] [place] [address] [phone]         [facility] Follow up on 05/23/2024.   Specialty: General Practice Why: The office will call you for your follow up. Contact information: [address] [place] [address] [phone]                Discharge Exam: Filed Weights   05/20/24 0331 05/21/24 0545 05/22/24 0500  Weight: 95.1 kg 94.8 kg 97.3 kg   Gallbladder drain right upper quadrant. Skin looks normal. No abdominal tenderness  Condition at discharge: fair  The results of significant diagnostics from this hospitalization (including imaging, microbiology, ancillary and laboratory) are listed below for reference.   Imaging  Studies: IR Perc Cholecystostomy Result Date: 05/19/2024 INDICATION: 78 year old male with acute calculus cholecystitis. He is a poor operative candidate at the current time and therefore presents for percutaneous cholecystostomy tube placement. EXAM: CHOLECYSTOSTOMY MEDICATIONS: In patient currently receiving intravenous ciprofloxacin  and Flagyl . No additional antibiotic prophylaxis was administered. ANESTHESIA/SEDATION: Moderate (conscious) sedation was employed during this procedure. A total of Versed  1 mg and Fentanyl  100 mcg was administered intravenously by the Radiology nurse. Moderate Sedation Time: 10 minutes. The patient's level of consciousness and vital signs were monitored continuously by radiology nursing throughout the procedure under my direct supervision. FLUOROSCOPY TIME:  Radiation exposure index: 12 mGy, air kerma COMPLICATIONS: None immediate. PROCEDURE: Informed  written consent was obtained from the patient after a thorough discussion of the procedural risks, benefits and alternatives. All questions were addressed. Maximal Sterile Barrier Technique was utilized including caps, mask, sterile gowns, sterile gloves, sterile drape, hand hygiene and skin antiseptic. A timeout was performed prior to the initiation of the procedure. The right upper quadrant was interrogated with ultrasound. The dilated and thick-walled gallbladder is successfully visualized. No good transhepatic course could be identified sonographically. Therefore, the decision was made to proceed with trans peritoneal tube placement. A suitable skin entry site was selected and marked. Local anesthesia was attained by infiltration with 1% lidocaine . A small dermatotomy was made. Under real-time ultrasound guidance, a 21 gauge Accustick needle was carefully advanced through the abdominal wall and into the gallbladder lumen via the fundus. A wire was then coiled in the gallbladder lumen. The Accustick needle was exchanged for the transitional dilator which was advanced over the wire. Aspiration was performed yielding thick black bile. A sample was sent for Gram stain and culture. A 0.035 wire was then coiled in the gallbladder lumen. The transitional dilator was removed. The tract was dilated to 10 [place] and then a 10 [place] skater drainage catheter was advanced over the wire and formed. Images were obtained and stored for the medical record confirming placement. The catheter was connected to gravity bag drainage and secured to the skin with 0 Prolene suture. Bandages were then applied. IMPRESSION: Successful placement of a transperitoneal 10 French percutaneous cholecystostomy tube for an indication of acute calculus cholecystitis. Electronically Signed   By: Wilkie Lent M.D.   On: 05/19/2024 14:48   MR ABDOMEN MRCP W WO CONTAST Result Date: 05/19/2024 CLINICAL DATA:  Cholecystitis EXAM: MRI ABDOMEN  WITHOUT AND WITH CONTRAST (INCLUDING MRCP) TECHNIQUE: Multiplanar multisequence MR imaging of the abdomen was performed both before and after the administration of intravenous contrast. Heavily T2-weighted images of the biliary and pancreatic ducts were obtained. Post-processing was applied at the acquisition scanner with concurrent physician supervision which includes 3D reconstructions, MIPs, volume rendered images and/or shaded surface rendering. CONTRAST:  10mL GADAVIST  GADOBUTROL  1 MMOL/ML IV SOLN COMPARISON:  Same day CT abdomen and pelvis, ultrasound abdomen dated 05/18/2024 FINDINGS: Lower chest: No acute findings. Hepatobiliary: Scattered hepatic cysts measuring up to 1.1 cm in peripheral anterior segment 4 (5:11). Mild intrahepatic bile duct dilation to the level of the hepatic hilum, where there are extensive inflammatory changes. No extrahepatic bile duct dilation. Distended gallbladder demonstrates cholelithiasis an circumferential mural thickening. Along the posteromedial aspect of the gallbladder, there is an irregular loculated fluid collection measuring 3.7 x 0.8 cm (4:23) adjacent to an area of mural discontinuity (4:22-24, 5:24,25). There is intrinsic T1 hyperintensity along the gallbladder wall and surrounding this fluid collection. Pancreas: No mass, inflammatory changes, or other parenchymal abnormality identified. Spleen:  Within normal limits in size and appearance. Adrenals/Urinary Tract: No adrenal nodules. No suspicious renal masses identified. No evidence of hydronephrosis. Stomach/Bowel: Visualized portions within the abdomen are unremarkable. Normal appendix. Vascular/Lymphatic: No pathologically enlarged lymph nodes identified. No abdominal aortic aneurysm demonstrated. Other:  Small volume ascites. Musculoskeletal: No suspicious bone lesions identified. IMPRESSION: 1. Findings of acute cholecystitis complicated by contained perforation. Intrinsic T1 hyperintensity along the  gallbladder wall and surrounding the perforation may represent hemorrhage. 2. Mild intrahepatic bile duct dilation to the level of the hepatic hilum, where there are extensive inflammatory changes related to the cholecystitis. No extrahepatic bile duct dilation. No substantial peribiliary enhancement to suggest cholangitis. 3. Small volume ascites. Electronically Signed   By: Limin  Xu M.D.   On: 05/19/2024 13:40   MR 3D Recon At Scanner Result Date: 05/19/2024 CLINICAL DATA:  Cholecystitis EXAM: MRI ABDOMEN WITHOUT AND WITH CONTRAST (INCLUDING MRCP) TECHNIQUE: Multiplanar multisequence MR imaging of the abdomen was performed both before and after the administration of intravenous contrast. Heavily T2-weighted images of the biliary and pancreatic ducts were obtained. Post-processing was applied at the acquisition scanner with concurrent physician supervision which includes 3D reconstructions, MIPs, volume rendered images and/or shaded surface rendering. CONTRAST:  10mL GADAVIST  GADOBUTROL  1 MMOL/ML IV SOLN COMPARISON:  Same day CT abdomen and pelvis, ultrasound abdomen dated 05/18/2024 FINDINGS: Lower chest: No acute findings. Hepatobiliary: Scattered hepatic cysts measuring up to 1.1 cm in peripheral anterior segment 4 (5:11). Mild intrahepatic bile duct dilation to the level of the hepatic hilum, where there are extensive inflammatory changes. No extrahepatic bile duct dilation. Distended gallbladder demonstrates cholelithiasis an circumferential mural thickening. Along the posteromedial aspect of the gallbladder, there is an irregular loculated fluid collection measuring 3.7 x 0.8 cm (4:23) adjacent to an area of mural discontinuity (4:22-24, 5:24,25). There is intrinsic T1 hyperintensity along the gallbladder wall and surrounding this fluid collection. Pancreas: No mass, inflammatory changes, or other parenchymal abnormality identified. Spleen:  Within normal limits in size and appearance. Adrenals/Urinary  Tract: No adrenal nodules. No suspicious renal masses identified. No evidence of hydronephrosis. Stomach/Bowel: Visualized portions within the abdomen are unremarkable. Normal appendix. Vascular/Lymphatic: No pathologically enlarged lymph nodes identified. No abdominal aortic aneurysm demonstrated. Other:  Small volume ascites. Musculoskeletal: No suspicious bone lesions identified. IMPRESSION: 1. Findings of acute cholecystitis complicated by contained perforation. Intrinsic T1 hyperintensity along the gallbladder wall and surrounding the perforation may represent hemorrhage. 2. Mild intrahepatic bile duct dilation to the level of the hepatic hilum, where there are extensive inflammatory changes related to the cholecystitis. No extrahepatic bile duct dilation. No substantial peribiliary enhancement to suggest cholangitis. 3. Small volume ascites. Electronically Signed   By: Limin  Xu M.D.   On: 05/19/2024 13:40   CT ABDOMEN PELVIS W CONTRAST Result Date: 05/19/2024 CLINICAL DATA:  Cholecystitis.  Awaiting percutaneous drain. EXAM: CT ABDOMEN AND PELVIS WITH CONTRAST TECHNIQUE: Multidetector CT imaging of the abdomen and pelvis was performed using the standard protocol following bolus administration of intravenous contrast. RADIATION DOSE REDUCTION: This exam was performed according to the departmental dose-optimization program which includes automated exposure control, adjustment of the mA and/or kV according to patient size and/or use of iterative reconstruction technique. CONTRAST:  OMNIPAQUE  IOHEXOL  300 MG/ML  SOLN COMPARISON:  Right upper quadrant ultrasound 05/18/2024, chest CT 05/06/2022, 11/15/2019 FINDINGS: Lower chest: Heart is normal size. Calcified plaque over the 3 vessel coronary arteries. Evidence of previous TAVR. Calcified plaque over the descending thoracic aorta. Moderate elevation of the left hemidiaphragm.  Mild bibasilar atelectatic change. Hepatobiliary: Few small hypodensities over  the liver unchanged likely cysts. Slight nodular contour to liver unchanged which may be due to cirrhosis. Moderate cholelithiasis with wall thickening and adjacent inflammatory change compatible recent ultrasound findings of acute cholecystitis. Biliary tree is otherwise unremarkable. Pancreas: Normal. Spleen: Normal. Adrenals/Urinary Tract: Adrenal glands are normal. Kidneys are normal in size and demonstrate no hydronephrosis or nephrolithiasis. Ureters and bladder are normal. Stomach/Bowel: Stomach and small bowel are normal. Appendix is normal. Colon is unremarkable. Vascular/Lymphatic: Calcified plaque over the abdominal aorta which is normal caliber. Remaining vascular structures are unremarkable. No significant adenopathy. Reproductive: Prostate is unremarkable. Other: Minimal patchy free fluid over the mid abdomen/mesentery as well as right pericolic gutter and mild-to-moderate free fluid over the pelvis. Musculoskeletal: T12 compression fracture with slight interval progression. Minimal loss of vertebral body height of L1 described on prior lumbar spine series 2016 although no images for direct correlation. IMPRESSION: 1. Moderate cholelithiasis with wall thickening and adjacent inflammatory change compatible with recent ultrasound findings of acute cholecystitis. 2. Mild ascites. 3. Slight nodular contour to liver unchanged which may be due to cirrhosis. Few small hypodensities over the liver unchanged likely cysts. 4. T12 compression fracture with slight interval progression. Minimal loss of vertebral body height of L1 described on prior lumbar spine report, although no images for direct correlation. 5. Aortic atherosclerosis. Atherosclerotic coronary artery disease. Aortic Atherosclerosis (ICD10-I70.0). Electronically Signed   By: Toribio Agreste M.D.   On: 05/19/2024 09:29   US  Abdomen Limited RUQ (LIVER/GB) Result Date: 05/18/2024 CLINICAL DATA:  Upper abdominal pain. EXAM: ULTRASOUND ABDOMEN LIMITED  RIGHT UPPER QUADRANT COMPARISON:  Chest CTA dated 05/06/2022 FINDINGS: Gallbladder: Multiple gallstones layering in the dependent portion of the gallbladder measuring up to 8 mm in maximum diameter each. Gallbladder wall thickening with a maximum thickness of 4.9 mm. Small amount of pericholecystic fluid. Positive sonographic Murphy sign. Common bile duct: Diameter: 2.5 mm Liver: Diffusely echogenic and heterogeneous with lobulated contours. Portal vein is patent on color Doppler imaging with normal direction of blood flow towards the liver. Other: None. IMPRESSION: 1. Cholelithiasis with gallbladder wall thickening, pericholecystic fluid and positive sonographic Murphy sign compatible with acute cholecystitis. 2. Diffusely echogenic and heterogeneous liver with lobulated contours. This is nonspecific but can be seen with cirrhosis. Electronically Signed   By: Elspeth Bathe M.D.   On: 05/18/2024 13:57    Microbiology: Results for orders placed or performed during the hospital encounter of 05/18/24  Aerobic/Anaerobic Culture w Gram Stain (surgical/deep wound)     Status: None (Preliminary result)   Collection Time: 05/19/24  1:38 PM   Specimen: BILE  Result Value Ref Range Status   Specimen Description   Final    BILE Performed at [facility], [address] [place], [place] [address]    Special Requests   Final    Normal Performed at [facility], [address] [place], [place] [address]    Gram Stain   Final    NO WBC SEEN FEW YEAST WITH PSEUDOHYPHAE Performed at [facility], [address] [place], [place] [address]    Culture   Final    ABUNDANT CANDIDA ALBICANS NO ANAEROBES ISOLATED; CULTURE IN PROGRESS FOR 5 DAYS    Report Status PENDING  Incomplete  Culture, blood (Routine X 2) w Reflex to ID Panel     Status: None (Preliminary result)   Collection Time: 05/22/24  2:05 PM   Specimen: BLOOD  Result Value Ref Range Status   Specimen Description BLOOD  LEFT AC  Final   Special Requests   Final    BOTTLES DRAWN AEROBIC ONLY Blood Culture results may not be optimal due to an inadequate volume of blood received in culture bottles   Culture   Final    NO GROWTH < 24 HOURS Performed at [facility], [address] [place], [place] [address]    Report Status PENDING  Incomplete  Culture, blood (Routine X 2) w Reflex to ID Panel     Status: None (Preliminary result)   Collection Time: 05/22/24  2:05 PM   Specimen: BLOOD RIGHT HAND  Result Value Ref Range Status   Specimen Description BLOOD RIGHT HAND  Final   Special Requests   Final    BOTTLES DRAWN AEROBIC ONLY Blood Culture results may not be optimal due to an inadequate volume of blood received in culture bottles   Culture   Final    NO GROWTH < 24 HOURS Performed at [facility], [address] [place], [place] [address]    Report Status PENDING  Incomplete    Labs: CBC: Recent Labs  Lab 05/18/24 1010 05/19/24 0645 05/20/24 1028 05/21/24 1126  WBC 17.9* 19.3* 12.3* 6.7  NEUTROABS  --   --  10.5* 5.3  HGB 12.4* 11.8* 10.8* 8.0*  HCT 38.9* 36.0* 34.6* 25.0*  MCV 94.0 92.1 94.5 96.2  PLT 247 193 239 199   Basic Metabolic Panel: Recent Labs  Lab 05/18/24 1010 05/19/24 0645 05/20/24 1028 05/21/24 1320  NA 133* 134* 132* 133*  K 4.0 4.0 3.6 3.7  CL 92* 97* 97* 97*  CO2 29 27 28 26   GLUCOSE 138* 90 115* 101*  BUN 25* 25* 33* 46*  CREATININE 1.18 1.06 1.29* 1.22  CALCIUM  8.4* 8.1* 8.1* 8.3*  MG  --   --   --  2.3   Liver Function Tests: Recent Labs  Lab 05/18/24 1010 05/19/24 0645  AST 20 16  ALT 9 13  ALKPHOS 44 38  BILITOT 2.2* 2.3*  PROT 6.2* 5.9*  ALBUMIN 3.3* 2.9*    Discharge time spent: greater than 30 minutes.  Signed: Leita Blanch, MD [facility] 05/23/2024

## 2024-05-23 NOTE — Plan of Care (Signed)

## 2024-05-24 ENCOUNTER — Other Ambulatory Visit: Payer: Self-pay

## 2024-05-24 LAB — AEROBIC/ANAEROBIC CULTURE W GRAM STAIN (SURGICAL/DEEP WOUND)
Gram Stain: NONE SEEN
Special Requests: NORMAL

## 2024-05-26 ENCOUNTER — Ambulatory Visit: Admitting: Physician Assistant

## 2024-05-26 ENCOUNTER — Encounter: Payer: Self-pay | Admitting: Physician Assistant

## 2024-05-26 VITALS — BP 107/60 | HR 77 | Ht 73.0 in | Wt 211.0 lb

## 2024-05-26 DIAGNOSIS — K810 Acute cholecystitis: Secondary | ICD-10-CM | POA: Diagnosis not present

## 2024-05-26 NOTE — Progress Notes (Signed)
 [facility] SURGICAL CLINIC NOTE  05/26/2024  History of Present Illness: Kenneth Larsen is a 78 y.o. male known to our service follow recent admission for cholecystitis s/p percutaneous cholecystostomy tube placement on 09/26. He was discharged home on Fluconazole. Since being home, he reports that he has done well. However, reportedly his [facility] RN removed his cholecystostomy tube dressing and wife was concerned about this. She noted some mild redness around the tube itself. No reported fever, chills, nausea, emesis. Patient reports his pain is markedly improved to the point of near resolution. Cholecystostomy tube with about 100 - 150 ccs daily. Wife is flushing as instructed. No other acute changes.   Past Medical History: Past Medical History:  Diagnosis Date   Acute respiratory failure with hypoxia (HCC) 05/07/2022   Arthritis    CAD (coronary artery disease)    a. 09/2023 Cath: LM 15d, LAD 40p, LCX 43m, OM1 min irregs, RCA 40p, 30d.   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 04/2022 Echo: EF 55-60%, no rwma, Gri DD, nl RV fxn, RVSP 28.67mmHg. No AS/AI.     Past Surgical History: Past Surgical History:  Procedure Laterality Date   CARDIAC CATHETERIZATION     IR PERC CHOLECYSTOSTOMY  05/19/2024   RIGHT/LEFT HEART CATH AND CORONARY ANGIOGRAPHY N/A 10/13/2018   Procedure: RIGHT/LEFT HEART CATH AND CORONARY ANGIOGRAPHY;  Surgeon: Mady Bruckner, MD;  Location: [facility] INVASIVE CV LAB;  Service: Cardiovascular;  Laterality: N/A;   TEE WITHOUT CARDIOVERSION N/A 11/15/2018   Procedure: TRANSESOPHAGEAL ECHOCARDIOGRAM (TEE);  Surgeon: Verlin Bruckner BIRCH, MD;   Location: [facility] INVASIVE CV LAB;  Service: Open Heart Surgery;  Laterality: N/A;   TRANSCATHETER AORTIC VALVE REPLACEMENT, TRANSFEMORAL  11/15/2018   TRANSCATHETER AORTIC VALVE REPLACEMENT, TRANSFEMORAL N/A 11/15/2018   Procedure: TRANSCATHETER AORTIC VALVE REPLACEMENT, TRANSFEMORAL;  Surgeon: Verlin Bruckner BIRCH, MD;  Location: [facility] INVASIVE CV LAB;  Service: Open Heart Surgery;  Laterality: N/A;   VASECTOMY      Home Medications: Prior to Admission medications   Medication Sig Start Date End Date Taking? Authorizing Provider  atorvastatin  (LIPITOR) 40 MG tablet Take 1 tablet (40 mg total) by mouth daily. 05/23/24 08/21/24 Yes Tobie Calix, MD  Cholecalciferol  (VITAMIN D -1000 MAX ST) 25 MCG (1000 UT) tablet Take 1,000 Units by mouth daily.    Yes [provider]  fluconazole (DIFLUCAN) 200 MG tablet Take 2 tablets (400 mg total) by mouth daily for 5 days. 05/24/24 05/29/24 Yes Tobie Calix, MD  furosemide  (LASIX ) 40 MG tablet Take 1 tablet (40 mg total) by mouth daily. May take an extra 1 tablet ( 40 MG ) daily as needed for leg swelling. 12/15/23 12/14/24 Yes Vivienne Bruckner Ingle, NP  levothyroxine  (SYNTHROID , LEVOTHROID) 50 MCG tablet Take 50 mcg by mouth daily before breakfast.    Yes [provider]  losartan  (COZAAR ) 50 MG tablet Take 1 tablet (50 mg total) by mouth daily. 06/15/23  Yes Gollan, Timothy J, MD  metoprolol  succinate (TOPROL -XL) 25 MG 24 hr tablet Take 1 tablet (25 mg total) by mouth daily. 06/15/23  Yes Gollan, Timothy J, MD  mometasone -formoterol  (DULERA ) 200-5 MCG/ACT  AERO Inhale 2 puffs into the lungs 2 (two) times daily. 11/29/23  Yes Gollan, Timothy J, MD  sodium chloride  flush (NS) 0.9 % SOLN 5-10 mLs by Intracatheter route daily. 05/22/24 07/21/24 Yes Carim, Charles A, PA-C  sodium chloride  flush 0.9 % SOLN injection 5 mLs by Intracatheter route as needed (Flush drain with 5 ccs daily). 05/22/24 07/03/24 Yes Dandre Sisler R, PA-C  vitamin B-12  (CYANOCOBALAMIN ) 1000 MCG tablet Take 1,000 mcg by mouth daily.   Yes [provider]    Allergies: Allergies  Allergen Reactions   Penicillins Hives    Did it involve swelling of the face/tongue/throat, SOB, or low BP? No Did it involve sudden or severe rash/hives, skin peeling, or any reaction on the inside of your mouth or nose? No Did you need to seek medical attention at a hospital or doctor's office? No When did it last happen?      childhood allergy If all above answers are NO, may proceed with cephalosporin use.     Review of Systems: Review of Systems  Constitutional:  Negative for chills and fever.  Respiratory:  Negative for cough and shortness of breath.   Cardiovascular:  Negative for chest pain and palpitations.  Gastrointestinal:  Negative for abdominal pain, nausea and vomiting.       + Cholecystostomy Tube Issue   Genitourinary:  Negative for dysuria and urgency.  All other systems reviewed and are negative.   Physical Exam BP 107/60   Pulse 77   Ht 6' 1 (1.854 m)   Wt 211 lb (95.7 kg)   SpO2 98%   BMI 27.84 kg/m   Physical Exam Vitals and nursing note reviewed. Exam conducted with a chaperone present.  Constitutional:      General: He is not in acute distress.    Appearance: Normal appearance. He is obese. He is not ill-appearing.     Comments: Sitting up in bed; NAD  HENT:     Head: Normocephalic and atraumatic.  Eyes:     General: No scleral icterus.    Conjunctiva/sclera: Conjunctivae normal.  Cardiovascular:     Rate and Rhythm: Normal rate.     Pulses: Normal pulses.     Heart sounds: No murmur heard. Pulmonary:     Effort: Pulmonary effort is normal.     Breath sounds: Normal breath sounds.     Comments: On [place]; baseline  Abdominal:     General: Abdomen is flat. There is no distension.     Palpations: Abdomen is soft.     Tenderness: There is no abdominal tenderness. There is no guarding or rebound.     Comments: Abdomen is  soft, no significant tenderness, non-distended, no rebound/guarding.  Cholecystostomy tube in place in RUQ; bilious drainage, expected mild erythema to the surrounding skin consistent with irritation from the tube itself; Stat-Lock dressing had been removed.   Genitourinary:    Comments: Deferred Skin:    General: Skin is warm and dry.  Neurological:     General: No focal deficit present.     Mental Status: He is alert and oriented to person, place, and time.  Psychiatric:        Mood and Affect: Mood normal.        Behavior: Behavior normal.     Labs/Imaging: No new labs or imaging   Assessment and Plan: This is a 78 y.o. male with acute cholecystitis s/p percutaneous cholecystotomy tube    - New Stat-Lock dressing placed; encouraged to  maintain this and not remove  - Reviewed local drain care, dressing changes, record output daily  - Complete fluconazole as prescribed  - He can follow up as scheduled 10/28 for interval cholecystectomy consideration. He and his wife understand to call at any time in the interim with questions or concerns.   Face-to-face time spent with the patient and care providers was 20 minutes, with more than 50% of the time spent counseling, educating, and coordinating care of the patient.     Arthea Platt, PA-C [facility] 05/26/2024, 11:55 AM M-F: 7am - 4pm

## 2024-05-26 NOTE — Patient Instructions (Addendum)
 Do not remove the butterfly dressing(stat lock). You may keep a clean dry gauze over it for comfort and change this as needed.   Finish all of your antibiotics.   Follow-up with our office as scheduled.   Please call and ask to speak with a nurse if you develop questions or concerns.

## 2024-05-27 LAB — CULTURE, BLOOD (ROUTINE X 2)
Culture: NO GROWTH
Culture: NO GROWTH

## 2024-05-30 NOTE — Progress Notes (Signed)
 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 He is accompanied by his spouse, Mrs. Kenneth Larsen.  He was hospitalized for six days due to a gallbladder infection, during which a drainage tube was placed. He completed a course of antibiotics last Sunday. He is scheduled for gallbladder surgery in four to six weeks. There is concern about the timing of receiving a flu shot in relation to his recent illness and upcoming surgery.  He has a history of COPD and is currently using Dulera . He maintains his oxygen at a setting of three but reduces it to two when going out, and is able to breathe adequately despite these adjustments. He has been experiencing hoarseness since his hospital stay and notes some swelling in his ankles, which he attributes to fluid retention. His recent pulmonary function test showed an FEV1 of 29%, lung volumes at 77%, and DLCO at 60%.  His hemoglobin level is eight, and his iron levels are low. He has not previously seen a hematologist or oncologist for this condition.   Past Medical History:   Past Medical History:  Diagnosis Date  . Hypertension   . Hypothyroid   . Melanoma (CMS/HHS-HCC)   . Obesity     Past Surgical History:   Past Surgical History:  Procedure Laterality Date  . melanoma     removal   . VASECTOMY      Allergies:   Allergies  Allergen Reactions  . Penicillin Hives    Current Medications:   Prior to Admission medications  Medication Sig Taking? Last Dose  cholecalciferol  (VITAMIN D3) 1,000 unit tablet Take 1,000 Units by mouth once daily. Yes Taking  cyanocobalamin  (VITAMIN B12) 1000  MCG tablet Take 1,000 mcg by mouth once daily. Yes Taking  FUROsemide  (LASIX ) 40 MG tablet Take 40 mg by mouth once daily Yes Taking  levothyroxine  (SYNTHROID , LEVOTHROID) 50 MCG tablet Take 50 mcg by mouth once daily. Take on an empty stomach with a glass of water  at least 30-60 minutes before breakfast. Yes Taking  losartan  (COZAAR ) 50 MG tablet Take 50 mg by mouth once daily Yes Taking  metoprolol  succinate (TOPROL -XL) 25 MG XL tablet Take 25 mg by mouth once daily Yes Taking  mometasone -formoterol  (DULERA ) 200-5 mcg/actuation inhaler Inhale 2 inhalations into the lungs 2 (two) times daily Yes Taking  spironolactone (ALDACTONE) 25 MG tablet Take 1 tablet (25 mg total) by mouth once daily for 7 days    TORsemide (DEMADEX) 20 MG tablet Take 1 tablet (20 mg total) by mouth once daily for 7 days      Family History:   Family History  Problem Relation Name Age of Onset  . Throat cancer Father    . ALS Sister      Social History:   Social History   Socioeconomic History  . Marital status: Married  Tobacco Use  . Smoking status: Former  Vaping Use  . Vaping status: Never Used  Substance and Sexual Activity  . Alcohol use: No    Alcohol/week: 0.0 standard drinks of alcohol  . Drug use: No  . Sexual activity: Defer   Social Drivers of Health   Food Insecurity: No Food Insecurity (05/18/2024)   Received from [facility]   Hunger Vital Sign   . Within the past 12 months, you worried that your food would run out before you got the money to buy more.: Never true   . Within the past 12 months, the food you bought just didn't last and you didn't have money to get more.: Never true  Transportation Needs: No Transportation Needs (05/18/2024)   Received from [facility] - Transportation   . In the past 12 months, has lack of transportation kept you from medical appointments or from getting medications?: No   . In the past 12 months, has lack of transportation kept you from  meetings, work, or from getting things needed for daily living?: No  Social Connections: Unknown (05/18/2024)   Received from [facility]   Social Connection and Isolation Panel   . In a typical week, how many times do you talk on the phone with family, friends, or neighbors?: Patient declined   . How often do you get together with friends or relatives?: Patient declined   . Do you belong to any clubs or organizations such as church groups, unions, fraternal or athletic groups, or school groups?: Patient declined   . How often do you attend meetings of the clubs or organizations you belong to?: Patient declined   . Are you married, widowed, divorced, separated, never married, or living with a partner?: Patient declined  Housing Stability: Unknown (05/30/2024)   Housing Stability Vital Sign   . Homeless in the Last Year: No    Review of Systems:   A 10 point review of systems is negative, except for the pertinent positives and negatives detailed in the HPI.  Vitals:   Vitals:   05/30/24 1119  BP: 137/70  Pulse: 77  SpO2: 96%  Weight: 90.7 kg (199 lb 15.3 oz)  Height: 185.4 cm (6' 0.99)     Body mass index is 26.39 kg/m.  Physical Exam:   Physical Exam Vitals and nursing note reviewed.  Constitutional:      General: in no acute distress.    Appearance: Normal appearance. Is not ill-appearing, toxic-appearing or diaphoretic.  HENT:     Head: Normocephalic and atraumatic.     Right Ear: External ear normal.     Left Ear: External ear normal.  Eyes:     General:        Right eye: No discharge.        Left eye: No discharge.     Extraocular Movements: Extraocular movements intact.     Pupils: Pupils are equal, round, and reactive to light.  Cardiovascular:     Rate and Rhythm: Normal rate and regular rhythm.     Pulses: Normal pulses.     Heart sounds: Normal heart sounds. No murmur heard.    No friction rub. No gallop.  Abdominal:     General: Bowel sounds are normal.   Skin:    General: Skin is warm and dry.     Capillary Refill: Capillary refill takes less than 2 seconds.  Neurological:     Mental Status: Patient is alert.     Lab and Imaging Results:  Results LABS Hemoglobin: 8 g/dL Iron: low  DIAGNOSTIC Pulmonary function test: FEV1 29%, lung volumes 77%, DLCO 60% (05/30/2024)   -- Computed Tomography   Pelvis -- --   Narrative  CLINICAL DATA:  Cholecystitis.  Awaiting percutaneous drain.  EXAM: CT ABDOMEN AND PELVIS WITH CONTRAST  TECHNIQUE: Multidetector CT imaging of the abdomen and pelvis was performed using the standard protocol following bolus administration of intravenous contrast.  RADIATION DOSE REDUCTION: This exam was performed according to the departmental dose-optimization program which includes automated exposure control, adjustment of the mA and/or kV according to patient size and/or use of iterative reconstruction technique.  CONTRAST:  OMNIPAQUE  IOHEXOL  300 MG/ML  SOLN  COMPARISON:  Right upper quadrant ultrasound 05/18/2024, chest CT 05/06/2022, 11/15/2019  FINDINGS: Lower chest: Heart is normal size. Calcified plaque over the 3 vessel coronary arteries. Evidence of previous TAVR. Calcified plaque over the descending thoracic aorta. Moderate elevation of the left hemidiaphragm. Mild bibasilar atelectatic change.  Hepatobiliary: Few small hypodensities over the liver unchanged likely cysts. Slight nodular contour to liver unchanged which may be due to cirrhosis. Moderate cholelithiasis with wall thickening and adjacent inflammatory change compatible recent ultrasound findings of acute cholecystitis. Biliary tree is otherwise unremarkable.  Pancreas: Normal.  Spleen: Normal.  Adrenals/Urinary Tract: Adrenal glands are normal. Kidneys are normal in size and demonstrate no hydronephrosis or nephrolithiasis. Ureters and bladder are normal.  Stomach/Bowel: Stomach and small bowel are normal.  Appendix is normal. Colon is unremarkable.  Vascular/Lymphatic: Calcified plaque over the abdominal aorta which is normal caliber. Remaining vascular structures are unremarkable. No significant adenopathy.  Reproductive: Prostate is unremarkable.  Other: Minimal patchy free fluid over the mid abdomen/mesentery as well as right pericolic gutter and mild-to-moderate free fluid over the pelvis.  Musculoskeletal: T12 compression fracture with slight interval progression. Minimal loss of vertebral body height of L1 described on prior lumbar spine series 2016 although no images for direct correlation.  IMPRESSION: 1. Moderate cholelithiasis with wall thickening and adjacent inflammatory change compatible with recent ultrasound findings of acute cholecystitis. 2. Mild ascites. 3. Slight nodular contour to liver unchanged which may be due to cirrhosis. Few small hypodensities over the liver unchanged likely cysts. 4. T12 compression fracture with slight interval progression. Minimal loss of vertebral body height of L1 described on prior lumbar spine report, although no images for direct correlation. 5. Aortic atherosclerosis. Atherosclerotic coronary artery disease.  Aortic Atherosclerosis (ICD10-I70.0).   Electronically Signed   By: Toribio Agreste M.D.   On: 05/19/2024 09:29  Assessment and Plan:   Diagnoses and all orders for this visit:  Stage 4 very severe COPD by GOLD classification (CMS/HHS-HCC)  Influenza vaccination declined by patient  Chronic hypoxemic respiratory failure (CMS/HHS-HCC)  Peripheral edema   Iron deficinecy anemia  Assessment & Plan Chronic Obstructive Pulmonary Disease (COPD) with chronic hypercapnic respiratory failure COPD is advanced, requiring continuous oxygen therapy. Pulmonary function tests indicate significant impairment, consistent with stage 2 to 3 COPD. - Continue Dulera  inhaler as prescribed. - Maintain oxygen therapy at current  settings.  Preoperative pulmonary risk assessment for cholecystitis with percutaneous cholecystostomy Advanced COPD with chronic hypercapnic respiratory failure increases surgical risk. Pulmonary function tests show FEV1 of 29%, lung volumes at 77%, and DLCO at 60%, indicating COPD stage 2 to 3. Iron deficiency anemia with hemoglobin level of 8 further elevates surgical risk. Overall mortality risk during surgery is approximately 13%, with an 85% chance of a successful outcome. Anemia needs addressing preoperatively to reduce surgical risk. -  Perform preoperative pulmonary risk assessment. - Refer to a hematologist for iron infusions to correct anemia before surgery.  Iron deficiency anemia Anemia is present with a hemoglobin level of 8, likely contributing to increased oxygen needs and surgical risk. Iron levels are low, necessitating intervention. - Refer to a hematologist for iron infusions to correct anemia.  Peripheral edema Mild peripheral edema is present, similar to previous evaluations. It may be related to fluid retention or cardiac issues.  Immunization counseling Discussed benefits and risks of various vaccines. Recommended influenza and pneumococcal vaccines due to potential benefits. Advised against COVID-19 vaccine due to poor strain match and potential side effects. RSV vaccine is considered effective. - Administer influenza vaccine. - Administer pneumococcal vaccine. - Do not administer COVID-19 vaccine.     Pulmonary preoperative evaluation   ARISCAT ([place]et)  Preoperative pulmonary risk index in adults- low risk: 13 % pulmonary postoperative complication rate [place] respiratory failure index- low risk: 5-10 % pulmonary postoperative complication rate Charlanne calculator for postoperative respiratory failure- low risk: 5-10 % probability of postoperative respiratory failure  There are several pulmonary complications that can occur after surgery. These include prolonged  mechanical ventilation, hypoxemia, atelectasis, pneumonia, and exacerbations of previous lung disease such as COPD or asthma. These risks are increased by the type of surgery (thoracic and upper abdominal surgeries carrying higher risks), length of surgery (longer surgery have increased risk), and the type of anesthesia. These complications are also affected by active smoking status as well a significant underlying lung disease (obstruction and restriction), older age, and functional status and exercise capacity.  Based on my history, physical exam, and review of available imaging/studies, this patient is at moderate risk of the aforementioned post-operative pulmonary complication.  However, from a pulmonary standpoint, there are no absolute contraindication in proceeding with elective surgery.  I would recommend in the postoperative phase aggressive pulmonary hygiene.  This included aspiration precautions if intubated, early mobilization with physical therapy and out of bed to chair.  The patient should be given an incentive spirometer as well.  I spent a total of 41 minutes in both face-to-face and non-face-to-face activities, excluding procedures performed, for this visit on the date of this encounter.   This note has been created using dictation software tool and any typographical errors are purely unintentional.  Patient received an After Visit Summary

## 2024-06-06 ENCOUNTER — Telehealth: Payer: Self-pay | Admitting: *Deleted

## 2024-06-06 NOTE — Telephone Encounter (Signed)
 Pulmonary at [facility] was wanting to know when this patient is going to have an appointment and they asked for Dr.Brahmanday and he is [place] of the [place] 3 [place], so they got him an appointment here at the cancer center with Dr. Georgina again and his appointment is on October 16 and he has at 11:15 for the time. I called  the patient and he already knows about it.

## 2024-06-08 ENCOUNTER — Inpatient Hospital Stay

## 2024-06-08 ENCOUNTER — Encounter: Payer: Self-pay | Admitting: Oncology

## 2024-06-08 ENCOUNTER — Other Ambulatory Visit: Payer: Self-pay | Admitting: Cardiovascular Disease

## 2024-06-08 ENCOUNTER — Inpatient Hospital Stay: Attending: Oncology | Admitting: Oncology

## 2024-06-08 VITALS — BP 134/52 | HR 64 | Temp 97.9°F | Resp 18 | Ht 73.0 in | Wt 198.3 lb

## 2024-06-08 DIAGNOSIS — R0602 Shortness of breath: Secondary | ICD-10-CM | POA: Insufficient documentation

## 2024-06-08 DIAGNOSIS — E039 Hypothyroidism, unspecified: Secondary | ICD-10-CM | POA: Insufficient documentation

## 2024-06-08 DIAGNOSIS — M4854XA Collapsed vertebra, not elsewhere classified, thoracic region, initial encounter for fracture: Secondary | ICD-10-CM | POA: Diagnosis not present

## 2024-06-08 DIAGNOSIS — D509 Iron deficiency anemia, unspecified: Secondary | ICD-10-CM | POA: Insufficient documentation

## 2024-06-08 DIAGNOSIS — Z809 Family history of malignant neoplasm, unspecified: Secondary | ICD-10-CM | POA: Diagnosis not present

## 2024-06-08 DIAGNOSIS — I110 Hypertensive heart disease with heart failure: Secondary | ICD-10-CM | POA: Insufficient documentation

## 2024-06-08 DIAGNOSIS — Z87891 Personal history of nicotine dependence: Secondary | ICD-10-CM | POA: Diagnosis not present

## 2024-06-08 DIAGNOSIS — R188 Other ascites: Secondary | ICD-10-CM | POA: Insufficient documentation

## 2024-06-08 DIAGNOSIS — J449 Chronic obstructive pulmonary disease, unspecified: Secondary | ICD-10-CM | POA: Diagnosis not present

## 2024-06-08 DIAGNOSIS — K7689 Other specified diseases of liver: Secondary | ICD-10-CM | POA: Diagnosis not present

## 2024-06-08 DIAGNOSIS — Z952 Presence of prosthetic heart valve: Secondary | ICD-10-CM | POA: Insufficient documentation

## 2024-06-08 DIAGNOSIS — I2510 Atherosclerotic heart disease of native coronary artery without angina pectoris: Secondary | ICD-10-CM | POA: Diagnosis not present

## 2024-06-08 DIAGNOSIS — I700 Atherosclerosis of aorta: Secondary | ICD-10-CM | POA: Insufficient documentation

## 2024-06-08 DIAGNOSIS — Z880 Allergy status to penicillin: Secondary | ICD-10-CM | POA: Diagnosis not present

## 2024-06-08 DIAGNOSIS — Z85820 Personal history of malignant melanoma of skin: Secondary | ICD-10-CM | POA: Insufficient documentation

## 2024-06-08 DIAGNOSIS — K8012 Calculus of gallbladder with acute and chronic cholecystitis without obstruction: Secondary | ICD-10-CM | POA: Diagnosis not present

## 2024-06-08 DIAGNOSIS — I5032 Chronic diastolic (congestive) heart failure: Secondary | ICD-10-CM | POA: Insufficient documentation

## 2024-06-08 DIAGNOSIS — Z79899 Other long term (current) drug therapy: Secondary | ICD-10-CM | POA: Insufficient documentation

## 2024-06-08 NOTE — Progress Notes (Signed)
 Patient is trying to get his gallbladder taken out but his labs have to look better. He isn't having any symptoms as of right now.

## 2024-06-08 NOTE — Progress Notes (Signed)
 [facility]  [phone] [phone]  ID: Kenneth Larsen OB: 10/28/1945  MR#: [id number]  RDW#:[id number]  Patient Care Team: [facility], [facility] as PCP - General (General Practice) Perla, Kenneth JINNY, MD as PCP - Cardiology (Cardiology)  CHIEF COMPLAINT: Iron deficiency anemia.  INTERVAL HISTORY: Patient is a 78 year old male with multiple medical problems who was noted to have a significantly reduced hemoglobin and iron stores on recent hospital admission.  He is referred for further evaluation and consideration of IV Venofer.  He currently feels well and is back to his baseline.  He has chronic shortness of breath and requires oxygen 24 hours/day.  He has not complained of any abdominal pain today.  He has no neurologic complaints.  He denies any recent fevers or illnesses.  He has a good appetite and denies weight loss.  He has no chest pain, cough, or hemoptysis.  He denies any nausea, vomiting, constipation, or diarrhea.  He has no melena or hematochezia.  He has no urinary complaints.  Patient offers no further specific complaints today.  REVIEW OF SYSTEMS:   Review of Systems  Constitutional: Negative.  Negative for fever, malaise/fatigue and weight loss.  Respiratory:  Positive for shortness of breath. Negative for cough and hemoptysis.   Cardiovascular: Negative.  Negative for chest pain and leg swelling.  Gastrointestinal: Negative.  Negative for abdominal pain, blood in stool and melena.  Genitourinary: Negative.  Negative for hematuria.  Musculoskeletal: Negative.  Negative for back pain.  Skin: Negative.  Negative for rash.  Neurological: Negative.  Negative for dizziness, focal weakness, weakness and headaches.  Psychiatric/Behavioral: Negative.  The patient is not nervous/anxious.     As per HPI. Otherwise, a complete review of systems is negative.  PAST MEDICAL HISTORY: Past Medical History:  Diagnosis Date   Acute  respiratory failure with hypoxia (HCC) 05/07/2022   Arthritis    CAD (coronary artery disease)    a. 09/2023 Cath: LM 15d, LAD 40p, LCX 31m, OM1 min irregs, RCA 40p, 30d.   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 04/2022 Echo: EF 55-60%, no rwma, Gri DD, nl RV fxn, RVSP 28.57mmHg. No AS/AI.    PAST SURGICAL HISTORY: Past Surgical History:  Procedure Laterality Date   CARDIAC CATHETERIZATION     IR PERC CHOLECYSTOSTOMY  05/19/2024   RIGHT/LEFT HEART CATH AND CORONARY ANGIOGRAPHY N/A 10/13/2018   Procedure: RIGHT/LEFT HEART CATH AND CORONARY ANGIOGRAPHY;  Surgeon: Mady Bruckner, MD;  Location: [facility] INVASIVE CV LAB;  Service: Cardiovascular;  Laterality: N/A;   TEE WITHOUT CARDIOVERSION N/A 11/15/2018   Procedure: TRANSESOPHAGEAL ECHOCARDIOGRAM (TEE);  Surgeon: Verlin Bruckner BIRCH, MD;  Location: [facility] INVASIVE CV LAB;  Service: Open Heart Surgery;  Laterality: N/A;   TRANSCATHETER AORTIC VALVE REPLACEMENT, TRANSFEMORAL  11/15/2018   TRANSCATHETER AORTIC VALVE REPLACEMENT, TRANSFEMORAL N/A 11/15/2018   Procedure: TRANSCATHETER AORTIC VALVE REPLACEMENT, TRANSFEMORAL;  Surgeon: Verlin Bruckner BIRCH, MD;  Location: [facility] INVASIVE CV LAB;  Service: Open Heart Surgery;  Laterality: N/A;   VASECTOMY      FAMILY HISTORY: Family History  Problem Relation Age of Onset   Cancer  Father     ADVANCED DIRECTIVES (Y/N):  N  HEALTH MAINTENANCE: Social History   Tobacco Use   Smoking status: Former    Current packs/day: 1.00    Average packs/day: 1 pack/day for 25.0 years (25.0 ttl pk-yrs)    Types: Cigarettes    Passive exposure: Past   Smokeless tobacco: Never  Vaping  Use   Vaping status: Never Used  Substance Use Topics   Alcohol use: Never   Drug use: Never     Colonoscopy:  PAP:  Bone density:  Lipid panel:  Allergies  Allergen Reactions   Penicillins Hives    Did it involve swelling of the face/tongue/throat, SOB, or low BP? No Did it involve sudden or severe rash/hives, skin peeling, or any reaction on the inside of your mouth or nose? No Did you need to seek medical attention at a hospital or doctor's office? No When did it last happen?      childhood allergy If all above answers are NO, may proceed with cephalosporin use.     Current Outpatient Medications  Medication Sig Dispense Refill   Cholecalciferol  (VITAMIN D -1000 MAX ST) 25 MCG (1000 UT) tablet Take 1,000 Units by mouth daily.      furosemide  (LASIX ) 40 MG tablet Take 1 tablet (40 mg total) by mouth daily. May take an extra 1 tablet ( 40 MG ) daily as needed for leg swelling. 120 tablet 5   levothyroxine  (SYNTHROID , LEVOTHROID) 50 MCG tablet Take 50 mcg by mouth daily before breakfast.      losartan  (COZAAR ) 50 MG tablet Take 1 tablet (50 mg total) by mouth daily. 90 tablet 3   metoprolol  succinate (TOPROL -XL) 25 MG 24 hr tablet Take 1 tablet (25 mg total) by mouth daily. 90 tablet 3   mometasone -formoterol  (DULERA ) 200-5 MCG/ACT AERO Inhale 2 puffs into the lungs 2 (two) times daily. 39 g 3   sodium chloride  flush (NS) 0.9 % SOLN 5-10 mLs by Intracatheter route daily. 300 mL 1   sodium chloride  flush 0.9 % SOLN injection 5 mLs by Intracatheter route as needed (Flush drain with 5 ccs daily). 300 mL 0   vitamin B-12 (CYANOCOBALAMIN ) 1000 MCG tablet Take 1,000 mcg by mouth daily.     atorvastatin  (LIPITOR) 40 MG tablet Take 1 tablet (40 mg total) by mouth daily. (Patient not taking: Reported on 06/08/2024) 90 tablet 3   No current facility-administered medications for this visit.    OBJECTIVE: Vitals:   06/08/24 1128  BP: (!) 134/52  Pulse: 64  Resp: 18  Temp: 97.9 F  (36.6 C)  SpO2: 100%     Body mass index is 26.16 kg/m.    ECOG FS:1 - Symptomatic but completely ambulatory  General: Well-developed, well-nourished, no acute distress. Eyes: Pink conjunctiva, anicteric sclera. HEENT: Normocephalic, moist mucous membranes. Lungs: No audible wheezing or coughing. Heart: Regular rate and rhythm. Abdomen: Soft, nontender, no obvious distention. Musculoskeletal: No edema, cyanosis, or clubbing. Neuro: Alert, answering all questions appropriately. Cranial nerves grossly intact. Skin: No rashes or petechiae noted. Psych: Normal affect. Lymphatics: No cervical, calvicular, axillary or inguinal LAD.   LAB RESULTS:  Lab Results  Component Value Date   NA 133 (L) 05/21/2024   K 3.7 05/21/2024   CL 97 (L) 05/21/2024   CO2 26 05/21/2024   GLUCOSE 101 (H) 05/21/2024   BUN 46 (H) 05/21/2024   CREATININE 1.22 05/21/2024   CALCIUM  8.3 (L) 05/21/2024   PROT 5.9 (L) 05/19/2024   ALBUMIN  2.9 (L) 05/19/2024   AST 16 05/19/2024   ALT 13 05/19/2024   ALKPHOS 38 05/19/2024   BILITOT 2.3 (H) 05/19/2024   GFRNONAA >60 05/21/2024   GFRAA >60 11/16/2018    Lab Results  Component Value Date   WBC 6.7 05/21/2024   NEUTROABS 5.3 05/21/2024   HGB 8.0 (L) 05/21/2024   HCT 25.0 (L) 05/21/2024   MCV 96.2 05/21/2024   PLT 199 05/21/2024   Lab Results  Component Value Date   IRON 32 (L) 05/21/2024   TIBC 213 (L) 05/21/2024   IRONPCTSAT 15 (L) 05/21/2024   No results found for: FERRITIN   STUDIES: IR Perc Cholecystostomy Result Date: 05/19/2024 INDICATION: 78 year old male with acute calculus cholecystitis. He is a poor operative candidate at the current time and therefore presents for percutaneous cholecystostomy tube placement. EXAM: CHOLECYSTOSTOMY MEDICATIONS: In patient currently receiving intravenous ciprofloxacin  and Flagyl . No additional antibiotic prophylaxis was administered. ANESTHESIA/SEDATION: Moderate (conscious) sedation was employed during  this procedure. A total of Versed  1 mg and Fentanyl  100 mcg was administered intravenously by the Radiology nurse. Moderate Sedation Time: 10 minutes. The patient's level of consciousness and vital signs were monitored continuously by radiology nursing throughout the procedure under my direct supervision. FLUOROSCOPY TIME:  Radiation exposure index: 12 mGy, air kerma COMPLICATIONS: None immediate. PROCEDURE: Informed written consent was obtained from the patient after a thorough discussion of the procedural risks, benefits and alternatives. All questions were addressed. Maximal Sterile Barrier Technique was utilized including caps, mask, sterile gowns, sterile gloves, sterile drape, hand hygiene and skin antiseptic. A timeout was performed prior to the initiation of the procedure. The right upper quadrant was interrogated with ultrasound. The dilated and thick-walled gallbladder is successfully visualized. No good transhepatic course could be identified sonographically. Therefore, the decision was made to proceed with trans peritoneal tube placement. A suitable skin entry site was selected and marked. Local anesthesia was attained by infiltration with 1% lidocaine . A small dermatotomy was made. Under real-time ultrasound guidance, a 21 gauge Accustick needle was carefully advanced through the abdominal wall and into the gallbladder lumen via the fundus. A wire was then coiled in the gallbladder lumen. The Accustick needle was exchanged for the transitional dilator which was advanced over the wire. Aspiration was performed yielding thick black bile. A sample was sent for Gram stain and culture. A 0.035 wire was then coiled in the gallbladder lumen. The transitional dilator was removed. The tract was dilated to 10 [place] and then a 10 [place] skater drainage catheter was advanced over the wire and formed. Images were obtained and stored for the medical record confirming placement. The catheter was connected to gravity  bag drainage and secured to the skin with 0 Prolene suture. Bandages were then applied. IMPRESSION: Successful placement of a transperitoneal 10 French percutaneous cholecystostomy tube for an indication of acute calculus cholecystitis. Electronically Signed   By: Wilkie Lent M.D.   On: 05/19/2024 14:48   MR ABDOMEN MRCP W WO CONTAST Result Date: 05/19/2024 CLINICAL DATA:  Cholecystitis EXAM: MRI ABDOMEN WITHOUT AND WITH CONTRAST (INCLUDING MRCP) TECHNIQUE: Multiplanar multisequence MR imaging of the abdomen was performed both before and after the administration of intravenous contrast. Heavily T2-weighted images of the biliary and pancreatic ducts were obtained. Post-processing was applied at the acquisition scanner with concurrent physician supervision which includes 3D reconstructions, MIPs, volume rendered images and/or shaded surface rendering. CONTRAST:  10mL GADAVIST  GADOBUTROL  1 MMOL/ML IV SOLN COMPARISON:  Same day CT abdomen and pelvis, ultrasound  abdomen dated 05/18/2024 FINDINGS: Lower chest: No acute findings. Hepatobiliary: Scattered hepatic cysts measuring up to 1.1 cm in peripheral anterior segment 4 (5:11). Mild intrahepatic bile duct dilation to the level of the hepatic hilum, where there are extensive inflammatory changes. No extrahepatic bile duct dilation. Distended gallbladder demonstrates cholelithiasis an circumferential mural thickening. Along the posteromedial aspect of the gallbladder, there is an irregular loculated fluid collection measuring 3.7 x 0.8 cm (4:23) adjacent to an area of mural discontinuity (4:22-24, 5:24,25). There is intrinsic T1 hyperintensity along the gallbladder wall and surrounding this fluid collection. Pancreas: No mass, inflammatory changes, or other parenchymal abnormality identified. Spleen:  Within normal limits in size and appearance. Adrenals/Urinary Tract: No adrenal nodules. No suspicious renal masses identified. No evidence of hydronephrosis.  Stomach/Bowel: Visualized portions within the abdomen are unremarkable. Normal appendix. Vascular/Lymphatic: No pathologically enlarged lymph nodes identified. No abdominal aortic aneurysm demonstrated. Other:  Small volume ascites. Musculoskeletal: No suspicious bone lesions identified. IMPRESSION: 1. Findings of acute cholecystitis complicated by contained perforation. Intrinsic T1 hyperintensity along the gallbladder wall and surrounding the perforation may represent hemorrhage. 2. Mild intrahepatic bile duct dilation to the level of the hepatic hilum, where there are extensive inflammatory changes related to the cholecystitis. No extrahepatic bile duct dilation. No substantial peribiliary enhancement to suggest cholangitis. 3. Small volume ascites. Electronically Signed   By: Limin  Xu M.D.   On: 05/19/2024 13:40   MR 3D Recon At Scanner Result Date: 05/19/2024 CLINICAL DATA:  Cholecystitis EXAM: MRI ABDOMEN WITHOUT AND WITH CONTRAST (INCLUDING MRCP) TECHNIQUE: Multiplanar multisequence MR imaging of the abdomen was performed both before and after the administration of intravenous contrast. Heavily T2-weighted images of the biliary and pancreatic ducts were obtained. Post-processing was applied at the acquisition scanner with concurrent physician supervision which includes 3D reconstructions, MIPs, volume rendered images and/or shaded surface rendering. CONTRAST:  10mL GADAVIST  GADOBUTROL  1 MMOL/ML IV SOLN COMPARISON:  Same day CT abdomen and pelvis, ultrasound abdomen dated 05/18/2024 FINDINGS: Lower chest: No acute findings. Hepatobiliary: Scattered hepatic cysts measuring up to 1.1 cm in peripheral anterior segment 4 (5:11). Mild intrahepatic bile duct dilation to the level of the hepatic hilum, where there are extensive inflammatory changes. No extrahepatic bile duct dilation. Distended gallbladder demonstrates cholelithiasis an circumferential mural thickening. Along the posteromedial aspect of the  gallbladder, there is an irregular loculated fluid collection measuring 3.7 x 0.8 cm (4:23) adjacent to an area of mural discontinuity (4:22-24, 5:24,25). There is intrinsic T1 hyperintensity along the gallbladder wall and surrounding this fluid collection. Pancreas: No mass, inflammatory changes, or other parenchymal abnormality identified. Spleen:  Within normal limits in size and appearance. Adrenals/Urinary Tract: No adrenal nodules. No suspicious renal masses identified. No evidence of hydronephrosis. Stomach/Bowel: Visualized portions within the abdomen are unremarkable. Normal appendix. Vascular/Lymphatic: No pathologically enlarged lymph nodes identified. No abdominal aortic aneurysm demonstrated. Other:  Small volume ascites. Musculoskeletal: No suspicious bone lesions identified. IMPRESSION: 1. Findings of acute cholecystitis complicated by contained perforation. Intrinsic T1 hyperintensity along the gallbladder wall and surrounding the perforation may represent hemorrhage. 2. Mild intrahepatic bile duct dilation to the level of the hepatic hilum, where there are extensive inflammatory changes related to the cholecystitis. No extrahepatic bile duct dilation. No substantial peribiliary enhancement to suggest cholangitis. 3. Small volume ascites. Electronically Signed   By: Limin  Xu M.D.   On: 05/19/2024 13:40   CT ABDOMEN PELVIS W CONTRAST Result Date: 05/19/2024 CLINICAL DATA:  Cholecystitis.  Awaiting percutaneous drain. EXAM: CT ABDOMEN AND  PELVIS WITH CONTRAST TECHNIQUE: Multidetector CT imaging of the abdomen and pelvis was performed using the standard protocol following bolus administration of intravenous contrast. RADIATION DOSE REDUCTION: This exam was performed according to the departmental dose-optimization program which includes automated exposure control, adjustment of the mA and/or kV according to patient size and/or use of iterative reconstruction technique. CONTRAST:  OMNIPAQUE   IOHEXOL  300 MG/ML  SOLN COMPARISON:  Right upper quadrant ultrasound 05/18/2024, chest CT 05/06/2022, 11/15/2019 FINDINGS: Lower chest: Heart is normal size. Calcified plaque over the 3 vessel coronary arteries. Evidence of previous TAVR. Calcified plaque over the descending thoracic aorta. Moderate elevation of the left hemidiaphragm. Mild bibasilar atelectatic change. Hepatobiliary: Few small hypodensities over the liver unchanged likely cysts. Slight nodular contour to liver unchanged which may be due to cirrhosis. Moderate cholelithiasis with wall thickening and adjacent inflammatory change compatible recent ultrasound findings of acute cholecystitis. Biliary tree is otherwise unremarkable. Pancreas: Normal. Spleen: Normal. Adrenals/Urinary Tract: Adrenal glands are normal. Kidneys are normal in size and demonstrate no hydronephrosis or nephrolithiasis. Ureters and bladder are normal. Stomach/Bowel: Stomach and small bowel are normal. Appendix is normal. Colon is unremarkable. Vascular/Lymphatic: Calcified plaque over the abdominal aorta which is normal caliber. Remaining vascular structures are unremarkable. No significant adenopathy. Reproductive: Prostate is unremarkable. Other: Minimal patchy free fluid over the mid abdomen/mesentery as well as right pericolic gutter and mild-to-moderate free fluid over the pelvis. Musculoskeletal: T12 compression fracture with slight interval progression. Minimal loss of vertebral body height of L1 described on prior lumbar spine series 2016 although no images for direct correlation. IMPRESSION: 1. Moderate cholelithiasis with wall thickening and adjacent inflammatory change compatible with recent ultrasound findings of acute cholecystitis. 2. Mild ascites. 3. Slight nodular contour to liver unchanged which may be due to cirrhosis. Few small hypodensities over the liver unchanged likely cysts. 4. T12 compression fracture with slight interval progression. Minimal loss of  vertebral body height of L1 described on prior lumbar spine report, although no images for direct correlation. 5. Aortic atherosclerosis. Atherosclerotic coronary artery disease. Aortic Atherosclerosis (ICD10-I70.0). Electronically Signed   By: Toribio Agreste M.D.   On: 05/19/2024 09:29   US  Abdomen Limited RUQ (LIVER/GB) Result Date: 05/18/2024 CLINICAL DATA:  Upper abdominal pain. EXAM: ULTRASOUND ABDOMEN LIMITED RIGHT UPPER QUADRANT COMPARISON:  Chest CTA dated 05/06/2022 FINDINGS: Gallbladder: Multiple gallstones layering in the dependent portion of the gallbladder measuring up to 8 mm in maximum diameter each. Gallbladder wall thickening with a maximum thickness of 4.9 mm. Small amount of pericholecystic fluid. Positive sonographic Murphy sign. Common bile duct: Diameter: 2.5 mm Liver: Diffusely echogenic and heterogeneous with lobulated contours. Portal vein is patent on color Doppler imaging with normal direction of blood flow towards the liver. Other: None. IMPRESSION: 1. Cholelithiasis with gallbladder wall thickening, pericholecystic fluid and positive sonographic Murphy sign compatible with acute cholecystitis. 2. Diffusely echogenic and heterogeneous liver with lobulated contours. This is nonspecific but can be seen with cirrhosis. Electronically Signed   By: Elspeth Bathe M.D.   On: 05/18/2024 13:57    ASSESSMENT: Iron deficiency anemia.  PLAN:    Iron deficiency anemia: Patient noted to have a hemoglobin 8.0 with reduced iron stores.  Unclear when patient's last colonoscopy was, but he likely could not tolerate this procedure given his poor pulmonary status.  He will benefit from 200 mg IV Venofer and returns to clinic 5 times over the next 1 to 2 weeks for treatment.  He will then return to clinic in 4 months  with repeat laboratory, further evaluation, and continuation of treatment if needed. Shortness of breath: Secondary to COPD.  Continue to follow-up with pulmonary as scheduled. Acute  cholecystitis: Patient will likely require cholecystectomy in the near future and reports an appointment with surgery at the end of the month.  I spent a total of 45 minutes reviewing chart data, face-to-face evaluation with the patient, counseling and coordination of care as detailed above.   Patient expressed understanding and was in agreement with this plan. He also understands that He can call clinic at any time with any questions, concerns, or complaints.    Kenneth JINNY Reusing, MD   06/08/2024 1:35 PM

## 2024-06-09 ENCOUNTER — Inpatient Hospital Stay

## 2024-06-09 VITALS — BP 110/51 | HR 70 | Temp 95.0°F | Resp 19

## 2024-06-09 DIAGNOSIS — D509 Iron deficiency anemia, unspecified: Secondary | ICD-10-CM

## 2024-06-09 MED ORDER — SODIUM CHLORIDE 0.9% FLUSH
10.0000 mL | Freq: Once | INTRAVENOUS | Status: AC | PRN
  Administered 2024-06-09: 10 mL
  Filled 2024-06-09: qty 10

## 2024-06-09 MED ORDER — IRON SUCROSE 20 MG/ML IV SOLN
200.0000 mg | Freq: Once | INTRAVENOUS | Status: AC
  Administered 2024-06-09: 200 mg via INTRAVENOUS
  Filled 2024-06-09: qty 10

## 2024-06-12 ENCOUNTER — Inpatient Hospital Stay

## 2024-06-12 VITALS — BP 101/47 | HR 69 | Temp 97.0°F | Resp 18

## 2024-06-12 DIAGNOSIS — D509 Iron deficiency anemia, unspecified: Secondary | ICD-10-CM | POA: Diagnosis not present

## 2024-06-12 MED ORDER — SODIUM CHLORIDE 0.9% FLUSH
10.0000 mL | Freq: Once | INTRAVENOUS | Status: AC | PRN
  Administered 2024-06-12: 10 mL
  Filled 2024-06-12: qty 10

## 2024-06-12 MED ORDER — IRON SUCROSE 20 MG/ML IV SOLN
200.0000 mg | Freq: Once | INTRAVENOUS | Status: AC
  Administered 2024-06-12: 200 mg via INTRAVENOUS
  Filled 2024-06-12: qty 10

## 2024-06-14 ENCOUNTER — Inpatient Hospital Stay

## 2024-06-14 VITALS — BP 122/50 | HR 72 | Temp 96.0°F | Resp 20

## 2024-06-14 DIAGNOSIS — D509 Iron deficiency anemia, unspecified: Secondary | ICD-10-CM | POA: Diagnosis not present

## 2024-06-14 MED ORDER — IRON SUCROSE 20 MG/ML IV SOLN
200.0000 mg | Freq: Once | INTRAVENOUS | Status: AC
  Administered 2024-06-14: 200 mg via INTRAVENOUS
  Filled 2024-06-14: qty 10

## 2024-06-14 NOTE — Patient Instructions (Signed)

## 2024-06-16 ENCOUNTER — Inpatient Hospital Stay

## 2024-06-16 VITALS — BP 115/50 | HR 68 | Temp 96.5°F | Resp 18

## 2024-06-16 DIAGNOSIS — D509 Iron deficiency anemia, unspecified: Secondary | ICD-10-CM | POA: Diagnosis not present

## 2024-06-16 MED ORDER — IRON SUCROSE 20 MG/ML IV SOLN
200.0000 mg | Freq: Once | INTRAVENOUS | Status: AC
  Administered 2024-06-16: 200 mg via INTRAVENOUS
  Filled 2024-06-16: qty 10

## 2024-06-16 NOTE — Patient Instructions (Signed)

## 2024-06-17 NOTE — Progress Notes (Unsigned)
 Cardiology Office Note  Date:  06/19/2024   ID:  Kenneth, Larsen Jul 30, 1946, MRN [id number]  PCP:  [facility]   Chief Complaint  Patient presents with   6 month follow up    Patient will need to have a gallbladder surgery; currently has a gallbladder drain implant & patient is currently having iron infusions.  Denies cardiac issues or concerns.      HPI:  Kenneth Larsen is a 78 y.o. 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)  who presents to clinic for aortic valve disease, s/p TAVR  Last seen by myself in clinic 10/24 Seen by one of our providers April 2025  Hospitalized September 2025 acute cholecystitis Discharge May 23, 2024 Presented to the hospital with abdominal pain, leukocytosis ultrasound showed gallstones, nonspecific liver changes concerning for cirrhosis MRCP performed complicated by contained perforation Cholecystostomy tube placed, Cipro  Flagyl , Surgical cultures growing Candida albicans Plan to keep drain in place 6 to 8 weeks before cholecystectomy - Recovery complicated by anemia hemoglobin 12.4 down to 8.0, now on iron infusions  Last echo September 2023, ef 55%  Presents today oxygen 3 liters Followed by pulmonary Home generator, portable tanks  No significant abdominal pain, denies shortness of breath above his baseline, no chest pain on exertion  Lost weight: 16 pounds since 10/24  Lab work reviewed Declining cholesterol medication  EKG personally reviewed by myself on todays visit EKG Interpretation Date/Time:  Monday June 19 2024 08:27:54 EDT Ventricular Rate:  73 PR Interval:  148 QRS Duration:  88 QT Interval:  384 QTC Calculation: 423 R Axis:   -1  Text Interpretation: Sinus rhythm with Premature atrial complexes Nonspecific T wave abnormality When compared with ECG of 19-Jun-2024 08:27, Premature atrial  complexes are now Present Confirmed by Perla Lye [phone]) on 06/19/2024 8:41:05 AM   Admission to hospital 9/23:  decompensated COPD and heart failure complicated with acute on chronic respiratory failure placed on Bipap for respiratory failure. Bronchodilator therapy and systemic corticosteroids.  Received furosemide  for diuresis.   Echo 9/23   1. Left ventricular ejection fraction, by estimation, is 55 to 60%. The  left ventricle has normal function. The left ventricle has no regional  wall motion abnormalities. Left ventricular diastolic parameters are  consistent with Grade I diastolic  dysfunction (impaired relaxation).   2. Right ventricular systolic function is normal. The right ventricular  size is normal. There is normal pulmonary artery systolic pressure. The  estimated right ventricular systolic pressure is 28.8 mmHg.   3. The mitral valve is normal in structure. No evidence of mitral valve  regurgitation. No evidence of mitral stenosis.   4. The aortic valve has been repaired/replaced. s/p TAVR. Aortic valve  regurgitation is not visualized. No aortic stenosis is present. Aortic  valve area, by VTI measures 1.45 cm. Aortic valve mean gradient measures  5.2 mmHg. Aortic valve Vmax measures  1.56 m/s.   5. The inferior vena cava is normal in size with greater than 50%  respiratory variability, suggesting right atrial pressure of 3 mmHg.   successful TAVR with Edwards Sapien 3 THV (size 29mm) using a transfemoral approach.   Post op echo showed EF 50-55%, normally functioning TAVR with mean gradient of 11 mm Hg and no PVL.   He was discharged on POD1 with aspirin  and plavix .  pre TAVR 10/13/18 showed showed mild nonobstructive coronary disease.    showed EF 55% with normally functioning TAVR with a mean gradient of 15 mm Hg.   PMH:   has a past medical history of Acute respiratory failure with hypoxia (HCC) (05/07/2022), Arthritis, CAD (coronary artery disease),  Cancer (HCC), Chronic diastolic (congestive) heart failure (HCC), COPD (chronic obstructive pulmonary disease) (HCC), Elevated troponin (05/06/2022), Hypertension, Hyponatremia (05/06/2022), Hypothyroidism, Hypoxemia, Respiratory failure (HCC) (10/08/2018), and Severe aortic stenosis.  PSH:    Past Surgical History:  Procedure Laterality Date   CARDIAC CATHETERIZATION     IR PERC CHOLECYSTOSTOMY  05/19/2024   RIGHT/LEFT HEART CATH AND CORONARY ANGIOGRAPHY N/A 10/13/2018   Procedure: RIGHT/LEFT HEART CATH AND CORONARY ANGIOGRAPHY;  Surgeon: Mady Bruckner, MD;  Location: [facility] INVASIVE CV LAB;  Service: Cardiovascular;  Laterality: N/A;   TEE WITHOUT CARDIOVERSION N/A 11/15/2018   Procedure: TRANSESOPHAGEAL ECHOCARDIOGRAM (TEE);  Surgeon: Verlin Bruckner BIRCH, MD;  Location: [facility] INVASIVE CV LAB;  Service: Open Heart Surgery;  Laterality: N/A;   TRANSCATHETER AORTIC VALVE REPLACEMENT, TRANSFEMORAL  11/15/2018   TRANSCATHETER AORTIC VALVE REPLACEMENT, TRANSFEMORAL N/A 11/15/2018   Procedure: TRANSCATHETER AORTIC VALVE REPLACEMENT, TRANSFEMORAL;  Surgeon: Verlin Bruckner BIRCH, MD;  Location: [facility] INVASIVE CV LAB;  Service: Open Heart Surgery;  Laterality: N/A;   VASECTOMY      Current Outpatient Medications  Medication Sig Dispense Refill   Cholecalciferol  (VITAMIN D -1000 MAX ST) 25 MCG (1000 UT) tablet Take 1,000 Units by mouth daily.      furosemide  (LASIX ) 40 MG tablet Take 1 tablet (40 mg total) by mouth daily. May take an extra 1 tablet ( 40 MG ) daily as needed for leg swelling. 120 tablet 5   levothyroxine  (SYNTHROID , LEVOTHROID) 50 MCG tablet Take 50 mcg by mouth daily before breakfast.      losartan  (COZAAR ) 50 MG tablet Take 1 tablet by mouth once daily 90 tablet 1   metoprolol  succinate (TOPROL -XL) 25 MG 24 hr tablet Take 1 tablet (25 mg total) by mouth daily. 90 tablet 3   mometasone -formoterol  (DULERA ) 200-5 MCG/ACT AERO Inhale 2 puffs into the lungs 2 (two) times daily. 39 g 3    sodium chloride  flush (NS) 0.9 % SOLN 5-10 mLs by Intracatheter route daily. 300 mL 1   sodium chloride  flush 0.9 % SOLN injection 5 mLs by Intracatheter route as needed (Flush drain with 5 ccs daily). 300 mL 0   vitamin B-12 (CYANOCOBALAMIN ) 1000 MCG tablet Take 1,000 mcg by mouth daily.     No current facility-administered medications for this visit.    Allergies:   Penicillins   Social History:  The patient  reports that he has quit smoking. His smoking use included cigarettes. He has a 25 pack-year smoking history. He has been exposed to tobacco smoke. He has never used smokeless tobacco. He reports that he does not drink alcohol and does not use drugs.   Family History:   family history includes Cancer in his father.    Review of Systems: Review of Systems  Constitutional: Negative.   HENT: Negative.    Respiratory: Negative.    Cardiovascular: Negative.   Gastrointestinal: Negative.   Musculoskeletal: Negative.   Neurological: Negative.   Psychiatric/Behavioral: Negative.    All other systems reviewed and are negative.   PHYSICAL EXAM: VS:  BP (!) 100/50 (BP Location: Left Arm, Patient Position: Sitting, Cuff Size: Normal)   Pulse 73   Ht 6' 1 (1.854 m)   Wt 201 lb 2  oz (91.2 kg)   SpO2 99% Comment: on 3 Liters of oxygen  BMI 26.54 kg/m  , BMI Body mass index is 26.54 kg/m. Constitutional:  oriented to person, place, and time. No distress.  HENT:  Head: Grossly normal Eyes:  no discharge. No scleral icterus.  Neck: No JVD, no carotid bruits  Cardiovascular: Regular rate and rhythm, no murmurs appreciated Pulmonary/Chest: Clear to auscultation bilaterally, no wheezes or rails Abdominal: Soft.  no distension.  no tenderness.  Drain in place Musculoskeletal: Normal range of motion Neurological:  normal muscle tone. Coordination normal. No atrophy Skin: Skin warm and dry Psychiatric: normal affect, pleasant  Recent Labs: 05/19/2024: ALT 13 05/21/2024: BUN 46;  Creatinine, Ser 1.22; Hemoglobin 8.0; Magnesium  2.3; Platelets 199; Potassium 3.7; Sodium 133   Lipid Panel Lab Results  Component Value Date   CHOL 117 10/14/2018   HDL 46 10/14/2018   LDLCALC 65 10/14/2018   TRIG 29 10/14/2018    Wt Readings from Last 3 Encounters:  06/19/24 201 lb 2 oz (91.2 kg)  06/08/24 198 lb 4.8 oz (89.9 kg)  05/26/24 211 lb (95.7 kg)     ASSESSMENT AND PLAN:  Problem List Items Addressed This Visit       Cardiology Problems   Hypertension (Chronic)   Relevant Orders   EKG 12-Lead (Completed)     Other   COPD (chronic obstructive pulmonary disease) (HCC) (Chronic)   Other Visit Diagnoses       Chronic diastolic heart failure (HCC)    -  Primary   Relevant Orders   EKG 12-Lead (Completed)     Coronary artery disease involving native coronary artery of native heart without angina pectoris       Relevant Orders   EKG 12-Lead (Completed)     History of transcatheter aortic valve replacement (TAVR)       Relevant Orders   EKG 12-Lead (Completed)     Severe aortic stenosis       Relevant Orders   EKG 12-Lead (Completed)      Preop cardiovascular evaluation gallbladder surgery Acceptable risk for surgery No further cardiac testing needed  Aortic valve stenosis/s/p TAVR Well-functioning valve September 2023 Repeat echo ordered to update our records  Chronic diastolic CHF Remains on Lasix  40 daily,  metoprolol  succinate, losartan  Unable to afford Farxiga  Oxygen 3 L in the day On BiPAP at nighttime  COPD On 3 L nasal cannula Non-smoker Followed by pulmonary  Essential hypertension Blood pressure is well controlled on today's visit. No changes made to the medications.     Signed, Velinda Lunger, M.D., Ph.D. [facility] [place], [place] [phone]

## 2024-06-19 ENCOUNTER — Encounter: Payer: Self-pay | Admitting: Cardiovascular Disease

## 2024-06-19 ENCOUNTER — Ambulatory Visit: Attending: Cardiovascular Disease | Admitting: Cardiovascular Disease

## 2024-06-19 VITALS — BP 100/50 | HR 73 | Ht 73.0 in | Wt 201.1 lb

## 2024-06-19 DIAGNOSIS — I2510 Atherosclerotic heart disease of native coronary artery without angina pectoris: Secondary | ICD-10-CM | POA: Insufficient documentation

## 2024-06-19 DIAGNOSIS — I5032 Chronic diastolic (congestive) heart failure: Secondary | ICD-10-CM | POA: Diagnosis not present

## 2024-06-19 DIAGNOSIS — Z952 Presence of prosthetic heart valve: Secondary | ICD-10-CM | POA: Diagnosis not present

## 2024-06-19 DIAGNOSIS — I10 Essential (primary) hypertension: Secondary | ICD-10-CM | POA: Diagnosis not present

## 2024-06-19 DIAGNOSIS — I350 Nonrheumatic aortic (valve) stenosis: Secondary | ICD-10-CM | POA: Insufficient documentation

## 2024-06-19 DIAGNOSIS — J449 Chronic obstructive pulmonary disease, unspecified: Secondary | ICD-10-CM | POA: Diagnosis present

## 2024-06-19 DIAGNOSIS — I491 Atrial premature depolarization: Secondary | ICD-10-CM

## 2024-06-19 NOTE — Patient Instructions (Addendum)
 Medication Instructions:  No changes  If you need a refill on your cardiac medications before your next appointment, please call your pharmacy.   Lab work: No new labs needed  Testing/Procedures: Your physician has requested that you have an echocardiogram. Echocardiography is a painless test that uses sound waves to create images of your heart. It provides your doctor with information about the size and shape of your heart and how well your heart's chambers and valves are working.   You may receive an ultrasound enhancing agent through an IV if needed to better visualize your heart during the echo. This procedure takes approximately one hour.  There are no restrictions for this procedure.  This will take place at [address] [facility] [address]) [address], [place] [address]  Please note: We ask at that you not bring children with you during ultrasound (echo/ vascular) testing. Due to room size and safety concerns, children are not allowed in the ultrasound rooms during exams. Our front office staff cannot provide observation of children in our lobby area while testing is being conducted. An adult accompanying a patient to their appointment will only be allowed in the ultrasound room at the discretion of the ultrasound technician under special circumstances. We apologize for any inconvenience.   Follow-Up: At [facility], you and your health needs are our priority.  As part of our continuing mission to provide you with exceptional heart care, we have created designated Provider Care Teams.  These Care Teams include your primary Cardiologist (physician) and Advanced Practice Providers (APPs -  Physician Assistants and Nurse Practitioners) who all work together to provide you with the care you need, when you need it.  You will need a follow up appointment in 12 months  Providers on your designated Care Team:   Nicolasa Ducking, NP Eula Listen, PA-C Cadence Fransico [place], [place]  COVID-19  Vaccine Information can be found at: [email or web address] For questions related to vaccine distribution or appointments, please email [email or web address] or call 5071822420.

## 2024-06-20 ENCOUNTER — Ambulatory Visit: Admitting: General Surgery

## 2024-06-20 ENCOUNTER — Encounter: Payer: Self-pay | Admitting: General Surgery

## 2024-06-20 VITALS — BP 109/54 | HR 73 | Ht 73.0 in | Wt 201.0 lb

## 2024-06-20 DIAGNOSIS — K810 Acute cholecystitis: Secondary | ICD-10-CM

## 2024-06-20 NOTE — Patient Instructions (Addendum)
 We will get you scheduled for a drain study in about 2 weeks.   You will follow up with Dr Marinda in 4 weeks with some lab work done prior.  You will get your lab work done at [facility]. Go in through the [facility] entrance and let them know that you have lab work to be done.   Please have your lab work done 1-2 days before your appointment with Dr Marinda.    We will call you about the drain check date and time.

## 2024-06-20 NOTE — Progress Notes (Signed)
 Outpatient Surgical Follow Up  06/20/2024  Kenneth Larsen is an 78 y.o. male.   Chief Complaint  Patient presents with   New Patient (Initial Visit)    Cholecystitis    HPI: Patient returns today status post cholecystostomy tube for acute cholecystitis.  He has a number of health problems including COPD on 2 to 3 L nasal cannula, aortic stenosis status post TAVR and coronary artery disease.  He reports feeling well from an abdominal standpoint.  He denies any abdominal pain.  He denies any jaundice.  He is tolerating a diet and having normal bowel function.  He denies acute exacerbation and shortness of breath or chest pain.  In the interim he has gone to see his pulmonologist.  He has undergone pulmonary restratification for any surgical intervention but was found to have worsening anemia.  He was referred to oncology and has since started iron infusions.  He has never had a colonoscopy he does state he has had Cologuard screening that has always been negative.  We also went through his cardiologist and had cardiac r risk stratification and there is no additional testing that needs to be done.    Past Medical History:  Diagnosis Date   Acute respiratory failure with hypoxia (HCC) 05/07/2022   Arthritis    CAD (coronary artery disease)    a. 09/2023 Cath: LM 15d, LAD 40p, LCX 36m, OM1 min irregs, RCA 40p, 30d.   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 04/2022 Echo: EF 55-60%, no rwma, Gri DD, nl RV fxn, RVSP 28.8mmHg. No AS/AI.    Past Surgical History:  Procedure Laterality Date   CARDIAC CATHETERIZATION     IR PERC CHOLECYSTOSTOMY  05/19/2024   RIGHT/LEFT HEART  CATH AND CORONARY ANGIOGRAPHY N/A 10/13/2018   Procedure: RIGHT/LEFT HEART CATH AND CORONARY ANGIOGRAPHY;  Surgeon: Mady Bruckner, MD;  Location: [facility] INVASIVE CV LAB;  Service: Cardiovascular;  Laterality: N/A;   TEE WITHOUT CARDIOVERSION N/A 11/15/2018   Procedure: TRANSESOPHAGEAL ECHOCARDIOGRAM (TEE);  Surgeon: Verlin Bruckner BIRCH, MD;  Location: [facility] INVASIVE CV LAB;  Service: Open Heart Surgery;  Laterality: N/A;   TRANSCATHETER AORTIC VALVE REPLACEMENT, TRANSFEMORAL  11/15/2018   TRANSCATHETER AORTIC VALVE REPLACEMENT, TRANSFEMORAL N/A 11/15/2018   Procedure: TRANSCATHETER AORTIC VALVE REPLACEMENT, TRANSFEMORAL;  Surgeon: Verlin Bruckner BIRCH, MD;  Location: [facility] INVASIVE CV LAB;  Service: Open Heart Surgery;  Laterality: N/A;   VASECTOMY      Family History  Problem Relation Age of Onset   Cancer Father     Social History:  reports that he has quit smoking. His smoking use included cigarettes. He has a 25 pack-year smoking history. He has been exposed to tobacco smoke. He has never used smokeless tobacco. He reports that he does not drink alcohol and does not use drugs.  Allergies:  Allergies  Allergen Reactions   Penicillins Hives    Did it involve swelling of the face/tongue/throat, SOB, or low BP? No Did it involve sudden or severe rash/hives, skin peeling, or any reaction on the inside of your mouth or nose? No Did you need to seek medical attention at a  hospital or doctor's office? No When did it last happen?      childhood allergy If all above answers are NO, may proceed with cephalosporin use.     Medications reviewed.    ROS Full ROS performed and is otherwise negative other than what is stated in HPI   BP (!) 109/54   Pulse 73   Ht 6' 1 (1.854 m)   Wt 201 lb (91.2 kg)   SpO2 98%   PF (!) 3 L/min   BMI 26.52 kg/m   Physical Exam Alert and oriented x 3, on 3 L nasal cannula, abdomen is soft, nontender nondistended, drain in the right upper quadrant  with bile in the bag.  Drain has been putting out approximately 75 to 100 cc of bile per day.   I reviewed his notes from his consultants.  He is undergoing iron transfusions.  His last labs show an anemia with a hemoglobin of 8.0 Assessment/Plan:  1. Cholecystitis, acute (Primary) Patient has a number of health problems including COPD, aortic stenosis and coronary artery disease.  He also has anemia which he is getting iron transfusions for.  He will need to have a tube study to make sure that the cystic duct is open so we will refer him to IR for this.  I would also like to see if he is responding to iron transfusions.  We will get labs and see him again in 4 weeks.  He is understandably a little bit frustrated about not being able to go ahead with surgery but I discussed with him his myriad of medical problems and that we need to be very conscientious about surgical timing.  He understands this and we will see him again in several weeks. - CBC with Differential/Platelet; Future - Comprehensive metabolic panel with GFR; Future  A total of 35 minutes was spent reviewing the patient's chart, performing history and physical and discussing treatment options with the patient  Jayson Endow, M.D. [facility]

## 2024-06-21 ENCOUNTER — Other Ambulatory Visit: Payer: Self-pay

## 2024-06-21 ENCOUNTER — Inpatient Hospital Stay

## 2024-06-21 VITALS — BP 112/51 | HR 66 | Temp 95.3°F | Resp 18

## 2024-06-21 DIAGNOSIS — D509 Iron deficiency anemia, unspecified: Secondary | ICD-10-CM

## 2024-06-21 MED ORDER — SODIUM CHLORIDE 0.9% FLUSH
5.0000 mL | Freq: Every day | INTRAVENOUS | 1 refills | Status: DC
  Filled 2024-06-21: qty 300, 30d supply, fill #0

## 2024-06-21 MED ORDER — IRON SUCROSE 20 MG/ML IV SOLN
200.0000 mg | Freq: Once | INTRAVENOUS | Status: AC
  Administered 2024-06-21: 200 mg via INTRAVENOUS
  Filled 2024-06-21: qty 10

## 2024-06-21 NOTE — Patient Instructions (Signed)

## 2024-07-05 NOTE — Progress Notes (Signed)
 Patient for IR Chole Tube Exchange on Thurs 07/06/24, I called and spoke with the patient's wife, Nena on the phone and gave pre-procedure instructions. Nena was made aware to be here at 2p and check in at the H&V entrance. Nena stated understanding.  Called 07/05/24

## 2024-07-06 ENCOUNTER — Other Ambulatory Visit: Payer: Self-pay | Admitting: Cardiovascular Disease

## 2024-07-06 ENCOUNTER — Ambulatory Visit
Admission: RE | Admit: 2024-07-06 | Discharge: 2024-07-06 | Disposition: A | Discharge status: Home / Self Care | Source: Ambulatory Visit | Attending: Urology | Admitting: Urology

## 2024-07-06 ENCOUNTER — Other Ambulatory Visit: Payer: Self-pay | Admitting: Urology

## 2024-07-06 DIAGNOSIS — Z4803 Encounter for change or removal of drains: Secondary | ICD-10-CM | POA: Diagnosis present

## 2024-07-06 DIAGNOSIS — K810 Acute cholecystitis: Secondary | ICD-10-CM | POA: Diagnosis not present

## 2024-07-06 HISTORY — PX: IR EXCHANGE BILIARY DRAIN: IMG6046

## 2024-07-06 MED ORDER — LIDOCAINE HCL 1 % IJ SOLN
INTRAMUSCULAR | Status: AC
  Filled 2024-07-06: qty 20

## 2024-07-06 MED ORDER — LIDOCAINE HCL 1 % IJ SOLN
2.0000 mL | Freq: Once | INTRAMUSCULAR | Status: AC
  Administered 2024-07-06: 2 mL via INTRADERMAL

## 2024-07-06 MED ORDER — IOHEXOL 300 MG/ML  SOLN
8.0000 mL | Freq: Once | INTRAMUSCULAR | Status: AC | PRN
  Administered 2024-07-06: 8 mL

## 2024-07-07 NOTE — Telephone Encounter (Signed)
 Pt is calling checking the status of refill. Also wants a 90 day supply

## 2024-07-14 ENCOUNTER — Other Ambulatory Visit
Admission: RE | Admit: 2024-07-14 | Discharge: 2024-07-14 | Disposition: A | Discharge status: Home / Self Care | Attending: General Surgery | Admitting: General Surgery

## 2024-07-14 DIAGNOSIS — K810 Acute cholecystitis: Secondary | ICD-10-CM | POA: Insufficient documentation

## 2024-07-14 LAB — CBC WITH DIFFERENTIAL/PLATELET
Abs Immature Granulocytes: 0.01 K/uL (ref 0.00–0.07)
Basophils Absolute: 0.1 K/uL (ref 0.0–0.1)
Basophils Relative: 1 %
Eosinophils Absolute: 0.1 K/uL (ref 0.0–0.5)
Eosinophils Relative: 3 %
HCT: 38 % — ABNORMAL LOW (ref 39.0–52.0)
Hemoglobin: 11.9 g/dL — ABNORMAL LOW (ref 13.0–17.0)
Immature Granulocytes: 0 %
Lymphocytes Relative: 23 %
Lymphs Abs: 1.2 K/uL (ref 0.7–4.0)
MCH: 29 pg (ref 26.0–34.0)
MCHC: 31.3 g/dL (ref 30.0–36.0)
MCV: 92.5 fL (ref 80.0–100.0)
Monocytes Absolute: 0.4 K/uL (ref 0.1–1.0)
Monocytes Relative: 7 %
Neutro Abs: 3.4 K/uL (ref 1.7–7.7)
Neutrophils Relative %: 66 %
Platelets: 269 K/uL (ref 150–400)
RBC: 4.11 MIL/uL — ABNORMAL LOW (ref 4.22–5.81)
RDW: 13.2 % (ref 11.5–15.5)
WBC: 5.2 K/uL (ref 4.0–10.5)
nRBC: 0 % (ref 0.0–0.2)

## 2024-07-14 LAB — COMPREHENSIVE METABOLIC PANEL WITH GFR
ALT: 9 U/L (ref 0–44)
AST: 17 U/L (ref 15–41)
Albumin: 3.9 g/dL (ref 3.5–5.0)
Alkaline Phosphatase: 71 U/L (ref 38–126)
Anion gap: 8 (ref 5–15)
BUN: 24 mg/dL — ABNORMAL HIGH (ref 8–23)
CO2: 33 mmol/L — ABNORMAL HIGH (ref 22–32)
Calcium: 8.9 mg/dL (ref 8.9–10.3)
Chloride: 98 mmol/L (ref 98–111)
Creatinine, Ser: 1.12 mg/dL (ref 0.61–1.24)
GFR, Estimated: >60 mL/min (ref >60)
Glucose, Bld: 83 mg/dL (ref 70–99)
Potassium: 4.3 mmol/L (ref 3.5–5.1)
Sodium: 138 mmol/L (ref 135–145)
Total Bilirubin: 0.7 mg/dL (ref 0.0–1.2)
Total Protein: 6.6 g/dL (ref 6.5–8.1)

## 2024-07-18 ENCOUNTER — Ambulatory Visit (INDEPENDENT_AMBULATORY_CARE_PROVIDER_SITE_OTHER): Admitting: General Surgery

## 2024-07-18 ENCOUNTER — Other Ambulatory Visit: Payer: Self-pay

## 2024-07-18 ENCOUNTER — Encounter: Payer: Self-pay | Admitting: General Surgery

## 2024-07-18 VITALS — BP 108/61 | HR 72 | Ht 73.0 in | Wt 198.0 lb

## 2024-07-18 DIAGNOSIS — K810 Acute cholecystitis: Secondary | ICD-10-CM

## 2024-07-18 MED ORDER — SODIUM CHLORIDE 0.9% FLUSH
5.0000 mL | Freq: Every day | INTRAVENOUS | 1 refills | Status: AC
  Filled 2024-07-18 – 2024-07-24 (×2): qty 300, 30d supply, fill #0

## 2024-07-18 NOTE — Patient Instructions (Signed)
 You have requested to have your gallbladder removed. This will be done at [facility] with Dr. Maurine Minister.  You will most likely be out of work 1-2 weeks for this surgery.  If you have FMLA or disability paperwork that needs filled out you may drop this off at our office or this can be faxed to [phone].  You will return after your post-op appointment with a lifting restriction for approximately 4 more weeks.  You will be able to eat anything you would like to following surgery. But, start by eating a bland diet and advance this as tolerated. The Gallbladder diet is below, please go as closely by this diet as possible prior to surgery to avoid any further attacks.  Please see the (blue)pre-care form that you have been given today. Our surgery scheduler will call you to verify surgery date and to go over information.   If you have any questions, please call our office.  Laparoscopic Cholecystectomy Laparoscopic cholecystectomy is surgery to remove the gallbladder. The gallbladder is located in the upper right part of the abdomen, behind the liver. It is a storage sac for bile, which is produced in the liver. Bile aids in the digestion and absorption of fats. Cholecystectomy is often done for inflammation of the gallbladder (cholecystitis). This condition is usually caused by a buildup of gallstones (cholelithiasis) in the gallbladder. Gallstones can block the flow of bile, and that can result in inflammation and pain. In severe cases, emergency surgery may be required. If emergency surgery is not required, you will have time to prepare for the procedure. Laparoscopic surgery is an alternative to open surgery. Laparoscopic surgery has a shorter recovery time. Your common bile duct may also need to be examined during the procedure. If stones are found in the common bile duct, they may be removed. LET [facility] CARE PROVIDER KNOW ABOUT: Any allergies you have. All medicines you are taking,  including vitamins, herbs, eye drops, creams, and over-the-counter medicines. Previous problems you or members of your family have had with the use of anesthetics. Any blood disorders you have. Previous surgeries you have had.  Any medical conditions you have. RISKS AND COMPLICATIONS Generally, this is a safe procedure. However, problems may occur, including: Infection. Bleeding. Allergic reactions to medicines. Damage to other structures or organs. A stone remaining in the common bile duct. A bile leak from the cyst duct that is clipped when your gallbladder is removed. The need to convert to open surgery, which requires a larger incision in the abdomen. This may be necessary if your surgeon thinks that it is not safe to continue with a laparoscopic procedure. BEFORE THE PROCEDURE Ask your health care provider about: Changing or stopping your regular medicines. This is especially important if you are taking diabetes medicines or blood thinners. Taking medicines such as aspirin and ibuprofen. These medicines can thin your blood. Do not take these medicines before your procedure if your health care provider instructs you not to. Follow instructions from your health care provider about eating or drinking restrictions. Let your health care provider know if you develop a cold or an infection before surgery. Plan to have someone take you home after the procedure. Ask your health care provider how your surgical site will be marked or identified. You may be given antibiotic medicine to help prevent infection. PROCEDURE To reduce your risk of infection: Your health care team will wash or sanitize their hands. Your skin will be washed with soap. An IV  tube may be inserted into one of your veins. You will be given a medicine to make you fall asleep (general anesthetic). A breathing tube will be placed in your mouth. The surgeon will make several small cuts (incisions) in your abdomen. A thin,  lighted tube (laparoscope) that has a tiny camera on the end will be inserted through one of the small incisions. The camera on the laparoscope will send a picture to a TV screen (monitor) in the operating room. This will give the surgeon a good view inside your abdomen. A gas will be pumped into your abdomen. This will expand your abdomen to give the surgeon more room to perform the surgery. Other tools that are needed for the procedure will be inserted through the other incisions. The gallbladder will be removed through one of the incisions. After your gallbladder has been removed, the incisions will be closed with stitches (sutures), staples, or skin glue. Your incisions may be covered with a bandage (dressing). The procedure may vary among health care providers and hospitals. AFTER THE PROCEDURE Your blood pressure, heart rate, breathing rate, and blood oxygen level will be monitored often until the medicines you were given have worn off. You will be given medicines as needed to control your pain.   This information is not intended to replace advice given to you by your health care provider. Make sure you discuss any questions you have with your health care provider.   Document Released: 08/10/2005 Document Revised: 05/01/2015 Document Reviewed: 03/22/2013 Elsevier Interactive Patient Education 2016 [employer]vier Inc.   Low-Fat Diet for Gallbladder Conditions A low-fat diet can be helpful if you have pancreatitis or a gallbladder condition. With these conditions, your pancreas and gallbladder have trouble digesting fats. A healthy eating plan with less fat will help rest your pancreas and gallbladder and reduce your symptoms. WHAT DO I NEED TO KNOW ABOUT THIS DIET? Eat a low-fat diet. Reduce your fat intake to less than 20-30% of your total daily calories. This is less than 50-60 g of fat per day. Remember that you need some fat in your diet. Ask your dietician what your daily goal should  be. Choose nonfat and low-fat healthy foods. Look for the words "nonfat," "low fat," or "fat free." As a guide, look on the label and choose foods with less than 3 g of fat per serving. Eat only one serving. Avoid alcohol. Do not smoke. If you need help quitting, talk with your health care provider. Eat small frequent meals instead of three large heavy meals. WHAT FOODS CAN I EAT? Grains Include healthy grains and starches such as potatoes, wheat bread, fiber-rich cereal, and brown rice. Choose whole grain options whenever possible. In adults, whole grains should account for 45-65% of your daily calories.  Fruits and Vegetables Eat plenty of fruits and vegetables. Fresh fruits and vegetables add fiber to your diet. Meats and Other Protein Sources Eat lean meat such as chicken and pork. Trim any fat off of meat before cooking it. Eggs, fish, and beans are other sources of protein. In adults, these foods should account for 10-35% of your daily calories. Dairy Choose low-fat milk and dairy options. Dairy includes fat and protein, as well as calcium.  Fats and Oils Limit high-fat foods such as fried foods, sweets, baked goods, sugary drinks.  Other Creamy sauces and condiments, such as mayonnaise, can add extra fat. Think about whether or not you need to use them, or use smaller amounts or low fat options.  WHAT FOODS ARE NOT RECOMMENDED? High fat foods, such as: [employer]. Ice cream. [place] toast. Sweet rolls. Pizza. Cheese bread. Foods covered with batter, butter, creamy sauces, or cheese. Fried foods. Sugary drinks and desserts. Foods that cause gas or bloating   This information is not intended to replace advice given to you by your health care provider. Make sure you discuss any questions you have with your health care provider.   Document Released: 08/15/2013 Document Reviewed: 08/15/2013 Elsevier Interactive Patient Education [employer]

## 2024-07-19 ENCOUNTER — Ambulatory Visit: Payer: Self-pay | Admitting: General Surgery

## 2024-07-19 DIAGNOSIS — K810 Acute cholecystitis: Secondary | ICD-10-CM

## 2024-07-19 NOTE — Progress Notes (Signed)
 Outpatient Surgical Follow Up   Kenneth Larsen is an 78 y.o. male.   Chief Complaint  Patient presents with   Follow-up    HPI: Kenneth Larsen is a 78 year old male with past medical history significant for aortic stenosis status post TAVR, congestive heart failure and COPD on 2 to 3 L nasal cannula who presents in consultation for cholecystitis.  The patient had cholecystitis back in September.  At that time he had a cholecystostomy tube.  He Kenneth Larsen presents today after having an IR tube exchange.  Clinically he reports doing well.  He denies any abdominal pain.  He denies any nausea or vomiting.  He is eating and having normal bowel function.  He says that his oxygen requirements have not increased.  He reports that his swelling in his legs has improved a little bit.  Past Medical History:  Diagnosis Date   Acute respiratory failure with hypoxia (HCC) 05/07/2022   Arthritis    CAD (coronary artery disease)    a. 09/2023 Cath: LM 15d, LAD 40p, LCX 2m, OM1 min irregs, RCA 40p, 30d.   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 04/2022 Echo: EF 55-60%, no rwma, Gri DD, nl RV fxn, RVSP 28.7mmHg. No AS/AI.    Past Surgical History:  Procedure Laterality Date   CARDIAC CATHETERIZATION     IR EXCHANGE BILIARY DRAIN  07/06/2024   IR PERC CHOLECYSTOSTOMY  05/19/2024   RIGHT/LEFT HEART CATH AND CORONARY ANGIOGRAPHY N/A 10/13/2018   Procedure: RIGHT/LEFT HEART CATH AND CORONARY ANGIOGRAPHY;  Surgeon: Mady Bruckner, MD;  Location: [facility] INVASIVE CV LAB;  Service: Cardiovascular;  Laterality: N/A;   TEE WITHOUT CARDIOVERSION N/A 11/15/2018   Procedure: TRANSESOPHAGEAL ECHOCARDIOGRAM  (TEE);  Surgeon: Verlin Bruckner BIRCH, MD;  Location: [facility] INVASIVE CV LAB;  Service: Open Heart Surgery;  Laterality: N/A;   TRANSCATHETER AORTIC VALVE REPLACEMENT, TRANSFEMORAL  11/15/2018   TRANSCATHETER AORTIC VALVE REPLACEMENT, TRANSFEMORAL N/A 11/15/2018   Procedure: TRANSCATHETER AORTIC VALVE REPLACEMENT, TRANSFEMORAL;  Surgeon: Verlin Bruckner BIRCH, MD;  Location: [facility] INVASIVE CV LAB;  Service: Open Heart Surgery;  Laterality: N/A;   VASECTOMY      Family History  Problem Relation Age of Onset   Cancer Father     Social History:  reports that he has quit smoking. His smoking use included cigarettes. He has a 25 pack-year smoking history. He has been exposed to tobacco smoke. He has never used smokeless tobacco. He reports that he does not drink alcohol and does not use drugs.  Allergies:  Allergies  Allergen Reactions   Penicillins Hives    Did it involve swelling of the face/tongue/throat, SOB, or low BP? No Did it involve sudden or severe rash/hives, skin peeling, or any reaction on the inside of your mouth or nose? No Did you need to seek medical attention at a hospital or doctor's office? No When did it last happen?      childhood allergy If all above answers are NO, may proceed with cephalosporin use.     Medications reviewed.    ROS Full ROS performed and is otherwise negative other than what is stated  in HPI   BP 108/61   Pulse 72   Ht 6' 1 (1.854 m)   Wt 198 lb (89.8 kg)   SpO2 98%   BMI 26.12 kg/m   Physical Exam On 3 L nasal cannula, alert and oriented, abdomen is soft, slightly protuberant, nontender and nondistended, cholecystostomy tube in place with bile in the bag.    I reviewed his labs and his hemoglobin has improved with iron  infusions.  I have also reviewed his notes from his pulmonologist as well as his cardiologist.  There are no further tests that need to be done prior to any surgical intervention and he is at obviously increased risk  of complications.    I have reviewed the interventional radiology tube exchange.  He has multiple stones in the gallbladder and the cystic duct is not open.  Assessment/Plan:  I had a very lengthy discussion with the patient, his wife and his son who was conferencing via phone.  I discussed our options are to continue to wait and see if the cystic duct will open and then we could potentially perform right clamping trial.  I also discussed with him that some people have a cholecystostomy tube for the rest of their lives.  We also discussed the role of surgical intervention.  I shared with him the ACS and his clip risk calculator that shows that he has a 20% risk of serious complication and 7% risk of death.  I discussed that these percentages are not prohibitive from a surgical perspective although he is indeed at very increased risk.  The patient is adamant that he wants surgery to get the tube out.  I had a long discussion with the patient about the risk, benefits alternatives of the procedure including risk of infection, bleeding, injury to common bile duct, retained stone, bile leak, need for either open procedure or subtotal cholecystectomy and procedure abortion.  I also discussed the risk of complications including risk of cardiac problems, need for intubation postoperatively and death.  I discussed with the patient that we would resend his DNR during the procedure but he needs to talk about what he would want should he code or need a tracheostomy.  The patient is fully aware of the high risk of this procedure but again wishes to proceed.  He has already undergone pulmonary risk stratification as well as cardiology risk stratification.  We will have anesthesia review his chart.  We will plan for robotic assisted cholecystectomy.  A total of 55 minutes was spent reviewing the patient's chart, performing an interval history and physical and discussing treatment options with the patient.   Jayson Endow, M.D. [facility]

## 2024-07-24 ENCOUNTER — Other Ambulatory Visit: Payer: Self-pay

## 2024-07-24 ENCOUNTER — Telehealth: Payer: Self-pay | Admitting: General Surgery

## 2024-07-24 NOTE — Telephone Encounter (Signed)
 Patient has been advised of Pre-Admission date/time, and Surgery date at [facility].  Surgery Date: 07/31/24 Preadmission Testing Date: 07/26/24 (phone 1p-4p)  Patient informed of the scheduling process and surgery information given at time of office visit.  Patient has been made aware to call [phone], between 1-3:00pm the day before surgery, to find out what time to arrive for surgery.

## 2024-07-26 ENCOUNTER — Other Ambulatory Visit: Payer: Self-pay

## 2024-07-26 ENCOUNTER — Inpatient Hospital Stay
Admission: RE | Admit: 2024-07-26 | Discharge: 2024-07-26 | Disposition: A | Source: Ambulatory Visit | Attending: General Surgery

## 2024-07-26 HISTORY — DX: Sleep apnea, unspecified: G47.30

## 2024-07-26 HISTORY — DX: Chronic diastolic (congestive) heart failure: I50.32

## 2024-07-26 HISTORY — DX: Presence of prosthetic heart valve: Z95.2

## 2024-07-26 HISTORY — DX: Atherosclerotic heart disease of native coronary artery without angina pectoris: I25.10

## 2024-07-26 HISTORY — DX: Acute cholecystitis: K81.0

## 2024-07-26 NOTE — Patient Instructions (Addendum)
 Your procedure is scheduled on: Monday 07/31/24 Report to the Registration Desk on the 1st floor of the [facility]. To find out your arrival time, please call [phone] between 1PM - 3PM on: Friday 07/28/24 If your arrival time is 6:00 am, do not arrive before that time as the Medical Mall entrance doors do not open until 6:00 am.  REMEMBER: Instructions that are not followed completely may result in serious medical risk, up to and including death; or upon the discretion of your surgeon and anesthesiologist your surgery may need to be rescheduled.  Do not eat food or drink fluids after midnight the night before surgery.  No gum chewing or hard candies.   One week prior to surgery: Stop Anti-inflammatories (NSAIDS) such as Advil, Aleve, Ibuprofen, Motrin, Naproxen, Naprosyn and Aspirin  based products such as Excedrin, Goody's Powder, BC Powder. Stop ANY OVER THE COUNTER supplements until after surgery.  You may however, continue to take Tylenol  if needed for pain up until the day of surgery.   Continue taking all of your other prescription medications up until the day of surgery.  ON THE DAY OF SURGERY ONLY TAKE THESE MEDICATIONS WITH SIPS OF WATER :  levothyroxine  (SYNTHROID , LEVOTHROID) 50 MCG  metoprolol  succinate (TOPROL -XL) 25 MG   Use inhalers on the day of surgery and bring to the hospital. mometasone -formoterol  (DULERA ) 200-5 MCG/ACT AERO    No Alcohol for 24 hours before or after surgery.  No Smoking including e-cigarettes for 24 hours before surgery.  No chewable tobacco products for at least 6 hours before surgery.  No nicotine patches on the day of surgery.  Do not use any recreational drugs for at least a week (preferably 2 weeks) before your surgery.  Please be advised that the combination of cocaine and anesthesia may have negative outcomes, up to and including death. If you test positive for cocaine, your surgery will be cancelled.  On the morning of  surgery brush your teeth with toothpaste and water , you may rinse your mouth with mouthwash if you wish. Do not swallow any toothpaste or mouthwash.  Use CHG Soap or wipes as directed on instruction sheet.  Do not wear jewelry, make-up, hairpins, clips or nail polish.  For welded (permanent) jewelry: bracelets, anklets, waist bands, etc.  Please have this removed prior to surgery.  If it is not removed, there is a chance that hospital personnel will need to cut it off on the day of surgery.  Do not wear lotions, powders, or perfumes.   Do not shave body hair from the neck down 48 hours before surgery.  Contact lenses, hearing aids and dentures may not be worn into surgery.  Do not bring valuables to the hospital. [facility] is not responsible for any missing/lost belongings or valuables.   Bring your B-PAP to the hospital in case you may have to spend the night.   Notify your doctor if there is any change in your medical condition (cold, fever, infection).  Wear comfortable clothing (specific to your surgery type) to the hospital.  After surgery, you can help prevent lung complications by doing breathing exercises.  Take deep breaths and cough every 1-2 hours. Your doctor may order a device called an Incentive Spirometer to help you take deep breaths. When coughing or sneezing, hold a pillow firmly against your incision with both hands. This is called "splinting." Doing this helps protect your incision. It also decreases belly discomfort.  If you are being admitted to the hospital overnight,  leave your suitcase in the car. After surgery it may be brought to your room.  In case of increased patient census, it may be necessary for you, the patient, to continue your postoperative care in the Same Day Surgery department.  If you are being discharged the day of surgery, you will not be allowed to drive home. You will need a responsible individual to drive you home and stay with you for 24  hours after surgery.   If you are taking public transportation, you will need to have a responsible individual with you.  Please call the Pre-admissions Testing Dept. at [phone] if you have any questions about these instructions.  Surgery Visitation Policy:  Patients having surgery or a procedure may have two visitors.  Children under the age of 35 must have an adult with them who is not the patient.  Inpatient Visitation:    Visiting hours are 7 a.m. to 8 p.m. Up to four visitors are allowed at one time in a patient room. The visitors may rotate out with other people during the day.  One visitor age 33 or older may stay with the patient overnight and must be in the room by 8 p.m.   Merchandiser, Retail to address health-related social needs:  [email or web address]                                                                                                           Preparing for Surgery with CHLORHEXIDINE  GLUCONATE (CHG) Soap  Chlorhexidine  Gluconate (CHG) Soap  o An antiseptic cleaner that kills germs and bonds with the skin to continue killing germs even after washing  o Used for showering the night before surgery and morning of surgery  Before surgery, you can play an important role by reducing the number of germs on your skin.  CHG (Chlorhexidine  gluconate) soap is an antiseptic cleanser which kills germs and bonds with the skin to continue killing germs even after washing.  Please do not use if you have an allergy to CHG or antibacterial soaps. If your skin becomes reddened/irritated stop using the CHG.  1. Shower the NIGHT BEFORE SURGERY with CHG soap.  2. If you choose to wash your hair, wash your hair first as usual with your normal shampoo.  3. After shampooing, rinse your hair and body thoroughly to remove the shampoo.  4. Use CHG as you would any other liquid soap. You can apply CHG directly to the skin and wash gently with a clean  washcloth.  5. Apply the CHG soap to your body only from the neck down. Do not use on open wounds or open sores. Avoid contact with your eyes, ears, mouth, and genitals (private parts). Wash face and genitals (private parts) with your normal soap.  6. Wash thoroughly, paying special attention to the area where your surgery will be performed.  7. Thoroughly rinse your body with warm water .  8. Do not shower/wash with your normal soap after using and rinsing off the CHG soap.  9. Do not use lotions,  oils, etc., after showering with CHG.  10. Pat yourself dry with a clean towel.  11. Wear clean pajamas to bed the night before surgery.  12. Place clean sheets on your bed the night of your shower and do not sleep with pets.  13. Do not apply any deodorants/lotions/powders.  14. Please wear clean clothes to the hospital.  15. Remember to brush your teeth with your regular toothpaste.

## 2024-07-27 ENCOUNTER — Encounter: Payer: Self-pay | Admitting: General Surgery

## 2024-07-27 NOTE — Progress Notes (Signed)
 Perioperative / Anesthesia Services  Pre-Admission Testing Clinical Review / Pre-Operative Anesthesia Consult  Date: 07/27/24  PATIENT DEMOGRAPHICS: Name: Kenneth Larsen DOB: 08-25-1945 MRN:   [id number]  Note: Available PAT nursing documentation and vital signs have been reviewed. Clinical nursing staff has updated patient's PMH/PSHx, current medication list, and drug allergies/intolerances to ensure complete and comprehensive history available to assist care teams in MDM as it pertains to the aforementioned surgical procedure and anticipated anesthetic course. Extensive review of available clinical information personally performed. Nursing documentation reviewed. Rhea PMH and PSHx updated with any diagnoses and/or procedures that I have knowledge of that may have been inadvertently omitted during his intake with the pre-admission testing department's nursing staff.  PLANNED SURGICAL PROCEDURE(S):   Case: [id number] Date/Time: 07/31/24 0715   Procedure: CHOLECYSTECTOMY, ROBOT-ASSISTED, LAPAROSCOPIC - w/ICG   Anesthesia type: General   Diagnosis: Acute cholecystitis [K81.0]   Pre-op diagnosis: Acute cholecystitis   Location: [facility] OR ROOM 07 / [facility]S FOR ANEST[facility]ESIA [facility]   Surgeons: Marinda Jayson KIDD, MD        CLINICAL DISCUSSION: Kenneth Larsen is a 78 y.o. male who is submitted for pre-surgical anesthesia review and clearance prior to him undergoing the above procedure. Patient is a Former Smoker (25 pack years). 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  Patient is followed by cardiology (Gollan, MD). He was last seen in the cardiology clinic on 06/19/2024; notes reviewed. At the time of his clinic visit, patient doing well overall from a  cardiovascular perspective. Patient denied any chest pain, shortness of breath, PND, orthopnea, palpitations, significant peripheral edema, weakness, fatigue, vertiginous symptoms, or presyncope/syncope. Patient with a past medical history significant for cardiovascular diagnoses. Documented physical exam was grossly benign, providing no evidence of acute exacerbation and/or decompensation of the patient's known cardiovascular conditions.  Patient underwent diagnostic RIGHT/LEFT heart catheterization on 10/13/2018.  Study revealed mild to moderate nonobstructive coronary artery disease; 40% proximal LAD, 30% mid LCx, 40% proximal RCA, and distal RCA.  Hemodynamics: mean RA = 12 mmHg, mean PA = 37 mmHg, mean PCWP = 25 mmHg, AO saturation = 99%, PA saturation = 78%, CO = 6.8 L/min, and CI 3.0 L/min/m.  Patient with known severe aortic valve stenosis.  He was referred to TAVR clinic for consideration of valve replacement.  Coronary CTA was performed on 10/27/2018 in preparation for valve replacement.  Calcium  score was not reported.  Patient determined to be a suitable candidate for TAVR, which he underwent via a transfemoral approach on 11/15/2018.  29 mm Edwards SAPIEN 3 bioprosthetic aortic valve was placed.  Patient with a history of known carotid artery disease.  Most recent carotid Doppler study performed on 11/11/2018 revealed a 1-39% stenosis of the patient's BILATERAL carotid arteries.  Vertebral arteries demonstrated antegrade flow.  There were normal flow hemodynamics seen in the subclavian arteries.  Most recent TTE performed on 05/07/2022 revealed a normal left ventricular systolic function with an EF of 55-60%. There was no significant LVH.  There were no regional wall motion abnormalities. Left ventricular diastolic Doppler parameters consistent with abnormal relaxation (G1DD). Right ventricular size and function normal with a TAPSE measuring 2.2 cm  (normal range >/= 1.6 cm).  RVSP = 28.8 mmHg.  bioprosthetic aortic valve well-seated and functioning properly with a mean transvalvular gradient of 5.2 mmHg; AVA (  VTI) 1.45 cm.  There was mild tricuspid valve regurgitation.  All transvalvular gradients were noted to be normal providing no evidence of hemodynamically significant valvular stenosis. Aorta normal in size with no evidence of ectasia or aneurysmal dilatation.  Blood pressure well controlled at 100/50 mmHg on currently prescribed diuretic (furosemide ), ARB (losartan ), and beta-blocker (metoprolol  succinate) therapies.  Patient is not on any type of lipid-lowering therapies for his HLD diagnosis and further ASCVD prevention. Patient is not diabetic.  Patient does have an OSAH diagnosis that is treated with nocturnal BiPAP therapy.  Patient reported be compliant with therapy.  Due to his hypoxemic respiratory failure diagnosis, patient requires the use of supplemental oxygen at 3L/[place].  Patient is able to complete all of his  ADL/IADLs without cardiovascular limitation.  Per the [facility]ASI, patient is able to achieve at least 4 METS of physical activity without experiencing any significant degree of angina/anginal equivalent symptoms. No changes were made to his medication regimen during his visit with cardiology.  Patient scheduled to follow-up with outpatient cardiology in 12 months or sooner if needed.  Kenneth Larsen is scheduled for an elective CHOLECYSTECTOMY, ROBOT-ASSISTED, LAPAROSCOPIC on 07/31/2024 with Dr. Jayson MALVA Endow, MD. Given patient's past medical history significant for cardiovascular diagnoses, presurgical cardiac clearance was sought by the PAT team. Per cardiology, based ACC/AHA guidelines, the patient's past medical history, and the amount of time since his last clinic visit, this patient would be at an overall ACCEPTABLE risk for the planned procedure without further cardiovascular testing or intervention at this time.   In review of his medication reconciliation, the patient  is not noted to be taking any type of anticoagulation or antiplatelet therapies that would need to be held during his perioperative course.  Patient denies previous perioperative complications with anesthesia in the past. In review his EMR, it is noted that patient underwent a MAC anesthetic course at [facility] (ASA IV) in 11/15/2018 without documented complications.   MOST RECENT VITAL SIGNS:    07/26/2024    1:54 PM 07/18/2024    9:42 AM 06/21/2024    9:15 AM  Vitals with BMI  Height 6' 1.5 6' 1   Weight 198 lbs 198 lbs   BMI 25.77 26.13   Systolic  108 112  Diastolic  61 51  Pulse  72 66   PROVIDERS/SPECIALISTS: NOTE: Primary physician provider listed below. Patient may have been seen by APP or partner within same practice.   PROVIDER ROLE / SPECIALTY LAST OV  Endow Jayson MALVA, MD General Surgery (Surgeon) 07/18/2024  Center, [facility] Primary Care Provider ???  Perla Lye, MD Cardiology 06/19/2024  Jacobo Lye, MD Hematology 06/08/2024  Parris Manna, MD Pulmonary Medicine 05/30/2024   ALLERGIES: Allergies  Allergen Reactions   Penicillins Hives    Childhood allergy    Tape Rash    Broke out     CURRENT HOME MEDICATIONS: No current facility-administered medications for this encounter.    acetaminophen  (TYLENOL ) 500 MG tablet   Cholecalciferol  (VITAMIN D -1000 MAX ST) 25 MCG (1000 UT) tablet   furosemide  (LASIX ) 40 MG tablet   levothyroxine  (SYNTHROID , LEVOTHROID) 50 MCG tablet   losartan  (COZAAR ) 50 MG tablet   metoprolol  succinate (TOPROL -XL) 25 MG 24 hr tablet   mometasone -formoterol  (DULERA ) 200-5 MCG/ACT AERO   OXYGEN   sodium chloride  flush (NS) 0.9 % SOLN   vitamin B-12 (CYANOCOBALAMIN ) 1000 MCG tablet   HISTORY: Past Medical History:  Diagnosis Date   (HFpEF) heart failure  with preserved ejection fraction (HCC)    Acute respiratory failure with hypoxia (HCC) 05/07/2022   Aortic atherosclerosis    Arthritis     Bilateral carotid artery disease    CAD (coronary artery disease)    a. 09/2023 Cath: LM 15d, LAD 40p, LCX 22m, OM1 min irregs, RCA 40p, 30d.   Cholecystitis, acute    COPD (chronic obstructive pulmonary disease) (HCC)    History of transcatheter aortic valve replacement (TAVR) 11/15/2018   a.) 29 mm Edwards Sapien 3 AoV   HLD (hyperlipidemia)    Hypertension    Hyponatremia 05/06/2022   Hypothyroidism    Hypoxemia    IDA (iron  deficiency anemia)    Melanoma in situ (HCC) 2010   OSA treated with BiPAP    Respiratory failure (HCC) 10/08/2018   Severe aortic stenosis    a. 10/2018 s/p TAVR w/ Edwards Sapien 3 29 mm AoV; b. 04/2022 Echo: EF 55-60%, no rwma, Gri DD, nl RV fxn, RVSP 28.39mmHg. No AS/AI.   Status post bilateral cataract extraction    Supplemental oxygen dependent (3L/[place])    Past Surgical History:  Procedure Laterality Date   CARDIAC CATHETERIZATION     CATARACT EXTRACTION Bilateral    IR EXCHANGE BILIARY DRAIN  07/06/2024   IR PERC CHOLECYSTOSTOMY  05/19/2024   RIGHT/LEFT HEART CATH AND CORONARY ANGIOGRAPHY N/A 10/13/2018   Procedure: RIGHT/LEFT HEART CATH AND CORONARY ANGIOGRAPHY;  Surgeon: Mady Bruckner, MD;  Location: [facility] INVASIVE CV LAB;  Service: Cardiovascular;  Laterality: N/A;   TEE WITHOUT CARDIOVERSION N/A 11/15/2018   Procedure: TRANSESOPHAGEAL ECHOCARDIOGRAM (TEE);  Surgeon: Verlin Bruckner BIRCH, MD;  Location: [facility] INVASIVE CV LAB;  Service: Open Heart Surgery;  Laterality: N/A;   TRANSCATHETER AORTIC VALVE REPLACEMENT, TRANSFEMORAL  11/15/2018   TRANSCATHETER AORTIC VALVE REPLACEMENT, TRANSFEMORAL N/A 11/15/2018   Procedure: TRANSCATHETER AORTIC VALVE REPLACEMENT, TRANSFEMORAL;  Surgeon: Verlin Bruckner BIRCH, MD;  Location: [facility] INVASIVE CV LAB;  Service: Open Heart Surgery;  Laterality: N/A;   VASECTOMY     Family History  Problem Relation Age of Onset   Cancer Father    Social History   Tobacco Use   Smoking status: Former    Current packs/day:  1.00    Average packs/day: 1 pack/day for 25.0 years (25.0 ttl pk-yrs)    Types: Cigarettes    Passive exposure: Past   Smokeless tobacco: Never  Substance Use Topics   Alcohol use: Never   LABS:  Hospital Outpatient Visit on 07/14/2024  Component Date Value Ref Range Status   Sodium 07/14/2024 138  135 - 145 mmol/L Final   Potassium 07/14/2024 4.3  3.5 - 5.1 mmol/L Final   Chloride 07/14/2024 98  98 - 111 mmol/L Final   CO2 07/14/2024 33 (H)  22 - 32 mmol/L Final   Glucose, Bld 07/14/2024 83  70 - 99 mg/dL Final   Glucose reference range applies only to samples taken after fasting for at least 8 hours.   BUN 07/14/2024 24 (H)  8 - 23 mg/dL Final   Creatinine, Ser 07/14/2024 1.12  0.61 - 1.24 mg/dL Final   Calcium  07/14/2024 8.9  8.9 - 10.3 mg/dL Final   Total Protein 88/78/7974 6.6  6.5 - 8.1 g/dL Final   Albumin 88/78/7974 3.9  3.5 - 5.0 g/dL Final   AST 88/78/7974 17  15 - 41 U/L Final   ALT 07/14/2024 9  0 - 44 U/L Final   Alkaline Phosphatase 07/14/2024 71  38 - 126 U/L Final  Total Bilirubin 07/14/2024 0.7  0.0 - 1.2 mg/dL Final   GFR, Estimated 07/14/2024 >60  >60 mL/min Final   Comment: (NOTE) Calculated using the CKD-EPI Creatinine Equation (2021)    Anion gap 07/14/2024 8  5 - 15 Final   Performed at [facility], [address] [place], [place] [address]   WBC 07/14/2024 5.2  4.0 - 10.5 K/uL Final   RBC 07/14/2024 4.11 (L)  4.22 - 5.81 MIL/uL Final   Hemoglobin 07/14/2024 11.9 (L)  13.0 - 17.0 g/dL Final   HCT 88/78/7974 38.0 (L)  39.0 - 52.0 % Final   MCV 07/14/2024 92.5  80.0 - 100.0 fL Final   MCH 07/14/2024 29.0  26.0 - 34.0 pg Final   MCHC 07/14/2024 31.3  30.0 - 36.0 g/dL Final   RDW 88/78/7974 13.2  11.5 - 15.5 % Final   Platelets 07/14/2024 269  150 - 400 K/uL Final   nRBC 07/14/2024 0.0  0.0 - 0.2 % Final   Neutrophils Relative % 07/14/2024 66  % Final   Neutro Abs 07/14/2024 3.4  1.7 - 7.7 K/uL Final   Lymphocytes Relative 07/14/2024 23  %  Final   Lymphs Abs 07/14/2024 1.2  0.7 - 4.0 K/uL Final   Monocytes Relative 07/14/2024 7  % Final   Monocytes Absolute 07/14/2024 0.4  0.1 - 1.0 K/uL Final   Eosinophils Relative 07/14/2024 3  % Final   Eosinophils Absolute 07/14/2024 0.1  0.0 - 0.5 K/uL Final   Basophils Relative 07/14/2024 1  % Final   Basophils Absolute 07/14/2024 0.1  0.0 - 0.1 K/uL Final   Immature Granulocytes 07/14/2024 0  % Final   Abs Immature Granulocytes 07/14/2024 0.01  0.00 - 0.07 K/uL Final   Performed at [facility], [address] [place], [place] [address]    ECG: Date: 06/19/2024  Time ECG obtained: 0827 AM Rate: 73 bpm Rhythm: Sinus rhythm with PACs Axis (leads I and aVF): normal Intervals: PR 148 ms. QRS 88 ms. QTc 423 ms. ST segment and T wave changes: Nonspecific anterior T wave abnormality.  Evidence of a possible, age undetermined, prior infarct:  No Comparison: Similar to previous tracing obtained on 06/19/2024; PACs now present.   IMAGING / PROCEDURES: MR ABDOMEN MRCP W WO CONTAST performed on 05/19/2024 Findings of acute cholecystitis complicated by contained perforation. Intrinsic T1 hyperintensity along the gallbladder wall and surrounding the perforation may represent hemorrhage. Mild intrahepatic bile duct dilation to the level of the hepatic hilum, where there are extensive inflammatory changes related to the cholecystitis. No extrahepatic bile duct dilation. No substantial peribiliary enhancement to suggest cholangitis. Small volume ascites.  CT ABDOMEN PELVIS W CONTRAST performed on 05/19/2024 Moderate cholelithiasis with wall thickening and adjacent inflammatory change compatible with recent ultrasound findings of acute cholecystitis. Mild ascites. Slight nodular contour to liver unchanged which may be due to cirrhosis. Few small hypodensities over the liver unchanged likely  cysts. T12 compression fracture with slight interval progression. Minimal loss of vertebral  body height of L1 described on prior lumbar spine report, although no images for direct correlation. Aortic atherosclerosis.  Atherosclerotic coronary artery disease.  TRANSTHORACIC ECHOCARDIOGRAM performed on 05/07/2022 Left ventricular ejection fraction, by estimation, is 55 to 60%. The left ventricle has normal function. The left ventricle has no regional wall motion abnormalities. Left ventricular diastolic parameters are consistent with Grade I diastolic dysfunction (impaired relaxation).  Right ventricular systolic function is normal. The right ventricular size is normal. There is normal  pulmonary artery systolic pressure. The estimated right ventricular systolic pressure is 28.8 mmHg.  The mitral valve is normal in structure. No evidence of mitral valve regurgitation. No evidence of mitral stenosis.  The aortic valve has been repaired/replaced. s/p TAVR. Aortic valve regurgitation is not visualized. No aortic stenosis is present. Aortic valve area, by VTI measures 1.45 cm. Aortic valve mean gradient measures 5.2 mmHg. Aortic valve Vmax measures  1.56 m/s.  The inferior vena cava is normal in size with greater than 50% respiratory variability, suggesting right atrial pressure of 3 mmHg.   VAS US  CAROTID performed on 11/11/2018 Velocities in the right ICA are consistent with a 1-39% stenosis.  Velocities in the left ICA are consistent with a 1-39% stenosis.  Bilateral vertebral arteries demonstrate antegrade flow.   CT CORONARY MORPH W/CTA COR W/SCORE W/CA W/CM &/OR WO/CM performed on 10/27/2018 Tri leaflet AV with annular area 622 mm 2 suitable for a 29 mm Sapiens 3 valve Optimum angiographic angle for deployment RAO 10 Caudal 32 degrees Normal aortic root size 3.3 cm Coronary arteries sufficient height above annulus for deployment  RIGHT/LEFT HEART CATHETERIZATION AND CORONARY ANGIOGRAPHY performed on 10/13/2018 Mild to moderate, non-obstructive coronary artery disease. 15% distal  LM 40% proximal LAD 30% mid LCx 40% proximal RCA 30% distal RCA Moderately elevated left heart, right heart, and pulmonary artery pressures. Normal Fick cardiac output/index. RA (mean): 12 mmHg RV (S/EDP): 54/13 mmHg PA (S/D, mean): 54/24 (37) mmHg PCWP (mean): 25 mmHg Ao sat: 99% PA sat: 78% Fick CO: 6.8 L/min Fick CI: 3.0 L/min/m^2  Recommendations: Medical therapy and risk factor modification to prevent progression of moderate coronary artery disease. Restart diuresis this afternoon; will initiate furosemide  40 mg IV BID. Outpatient TAVR clinic referral for further workup/management of severe aortic stenosis.    IMPRESSION AND PLAN: Kaiyu J Shear has been referred for pre-anesthesia review and clearance prior to him undergoing the planned anesthetic and procedural courses. Available labs, pertinent testing, and imaging results were personally reviewed by me in preparation for upcoming operative/procedural course. [facility] medical record has been updated following extensive record review and patient interview with PAT staff.   This patient has been appropriately cleared by cardiology with an overall ACCEPTABLE risk of patient experiencing significant perioperative cardiovascular complications. here at [facility]. Based on clinical review performed today (07/27/24), barring any significant acute changes in the patient's overall condition, it is anticipated that he will be able to proceed with the planned surgical intervention. Any acute changes in clinical condition may necessitate his procedure being postponed and/or cancelled. Patient will meet with anesthesia team (MD and/or CRNA) on the day of his procedure for preoperative evaluation/assessment. Questions regarding anesthetic course will be fielded at that time.   Pre-surgical instructions were reviewed with the patient during his PAT appointment, and questions were fielded to satisfaction by PAT  clinical staff. He has been instructed on which medications that he will need to hold prior to surgery, as well as the ones that have been deemed safe/appropriate to take on the day of his procedure. As part of the general education provided by PAT, patient made aware both verbally and in writing, that he would need to abstain from the use of any illegal substances during his perioperative course. He was advised that failure to follow the provided instructions could necessitate case cancellation or result in serious perioperative complications up to and including death. Patient encouraged to contact PAT and/or his surgeon's office to  discuss any questions or concerns that may arise prior to surgery; verbalized understanding.   Dorise Pereyra, MSN, APRN, FNP-C, CEN [facility]  Perioperative Services Nurse Practitioner Phone: [phone] Fax: [phone] 07/27/24 12:19 PM  NOTE: This note has been prepared using Dragon dictation software. Despite my best ability to proofread, there is always the potential that unintentional transcriptional errors may still occur from this process.

## 2024-07-28 ENCOUNTER — Encounter: Payer: Self-pay | Admitting: General Surgery

## 2024-07-28 NOTE — Anesthesia Preprocedure Evaluation (Addendum)
 Anesthesia Evaluation  Patient identified by MRN, date of birth, ID band Patient awake    Reviewed: Allergy & Precautions, H&P , NPO status , Patient's Chart, lab work & pertinent test results  Airway Mallampati: II  TM Distance: >3 FB Neck ROM: full    Dental  (+) Partial Upper, Partial Lower   Pulmonary sleep apnea (BiPAP) , COPD, former smoker (25 pack years) Advanced COPD with chronic hypercapnic respiratory failure increases surgical risk. Pulmonary function tests show FEV1 of 29%, lung volumes at 77%, and DLCO at 60%, indicating COPD stage 2 to 3   Pulmonary exam normal        Cardiovascular hypertension, + CAD (09/2023 Cath: LM 15d, LAD 40p, LCX 57m, OM1 min   irregs, RCA 40p, 30d.), + Peripheral Vascular Disease (Bilateral carotid artery disease) and +CHF  Normal cardiovascular exam+ Valvular Problems/Murmurs (11/15/2018: History of transcatheter aortic valve replacement (TAVR)     Comment:  a.) 29 mm Edwards Sapien 3 AoV)   04/2022 Echo: EF 55-60%, no rwma, Gri DD, nl RV fxn, RVSP               28.82mmHg. No AS/AI.  EKG 10/25: Sinus rhythm Nonspecific T wave abnormality When compared with ECG of 22-May-2024 15:24, ST no longer depressed in Anterior leads Confirmed by Perla Lye [phone]) on 06/19/2024 9:02:09 AM   Neuro/Psych negative neurological ROS  negative psych ROS   GI/Hepatic negative GI ROS, Neg liver ROS,,,  Endo/Other  Hypothyroidism    Renal/GU      Musculoskeletal   Abdominal Normal abdominal exam  (+)   Peds  Hematology  (+) Blood dyscrasia, anemia   Anesthesia Other Findings PAT Note Reviewed: HGB: 11.9  Past Medical History: No date: (HFpEF) heart failure with preserved ejection fraction (HCC) 05/07/2022: Acute respiratory failure with hypoxia (HCC) No date: Aortic atherosclerosis No date: Arthritis No date: Bilateral carotid artery disease No date: CAD (coronary artery disease)      Comment:  a. 09/2023 Cath: LM 15d, LAD 40p, LCX 23m, OM1 min               irregs, RCA 40p, 30d. No date: Cholecystitis, acute No date: COPD (chronic obstructive pulmonary disease) (HCC) 11/15/2018: History of transcatheter aortic valve replacement (TAVR)     Comment:  a.) 29 mm Edwards Sapien 3 AoV No date: HLD (hyperlipidemia) No date: Hypertension 05/06/2022: Hyponatremia No date: Hypothyroidism No date: Hypoxemia No date: IDA (iron  deficiency anemia) 2010: Melanoma in situ (HCC) No date: OSA treated with BiPAP 10/08/2018: Respiratory failure (HCC) No date: Severe aortic stenosis     Comment:  a. 10/2018 s/p TAVR w/ Edwards Sapien 3 29 mm AoV; b.               04/2022 Echo: EF 55-60%, no rwma, Gri DD, nl RV fxn, RVSP               28.52mmHg. No AS/AI. No date: Status post bilateral cataract extraction  Past Surgical History: No date: CARDIAC CATHETERIZATION No date: CATARACT EXTRACTION; Bilateral 07/06/2024: IR EXCHANGE BILIARY DRAIN 05/19/2024: IR PERC CHOLECYSTOSTOMY 10/13/2018: RIGHT/LEFT HEART CATH AND CORONARY ANGIOGRAPHY; N/A     Comment:  Procedure: RIGHT/LEFT HEART CATH AND CORONARY               ANGIOGRAPHY;  Surgeon: Mady Bruckner, MD;  Location:               [facility] INVASIVE CV LAB;  Service: Cardiovascular;  Laterality: N/A; 11/15/2018: TEE WITHOUT CARDIOVERSION; N/A     Comment:  Procedure: TRANSESOPHAGEAL ECHOCARDIOGRAM (TEE);                Surgeon: Verlin Lonni BIRCH, MD;  Location: [facility]               INVASIVE CV LAB;  Service: Open Heart Surgery;                Laterality: N/A; 11/15/2018: TRANSCATHETER AORTIC VALVE REPLACEMENT, TRANSFEMORAL 11/15/2018: TRANSCATHETER AORTIC VALVE REPLACEMENT, TRANSFEMORAL; N/A     Comment:  Procedure: TRANSCATHETER AORTIC VALVE REPLACEMENT,               TRANSFEMORAL;  Surgeon: Verlin Lonni BIRCH, MD;                Location: [facility] INVASIVE CV LAB;  Service: Open Heart               Surgery;  Laterality:  N/A; No date: VASECTOMY     Reproductive/Obstetrics negative OB ROS                              Anesthesia Physical Anesthesia Plan  ASA: 3  Anesthesia Plan: General ETT   Post-op Pain Management: Tylenol  PO (pre-op)* and Ketamine IV*   Induction: Intravenous  PONV Risk Score and Plan: 3 and Ondansetron , Dexamethasone  and Midazolam   Airway Management Planned: Oral ETT  Additional Equipment:   Intra-op Plan:   Post-operative Plan: Extubation in OR  Informed Consent: I have reviewed the patients History and Physical, chart, labs and discussed the procedure including the risks, benefits and alternatives for the proposed anesthesia with the patient or authorized representative who has indicated his/her understanding and acceptance.     Dental Advisory Given  Plan Discussed with: CRNA and Surgeon  Anesthesia Plan Comments:          Anesthesia Quick Evaluation

## 2024-07-30 MED ORDER — SODIUM CHLORIDE 0.9 % IV SOLN
2.0000 g | INTRAVENOUS | Status: AC
  Administered 2024-07-31: 2 g via INTRAVENOUS

## 2024-07-30 MED ORDER — CHLORHEXIDINE GLUCONATE CLOTH 2 % EX PADS: 6.0000 | MEDICATED_PAD | Freq: Once | CUTANEOUS | Status: DC

## 2024-07-30 MED ORDER — INDOCYANINE GREEN 25 MG IV SOLR
1.2500 mg | Freq: Once | INTRAVENOUS | Status: AC
  Administered 2024-07-31: 1.25 mg via INTRAVENOUS
  Filled 2024-07-30: qty 10

## 2024-07-30 MED ORDER — LACTATED RINGERS IV SOLN: INTRAVENOUS | Status: DC

## 2024-07-30 MED ORDER — ACETAMINOPHEN 500 MG PO TABS
1000.0000 mg | ORAL_TABLET | Freq: Once | ORAL | Status: AC
  Administered 2024-07-31: 1000 mg via ORAL

## 2024-07-30 MED ORDER — ORAL CARE MOUTH RINSE: 15.0000 mL | Freq: Once | OROMUCOSAL | Status: AC

## 2024-07-30 MED ORDER — CHLORHEXIDINE GLUCONATE 0.12 % MT SOLN
15.0000 mL | Freq: Once | OROMUCOSAL | Status: AC
  Administered 2024-07-31: 15 mL via OROMUCOSAL

## 2024-07-31 ENCOUNTER — Encounter: Admission: RE | Disposition: A | Payer: Self-pay | Source: Home / Self Care | Attending: General Surgery

## 2024-07-31 ENCOUNTER — Ambulatory Visit: Payer: Self-pay | Admitting: Urgent Care

## 2024-07-31 ENCOUNTER — Other Ambulatory Visit: Payer: Self-pay

## 2024-07-31 ENCOUNTER — Observation Stay
Admission: RE | Admit: 2024-07-31 | Discharge: 2024-08-01 | Disposition: A | Attending: General Surgery | Admitting: General Surgery

## 2024-07-31 ENCOUNTER — Encounter: Payer: Self-pay | Admitting: Urgent Care

## 2024-07-31 DIAGNOSIS — K810 Acute cholecystitis: Principal | ICD-10-CM | POA: Diagnosis present

## 2024-07-31 HISTORY — DX: Dependence on supplemental oxygen: Z99.81

## 2024-07-31 HISTORY — DX: Unspecified diastolic (congestive) heart failure: I50.30

## 2024-07-31 HISTORY — DX: Disorder of arteries and arterioles, unspecified: I77.9

## 2024-07-31 HISTORY — DX: Atherosclerosis of aorta: I70.0

## 2024-07-31 HISTORY — DX: Iron deficiency anemia, unspecified: D50.9

## 2024-07-31 HISTORY — DX: Cataract extraction status, left eye: Z98.41

## 2024-07-31 HISTORY — DX: Cataract extraction status, left eye: Z98.42

## 2024-07-31 HISTORY — DX: Obstructive sleep apnea (adult) (pediatric): G47.33

## 2024-07-31 HISTORY — DX: Hyperlipidemia, unspecified: E78.5

## 2024-07-31 SURGERY — CHOLECYSTECTOMY, ROBOT-ASSISTED, LAPAROSCOPIC
Anesthesia: General | Site: Abdomen

## 2024-07-31 MED ORDER — SODIUM CHLORIDE 0.9 % IV SOLN
INTRAVENOUS | Status: AC
  Filled 2024-07-31: qty 2

## 2024-07-31 MED ORDER — DROPERIDOL 2.5 MG/ML IJ SOLN: 0.6250 mg | Freq: Once | INTRAMUSCULAR | Status: DC | PRN

## 2024-07-31 MED ORDER — PROPOFOL 10 MG/ML IV BOLUS
INTRAVENOUS | Status: DC | PRN
  Administered 2024-07-31: 150 mg via INTRAVENOUS

## 2024-07-31 MED ORDER — FENTANYL CITRATE (PF) 100 MCG/2ML IJ SOLN
INTRAMUSCULAR | Status: DC | PRN
  Administered 2024-07-31: 25 ug via INTRAVENOUS
  Administered 2024-07-31: 50 ug via INTRAVENOUS
  Administered 2024-07-31: 25 ug via INTRAVENOUS

## 2024-07-31 MED ORDER — EPHEDRINE SULFATE-NACL 50-0.9 MG/10ML-% IV SOSY
PREFILLED_SYRINGE | INTRAVENOUS | Status: DC | PRN
  Administered 2024-07-31 (×2): 5 mg via INTRAVENOUS

## 2024-07-31 MED ORDER — ACETAMINOPHEN 500 MG PO TABS
ORAL_TABLET | ORAL | Status: AC
  Filled 2024-07-31: qty 2

## 2024-07-31 MED ORDER — ONDANSETRON HCL 4 MG/2ML IJ SOLN: 4.0000 mg | Freq: Four times a day (QID) | INTRAMUSCULAR | Status: DC | PRN

## 2024-07-31 MED ORDER — METOPROLOL SUCCINATE ER 25 MG PO TB24
25.0000 mg | ORAL_TABLET | Freq: Every day | ORAL | Status: DC
  Administered 2024-08-01: 25 mg via ORAL
  Filled 2024-07-31: qty 1

## 2024-07-31 MED ORDER — SODIUM CHLORIDE 0.9 % IV SOLN
1.0000 g | Freq: Two times a day (BID) | INTRAVENOUS | Status: DC
  Administered 2024-07-31 – 2024-08-01 (×2): 1 g via INTRAVENOUS
  Filled 2024-07-31 (×2): qty 1

## 2024-07-31 MED ORDER — DEXAMETHASONE SOD PHOSPHATE PF 10 MG/ML IJ SOLN
INTRAMUSCULAR | Status: DC | PRN
  Administered 2024-07-31: 10 mg via INTRAVENOUS

## 2024-07-31 MED ORDER — SODIUM CHLORIDE 0.9 % IR SOLN
Status: DC | PRN
  Administered 2024-07-31: 1000 mL

## 2024-07-31 MED ORDER — VITAMIN D 25 MCG (1000 UNIT) PO TABS
1000.0000 [IU] | ORAL_TABLET | Freq: Every morning | ORAL | Status: DC
  Administered 2024-08-01: 1000 [IU] via ORAL
  Filled 2024-07-31: qty 1

## 2024-07-31 MED ORDER — BUPIVACAINE-EPINEPHRINE (PF) 0.5% -1:200000 IJ SOLN
INTRAMUSCULAR | Status: DC | PRN
  Administered 2024-07-31: 30 mL

## 2024-07-31 MED ORDER — ONDANSETRON HCL 4 MG/2ML IJ SOLN
INTRAMUSCULAR | Status: DC | PRN
  Administered 2024-07-31: 4 mg via INTRAVENOUS

## 2024-07-31 MED ORDER — ACETAMINOPHEN 500 MG PO TABS
1000.0000 mg | ORAL_TABLET | Freq: Three times a day (TID) | ORAL | Status: DC
  Administered 2024-07-31 – 2024-08-01 (×3): 1000 mg via ORAL
  Filled 2024-07-31 (×3): qty 2

## 2024-07-31 MED ORDER — BUPIVACAINE-EPINEPHRINE (PF) 0.5% -1:200000 IJ SOLN
INTRAMUSCULAR | Status: AC
  Filled 2024-07-31: qty 30

## 2024-07-31 MED ORDER — PHENYLEPHRINE 80 MCG/ML (10ML) SYRINGE FOR IV PUSH (FOR BLOOD PRESSURE SUPPORT)
PREFILLED_SYRINGE | INTRAVENOUS | Status: DC | PRN
  Administered 2024-07-31 (×2): 80 ug via INTRAVENOUS

## 2024-07-31 MED ORDER — FENTANYL CITRATE (PF) 100 MCG/2ML IJ SOLN
INTRAMUSCULAR | Status: AC
  Filled 2024-07-31: qty 2

## 2024-07-31 MED ORDER — OXYCODONE HCL 5 MG/5ML PO SOLN: 5.0000 mg | Freq: Once | ORAL | Status: AC | PRN

## 2024-07-31 MED ORDER — CHLORHEXIDINE GLUCONATE 0.12 % MT SOLN
OROMUCOSAL | Status: AC
  Filled 2024-07-31: qty 15

## 2024-07-31 MED ORDER — OXYCODONE HCL 5 MG PO TABS
ORAL_TABLET | ORAL | Status: AC
  Filled 2024-07-31: qty 1

## 2024-07-31 MED ORDER — FUROSEMIDE 20 MG PO TABS: 40.0000 mg | ORAL_TABLET | Freq: Every day | ORAL | Status: DC

## 2024-07-31 MED ORDER — ROCURONIUM BROMIDE 100 MG/10ML IV SOLN
INTRAVENOUS | Status: DC | PRN
  Administered 2024-07-31: 60 mg via INTRAVENOUS
  Administered 2024-07-31: 20 mg via INTRAVENOUS

## 2024-07-31 MED ORDER — LIDOCAINE HCL (CARDIAC) PF 100 MG/5ML IV SOSY
PREFILLED_SYRINGE | INTRAVENOUS | Status: DC | PRN
  Administered 2024-07-31: 100 mg via INTRAVENOUS

## 2024-07-31 MED ORDER — DEXMEDETOMIDINE HCL IN NACL 80 MCG/20ML IV SOLN
INTRAVENOUS | Status: AC
  Filled 2024-07-31: qty 20

## 2024-07-31 MED ORDER — OXYCODONE HCL 5 MG PO TABS: 5.0000 mg | ORAL_TABLET | ORAL | Status: DC | PRN

## 2024-07-31 MED ORDER — LOSARTAN POTASSIUM 50 MG PO TABS
50.0000 mg | ORAL_TABLET | Freq: Every day | ORAL | Status: DC
  Administered 2024-07-31 – 2024-08-01 (×2): 50 mg via ORAL
  Filled 2024-07-31 (×2): qty 1

## 2024-07-31 MED ORDER — FENTANYL CITRATE (PF) 100 MCG/2ML IJ SOLN: 25.0000 ug | INTRAMUSCULAR | Status: DC | PRN

## 2024-07-31 MED ORDER — OXYCODONE HCL 5 MG PO TABS
5.0000 mg | ORAL_TABLET | Freq: Once | ORAL | Status: AC | PRN
  Administered 2024-07-31: 5 mg via ORAL

## 2024-07-31 MED ORDER — SUGAMMADEX SODIUM 200 MG/2ML IV SOLN
INTRAVENOUS | Status: DC | PRN
  Administered 2024-07-31: 200 mg via INTRAVENOUS

## 2024-07-31 MED ORDER — ROCURONIUM BROMIDE 10 MG/ML (PF) SYRINGE
PREFILLED_SYRINGE | INTRAVENOUS | Status: AC
  Filled 2024-07-31: qty 10

## 2024-07-31 MED ORDER — LEVOTHYROXINE SODIUM 50 MCG PO TABS
50.0000 ug | ORAL_TABLET | Freq: Every day | ORAL | Status: DC
  Administered 2024-08-01: 50 ug via ORAL
  Filled 2024-07-31: qty 1
  Filled 2024-07-31: qty 2

## 2024-07-31 MED ORDER — ACETAMINOPHEN 10 MG/ML IV SOLN: 1000.0000 mg | Freq: Once | INTRAVENOUS | Status: DC | PRN

## 2024-07-31 MED ORDER — MORPHINE SULFATE (PF) 2 MG/ML IV SOLN: 2.0000 mg | INTRAVENOUS | Status: DC | PRN

## 2024-07-31 MED ORDER — LIDOCAINE HCL (PF) 2 % IJ SOLN
INTRAMUSCULAR | Status: AC
  Filled 2024-07-31: qty 5

## 2024-07-31 MED ORDER — INDOCYANINE GREEN 25 MG IJ SOLR
INTRAMUSCULAR | Status: AC
  Filled 2024-07-31: qty 10

## 2024-07-31 MED ORDER — FLUTICASONE FUROATE-VILANTEROL 200-25 MCG/ACT IN AEPB
1.0000 | INHALATION_SPRAY | Freq: Every day | RESPIRATORY_TRACT | Status: DC
  Filled 2024-07-31: qty 28

## 2024-07-31 MED ORDER — 0.9 % SODIUM CHLORIDE (POUR BTL) OPTIME
TOPICAL | Status: DC | PRN
  Administered 2024-07-31: 500 mL

## 2024-07-31 MED ORDER — ONDANSETRON 4 MG PO TBDP: 4.0000 mg | ORAL_TABLET | Freq: Four times a day (QID) | ORAL | Status: DC | PRN

## 2024-07-31 MED ORDER — ONDANSETRON HCL 4 MG/2ML IJ SOLN
INTRAMUSCULAR | Status: AC
  Filled 2024-07-31: qty 2

## 2024-07-31 SURGICAL SUPPLY — 42 items
BAG PRESSURE INF REUSE 1000 (BAG) IMPLANT
CANNULA REDUCER 12-8 DVNC XI (CANNULA) ×1 IMPLANT
CAUTERY HOOK MNPLR 1.6 DVNC XI (INSTRUMENTS) ×1 IMPLANT
CLIP APPLIE 32.77 55 M/L DVNC (INSTRUMENTS) IMPLANT
CLIP LIGATING HEMO O LOK GREEN (MISCELLANEOUS) ×1 IMPLANT
DEFOGGER SCOPE WARM SEASHARP (MISCELLANEOUS) ×1 IMPLANT
DERMABOND ADVANCED .7 DNX12 (GAUZE/BANDAGES/DRESSINGS) ×1 IMPLANT
DRAIN CHANNEL JP 19F RND 3/16 (MISCELLANEOUS) IMPLANT
DRAPE ARM DVNC X/XI (DISPOSABLE) ×4 IMPLANT
DRAPE COLUMN DVNC XI (DISPOSABLE) ×1 IMPLANT
DRSG TEGADERM 4X4.75 (GAUZE/BANDAGES/DRESSINGS) IMPLANT
ELECTRODE REM PT RTRN 9FT ADLT (ELECTROSURGICAL) ×1 IMPLANT
EVACUATOR SILICONE 100CC (DRAIN) IMPLANT
FORCEPS BPLR 8 MD DVNC XI (FORCEP) IMPLANT
FORCEPS BPLR R/ABLATION 8 DVNC (INSTRUMENTS) ×1 IMPLANT
FORCEPS PROGRASP DVNC XI (FORCEP) ×1 IMPLANT
GLOVE BIOGEL PI IND STRL 7.5 (GLOVE) ×2 IMPLANT
GLOVE SURG SYN 7.0 PF PI (GLOVE) ×2 IMPLANT
GOWN STRL REUS W/ TWL LRG LVL3 (GOWN DISPOSABLE) ×3 IMPLANT
GRASPER SUT TROCAR 14GX15 (MISCELLANEOUS) ×1 IMPLANT
IRRIGATOR SUCT 8 DISP DVNC XI (IRRIGATION / IRRIGATOR) IMPLANT
IV 0.9% NACL 1000 ML (IV SOLUTION) IMPLANT
KIT PINK PAD W/HEAD ARM REST (MISCELLANEOUS) ×1 IMPLANT
LABEL OR SOLS (LABEL) ×1 IMPLANT
MANIFOLD NEPTUNE II (INSTRUMENTS) ×1 IMPLANT
NDL HYPO 22X1.5 SAFETY MO (MISCELLANEOUS) ×1 IMPLANT
NDL INSUFFLATION 14GA 120MM (NEEDLE) ×1 IMPLANT
NS IRRIG 500ML POUR BTL (IV SOLUTION) ×1 IMPLANT
OBTURATOR OPTICALSTD 8 DVNC (TROCAR) ×1 IMPLANT
PACK LAP CHOLECYSTECTOMY (MISCELLANEOUS) ×1 IMPLANT
SCISSORS LAP 5X35 DISP (ENDOMECHANICALS) IMPLANT
SEAL UNIV 5-12 XI (MISCELLANEOUS) ×4 IMPLANT
SET TUBE SMOKE EVAC HIGH FLOW (TUBING) ×1 IMPLANT
SOLN STERILE WATER 500 ML (IV SOLUTION) ×1 IMPLANT
SOLUTION ELECTROSURG ANTI STCK (MISCELLANEOUS) ×1 IMPLANT
SPIKE FLUID TRANSFER (MISCELLANEOUS) ×2 IMPLANT
SPONGE DRAIN TRACH 4X4 STRL 2S (GAUZE/BANDAGES/DRESSINGS) IMPLANT
SUT ETHILON 3-0 (SUTURE) IMPLANT
SUT VIC AB 4-0 FS2 27 (SUTURE) IMPLANT
SUT VICRYL 0 UR6 27IN ABS (SUTURE) ×1 IMPLANT
SUTURE MNCRL 4-0 27XMF (SUTURE) ×1 IMPLANT
SYSTEM BAG RETRIEVAL 10MM (BASKET) ×1 IMPLANT

## 2024-07-31 NOTE — Anesthesia Procedure Notes (Signed)
 Procedure Name: Intubation Date/Time: 07/31/2024 7:43 AM  Performed by: Dominica Krabbe, CRNAPre-anesthesia Checklist: Emergency Drugs available, Patient identified, Suction available, Timeout performed and Patient being monitored Patient Re-evaluated:Patient Re-evaluated prior to induction Oxygen Delivery Method: Circle system utilized Preoxygenation: Pre-oxygenation with 100% oxygen Induction Type: IV induction Ventilation: Mask ventilation without difficulty and Oral airway inserted - appropriate to patient size Laryngoscope Size: McGrath and 3 Grade View: Grade I Tube type: Oral Tube size: 7.5 mm Number of attempts: 1 Airway Equipment and Method: Stylet and Video-laryngoscopy Placement Confirmation: ETT inserted through vocal cords under direct vision, positive ETCO2 and breath sounds checked- equal and bilateral Secured at: 21 cm Tube secured with: Tape Dental Injury: Teeth and Oropharynx as per pre-operative assessment

## 2024-07-31 NOTE — H&P (Signed)
 Patient has undergone cardiac and pulmonary restratification.  He has also been reviewed by the anesthesia team.  He understands he is at increased risk given his health comorbidities.  I again discussed the risk, benefits alternatives of the procedure.  He wishes to proceed as below  Kenneth Larsen is an 78 y.o. male.       Chief Complaint  Patient presents with   Follow-up      HPI: Kenneth Larsen is a 78 year old male with past medical history significant for aortic stenosis status post TAVR, congestive heart failure and COPD on 2 to 3 L nasal cannula who presents in consultation for cholecystitis.  The patient had cholecystitis back in September.  At that time he had a cholecystostomy tube.  He Eilene presents today after having an IR tube exchange.  Clinically he reports doing well.  He denies any abdominal pain.  He denies any nausea or vomiting.  He is eating and having normal bowel function.  He says that his oxygen requirements have not increased.  He reports that his swelling in his legs has improved a little bit.       Past Medical History:  Diagnosis Date   Acute respiratory failure with hypoxia (HCC) 05/07/2022   Arthritis     CAD (coronary artery disease)      a. 09/2023 Cath: LM 15d, LAD 40p, LCX 64m, OM1 min irregs, RCA 40p, 30d.   Cancer (HCC)      2010-melanoma   Chronic diastolic (congestive) heart failure (HCC)     COPD (chronic obstructive pulmonary disease) (HCC)     Elevated troponin 05/06/2022   Hypertension     Hyponatremia 05/06/2022   Hypothyroidism     Hypoxemia     Respiratory failure (HCC) 10/08/2018   Severe aortic stenosis      a. 10/2018 s/p TAVR w/ Edwards Sapien 3 29 mm AoV; b. 04/2022 Echo: EF 55-60%, no rwma, Gri DD, nl RV fxn, RVSP 28.51mmHg. No AS/AI.               Past Surgical History:  Procedure Laterality Date   CARDIAC CATHETERIZATION       IR EXCHANGE BILIARY DRAIN   07/06/2024   IR PERC CHOLECYSTOSTOMY   05/19/2024   RIGHT/LEFT HEART CATH  AND CORONARY ANGIOGRAPHY N/A 10/13/2018    Procedure: RIGHT/LEFT HEART CATH AND CORONARY ANGIOGRAPHY;  Surgeon: Mady Bruckner, MD;  Location: [facility] INVASIVE CV LAB;  Service: Cardiovascular;  Laterality: N/A;   TEE WITHOUT CARDIOVERSION N/A 11/15/2018    Procedure: TRANSESOPHAGEAL ECHOCARDIOGRAM (TEE);  Surgeon: Verlin Bruckner BIRCH, MD;  Location: [facility] INVASIVE CV LAB;  Service: Open Heart Surgery;  Laterality: N/A;   TRANSCATHETER AORTIC VALVE REPLACEMENT, TRANSFEMORAL   11/15/2018   TRANSCATHETER AORTIC VALVE REPLACEMENT, TRANSFEMORAL N/A 11/15/2018    Procedure: TRANSCATHETER AORTIC VALVE REPLACEMENT, TRANSFEMORAL;  Surgeon: Verlin Bruckner BIRCH, MD;  Location: [facility] INVASIVE CV LAB;  Service: Open Heart Surgery;  Laterality: N/A;   VASECTOMY                   Family History  Problem Relation Age of Onset   Cancer Father            Social History:  reports that he has quit smoking. His smoking use included cigarettes. He has a 25 pack-year smoking history. He has been exposed to tobacco smoke. He has never used smokeless tobacco. He reports that he does not drink alcohol and does not use drugs.  Allergies:  Allergies       Allergies  Allergen Reactions   Penicillins Hives      Did it involve swelling of the face/tongue/throat, SOB, or low BP? No Did it involve sudden or severe rash/hives, skin peeling, or any reaction on the inside of your mouth or nose? No Did you need to seek medical attention at a hospital or doctor's office? No When did it last happen?      childhood allergy If all above answers are NO, may proceed with cephalosporin use.          Medications reviewed.       ROS Full ROS performed and is otherwise negative other than what is stated in HPI     BP 108/61   Pulse 72   Ht 6' 1 (1.854 m)   Wt 198 lb (89.8 kg)   SpO2 98%   BMI 26.12 kg/m    Physical Exam On 3 L nasal cannula, alert and oriented, abdomen is soft, slightly protuberant,  nontender and nondistended, cholecystostomy tube in place with bile in the bag.       I reviewed his labs and his hemoglobin has improved with iron  infusions.  I have also reviewed his notes from his pulmonologist as well as his cardiologist.  There are no further tests that need to be done prior to any surgical intervention and he is at obviously increased risk of complications.     I have reviewed the interventional radiology tube exchange.  He has multiple stones in the gallbladder and the cystic duct is not open.   Assessment/Plan:   I had a very lengthy discussion with the patient, his wife and his son who was conferencing via phone.  I discussed our options are to continue to wait and see if the cystic duct will open and then we could potentially perform right clamping trial.  I also discussed with him that some people have a cholecystostomy tube for the rest of their lives.  We also discussed the role of surgical intervention.  I shared with him the ACS and his clip risk calculator that shows that he has a 20% risk of serious complication and 7% risk of death.  I discussed that these percentages are not prohibitive from a surgical perspective although he is indeed at very increased risk.  The patient is adamant that he wants surgery to get the tube out.  I had a long discussion with the patient about the risk, benefits alternatives of the procedure including risk of infection, bleeding, injury to common bile duct, retained stone, bile leak, need for either open procedure or subtotal cholecystectomy and procedure abortion.  I also discussed the risk of complications including risk of cardiac problems, need for intubation postoperatively and death.  I discussed with the patient that we would resend his DNR during the procedure but he needs to talk about what he would want should he code or need a tracheostomy.  The patient is fully aware of the high risk of this procedure but again wishes to proceed.   He has already undergone pulmonary risk stratification as well as cardiology risk stratification.  We will have anesthesia review his chart.  We will plan for robotic assisted cholecystectomy.   A total of 55 minutes was spent reviewing the patient's chart, performing an interval history and physical and discussing treatment options with the patient.     Jayson Endow, M.D. [facility]

## 2024-07-31 NOTE — TOC CM/SW Note (Signed)
 Transition of Care [facility]) - Inpatient Brief Assessment   Patient Details  Name: Kenneth Larsen MRN: [id number] Date of Birth: June 16, 1946  Transition of Care [facility]) CM/SW Contact:    Nathanael CHRISTELLA Ring, RN Phone Number: 07/31/2024, 1:29 PM   Clinical Narrative:   Transition of Care [facility]) Screening Note   Patient Details  Name: Kenneth Larsen Date of Birth: Apr 26, 1946   Transition of Care [facility]) CM/SW Contact:    Nathanael CHRISTELLA Ring, RN Phone Number: 07/31/2024, 1:29 PM    Transition of Care Department [facility]) has reviewed patient and no TOC needs have been identified at this time. If new patient transition needs arise, please place a TOC consult.    Transition of Care Asessment: Insurance and Status: Insurance coverage has been reviewed Patient has primary care physician: Yes Home environment has been reviewed: Yes home with wife   Prior/Current Home Services: No current home services Social Drivers of Health Review: SDOH reviewed no interventions necessary Readmission risk has been reviewed: No (NA) Transition of care needs: no transition of care needs at this time

## 2024-07-31 NOTE — Transfer of Care (Signed)
 Immediate Anesthesia Transfer of Care Note  Patient: Kenneth Larsen  Procedure(s) Performed: CHOLECYSTECTOMY, ROBOT-ASSISTED, LAPAROSCOPIC (Abdomen)  Patient Location: PACU  Anesthesia Type:General  Level of Consciousness: awake, alert , and oriented  Airway & Oxygen Therapy: Patient Spontanous Breathing and Patient connected to face mask oxygen  Post-op Assessment: Report given to RN and Post -op Vital signs reviewed and stable  Post vital signs: Reviewed and stable  Last Vitals:  Vitals Value Taken Time  BP 153/65 07/31/24 10:04  Temp 37.1 C 07/31/24 10:04  Pulse 63 07/31/24 10:07  Resp 33 07/31/24 10:07  SpO2 100 % 07/31/24 10:07  Vitals shown include unfiled device data.  Last Pain:  Vitals:   07/31/24 0640  TempSrc: Temporal  PainSc: 0-No pain         Complications: No notable events documented.

## 2024-07-31 NOTE — Anesthesia Postprocedure Evaluation (Signed)
 Anesthesia Post Note  Patient: Kenneth Larsen  Procedure(s) Performed: CHOLECYSTECTOMY, ROBOT-ASSISTED, LAPAROSCOPIC (Abdomen)  Patient location during evaluation: PACU Anesthesia Type: General Level of consciousness: awake and alert Pain management: pain level controlled Vital Signs Assessment: post-procedure vital signs reviewed and stable Respiratory status: spontaneous breathing, nonlabored ventilation and respiratory function stable Cardiovascular status: blood pressure returned to baseline and stable Postop Assessment: no apparent nausea or vomiting Anesthetic complications: no   No notable events documented.   Last Vitals:  Vitals:   07/31/24 1245 07/31/24 1327  BP: (!) 139/94 (!) 148/63  Pulse: 72 70  Resp: 18 18  Temp:  (!) 36.2 C  SpO2: 100% 100%    Last Pain:  Vitals:   07/31/24 1327  TempSrc: Temporal  PainSc: 1                  Camellia Merilee Louder

## 2024-07-31 NOTE — Op Note (Signed)
 Robotic assisted laparoscopic Cholecystectomy  Pre-operative Diagnosis: Acute Cholecystitis  Post-operative Diagnosis: Acute Cholecystitis   Procedure:  Robotic assisted laparoscopic Cholecystectomy  Surgeon: Jayson Endow, MD  Anesthesia: Gen. 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   Estimated Blood Loss: 20cc       Specimens: Gallbladder           Complications: none   Procedure Details  The patient was seen again in the Holding Room. The benefits, complications, treatment options, and expected outcomes were discussed with the patient. The risks of bleeding, infection, recurrence of symptoms, failure to resolve symptoms, bile duct damage, bile duct leak, retained common bile duct stone, bowel injury, any of which could require further surgery and/or ERCP, stent, or papillotomy were reviewed with the patient. The likelihood of improving the patient's symptoms with return to their baseline status is good.  The patient and/or family concurred with the proposed plan, giving informed consent.  The patient was taken to Operating Room, identified  and the procedure verified as robotic Cholecystectomy.  A Time Out was held and the above information confirmed.  Prior to the induction of general anesthesia, antibiotic prophylaxis was administered. VTE prophylaxis was in place. General endotracheal anesthesia was then administered and tolerated well. After the induction, the abdomen was prepped with Chloraprep and draped in the sterile fashion. The patient was positioned in the supine position.  A veress needle was inserted into the abdomen using standard drop technique. An 8mm infra-umbilical robotic port was then placed under direct visualization. There was no injury noted at the site of veress needle insertion. Two right sided abdominal 8mm ports followed  by an 8mm left abdominal robotic ports were placed under direct visualization. The left sided abdominal port was then upsized to a 12mm robotic port.  The patient was positioned  in reverse Trendelenburg, robot was brought to the surgical field and docked in the standard fashion.  We made sure all the instrumentation was kept indirect view at all times and that there were no collision between the arms. I scrubbed out and went to the console.  There were adhesions to the abdominal wall between the gallbladder.  These were taken down.  The gallbladder was identified, the fundus grasped and retracted cephalad. Adhesions were lysed bluntly. The infundibulum was grasped and retracted laterally, exposing the peritoneum overlying the triangle of Calot. This was then divided and exposed in a blunt fashion. An extended critical view of the cystic duct and cystic artery was obtained.  In order to do this in 2 adequately obtain the critical view of safety I did take down the fistula between the gallbladder and the anterior abdominal wall.  Once this was taken down the cholecystostomy tube was removed.  The cystic duct was clearly identified and bluntly dissected.   Artery and duct were double clipped and divided. Using ICG cholangiography we visualized the cystic duct. The gallbladder was taken from the gallbladder fossa in a retrograde fashion with the electrocautery.  Hemostasis was achieved with the electrocautery. nspection of the right upper quadrant was performed. No bleeding, bile duct injury or leak, or bowel injury was noted.  I elected to place a 19 French Blake drain into the right upper quadrant.  It was inserted through the right most lateral robotic port site. Robotic instruments and robotic arms were undocked in the standard fashion.  I scrubbed back in.  The gallbladder was removed and placed in an Endocatch bag.   The left lower quadrant fascia was then closed with a 0 vicryl using a suture needle  passer.  The drain was secured with a 2-0 nylon suture.  The pre-peritoneal space was then infiltrated with marcaine  solution. Pneumoperitoneum was released.  4-0 subcuticular Monocryl was used to close the skin. Dermabond was  applied.  The patient was then extubated and brought to the recovery room in stable condition. Sponge, lap, and needle counts were correct at closure and at the conclusion of the case.               Jayson Endow, M.D. [facility]

## 2024-07-31 NOTE — Plan of Care (Signed)
  Problem: Clinical Measurements: Goal: Cardiovascular complication will be avoided Outcome: Progressing   Problem: Activity: Goal: Risk for activity intolerance will decrease Outcome: Progressing   Problem: Nutrition: Goal: Adequate nutrition will be maintained Outcome: Progressing   Problem: Coping: Goal: Level of anxiety will decrease Outcome: Progressing   Problem: Pain Managment: Goal: General experience of comfort will improve and/or be controlled Outcome: Progressing   Problem: Safety: Goal: Ability to remain free from injury will improve Outcome: Progressing

## 2024-08-01 ENCOUNTER — Encounter: Payer: Self-pay | Admitting: General Surgery

## 2024-08-01 DIAGNOSIS — K8012 Calculus of gallbladder with acute and chronic cholecystitis without obstruction: Secondary | ICD-10-CM | POA: Diagnosis not present

## 2024-08-01 LAB — SURGICAL PATHOLOGY

## 2024-08-01 NOTE — Care Management Obs Status (Signed)
 MEDICARE OBSERVATION STATUS NOTIFICATION   Patient Details  Name: Kenneth Larsen MRN: [id number] Date of Birth: Apr 06, 1946   Medicare Observation Status Notification Given:  No Patient was discharged.   Rojelio SHAUNNA Rattler 08/01/2024, 12:12 PM

## 2024-08-01 NOTE — Progress Notes (Signed)
 DISCHARGE NOTE:   Pt dc with IV removed and dc instructions given. Pt dc with 3L Nasal Cannula. Nurse changed JP drain dressing. Pt and Pt's wife educated on JP drain emptying and education about recording measurement. Pt voices no questions or concerns at this time. Pt wheeled down to medical mall entrance by staff. Pt's daughter provided transportation.

## 2024-08-01 NOTE — Plan of Care (Signed)

## 2024-08-01 NOTE — Plan of Care (Signed)
   Problem: Education: Goal: Knowledge of General Education information will improve Description Including pain rating scale, medication(s)/side effects and non-pharmacologic comfort measures Outcome: Progressing

## 2024-08-01 NOTE — Discharge Instructions (Signed)
 In addition to included general post-operative instructions,  Diet: Resume home diet. Recommend avoiding or limiting fatty/greasy foods over the next few days/week. If you do eat these, you may (or may not) notice diarrhea. This is expected while your body adjusts to not having a gallbladder, and it typically resolves with time.    Activity: No heavy lifting >20 pounds (children, pets, laundry, garbage) or strenuous activity for 4 weeks, but light activity and walking are encouraged. Do not drive or drink alcohol if taking narcotic pain medications or having pain that might distract from driving.  Drain:  Monitor and record drain output daily. Hand out given for this. Please bring this with you to your appointment on 12/17.   Wound care: If you can keep drain site covered, You may shower/get incision wet with soapy water  and pat dry (do not rub incisions), but no baths or submerging incision underwater until follow-up.   Medications: Resume all home medications. For mild to moderate pain: acetaminophen  (Tylenol ) or ibuprofen/naproxen (if no kidney disease). Combining Tylenol  with alcohol can substantially increase your risk of causing liver disease. Narcotic pain medications, if prescribed, can be used for severe pain, though may cause nausea, constipation, and drowsiness. Do not combine Tylenol  and Percocet (or similar) within a 6 hour period as Percocet (and similar) contain(s) Tylenol . If you do not need the narcotic pain medication, you do not need to fill the prescription.  Call office [phone] / [phone]) at any time if any questions, worsening pain, fevers/chills, bleeding, drainage from incision site, or other concerns.

## 2024-08-01 NOTE — Discharge Summary (Signed)
 [facility] SURGICAL DISCHARGE SUMMARY  [name] MRN: [id number] DOB/AGE: 30-Aug-1945 78 y.o.  Admit date: 07/31/2024 Discharge date: 08/01/2024  Discharge Diagnoses Patient Active Problem List   Diagnosis Date Noted   Acute cholecystitis 05/18/2024    Consultants None  Procedures 07/31/2024: Robotic assisted laparoscopic cholecystectomy    HPI: Kenneth Larsen is a 78 y.o. male with history of cholecystitis s/p percutaneous cholecystostomy tube placement who presents to [facility] on 12/08 for scheduled cholecystectomy   Hospital Course: Informed consent was obtained and documented, and patient underwent uneventful robotic assisted laparoscopic cholecystectomy (Dr Marinda, 07/31/2024).  Post-operatively, patient's pain/symptoms improved/resolved and advancement of patient's diet and ambulation were well-tolerated. The remainder of patient's hospital course was essentially unremarkable, and discharge planning was initiated accordingly with patient safely able to be discharged home with appropriate discharge instructions, pain control, and outpatient follow-up after all of his questions were answered to his expressed satisfaction.   Discharge Condition: Good   Physical Examination:  Constitutional: Well appearing male, NAD Pulmonary: Normal effort, no respiratory distress Gastrointestinal: Soft, incisional soreness, non-distended, no rebound/guarding. Surgical drain in right lateral abdomen; output serosanguinous Skin: Laparoscopic incisions are CDI with dermabond, no erythema or drainage    Allergies as of 08/01/2024       Reactions   Penicillins Hives   Childhood allergy   Tape Rash   Broke out         Medication List     TAKE these medications    acetaminophen  500 MG tablet Commonly known as: TYLENOL  Take 500-1,000 mg by mouth every 6 (six) hours as needed (pain.).   cyanocobalamin  1000 MCG tablet Commonly known as: VITAMIN B12 Take  1,000 mcg by mouth in the morning.   furosemide  40 MG tablet Commonly known as: Lasix  Take 1 tablet (40 mg total) by mouth daily. May take an extra 1 tablet ( 40 MG ) daily as needed for leg swelling.   levothyroxine  50 MCG tablet Commonly known as: SYNTHROID  Take 50 mcg by mouth daily before breakfast.   losartan  50 MG tablet Commonly known as: COZAAR  Take 1 tablet by mouth once daily   metoprolol  succinate 25 MG 24 hr tablet Commonly known as: TOPROL -XL Take 1 tablet by mouth once daily   mometasone -formoterol  200-5 MCG/ACT Aero Commonly known as: DULERA  Inhale 2 puffs into the lungs 2 (two) times daily.   OXYGEN Inhale 3 L/min into the lungs continuous.   sodium chloride  flush 0.9 % Soln injection 5-10 mLs by Intracatheter route daily.   Vitamin D -1000 Max St 25 MCG (1000 UT) tablet Generic drug: Cholecalciferol  Take 1,000 Units by mouth in the morning.          Follow-up Information     Laurella Tull R, PA-C. Go on 08/09/2024.   Specialty: Physician Assistant Why: Go to appointment on 12/17 at 230 PM Contact information: [address] [place] [address] [phone]                  Time spent on discharge management including discussion of hospital course, clinical condition, outpatient instructions, prescriptions, and follow up with the patient and members of the medical team: >30 minutes  -- Arthea Platt , PA-C [facility]  08/01/2024, 9:18 AM [phone] M-F: 7am - 4pm

## 2024-08-09 ENCOUNTER — Encounter: Payer: Self-pay | Admitting: Physician Assistant

## 2024-08-09 ENCOUNTER — Ambulatory Visit: Admitting: Physician Assistant

## 2024-08-09 VITALS — BP 148/63 | HR 78 | Temp 98.3°F | Ht 73.0 in | Wt 205.4 lb

## 2024-08-09 DIAGNOSIS — Z09 Encounter for follow-up examination after completed treatment for conditions other than malignant neoplasm: Secondary | ICD-10-CM

## 2024-08-09 DIAGNOSIS — K810 Acute cholecystitis: Secondary | ICD-10-CM

## 2024-08-09 NOTE — Progress Notes (Signed)
 [facility] POST-OP OFFICE VISIT  08/09/2024  HPI: Kenneth Larsen is a 78 y.o. male 7 days s/p robotic assisted laparoscopic cholecystectomy for acute cholecystitis with Dr Marinda  He has done markedly well No issues with pain No fever, chills, nausea, emesis He has had sporadic diarrhea but believes this is secondary to his diet Drain frankly serous  Incisions healing well No other complaints   Vital signs: BP (!) 148/63   Pulse 78   Temp 98.3 F (36.8 C) (Oral)   Ht 6' 1 (1.854 m)   Wt 205 lb 6.4 oz (93.2 kg)   SpO2 96%   BMI 27.10 kg/m    Physical Exam: Constitutional: Well appearing male, NAD Abdomen: Soft, non-tender, non-distended, no rebound/guarding. Drain in right abdomen; serous (removed) Skin: Laparoscopic incisions are healing well, no erythema or drainage   Assessment/Plan: This is a 78 y.o. male 7 days s/p robotic assisted laparoscopic cholecystectomy for acute cholecystitis with Dr Marinda   - Drain removed; dressing placed; no issues - reviewed care  - Pain control prn; OTC medications should be sufficient if needed  - Reviewed wound care recommendation  - Reviewed lifting restrictions; 4 weeks total  - Reviewed surgical pathology; Chronic and subacute cholecystitis   - He is doing well enough I do think it is reasonable he follows up on as needed basis; He understands to call with questions/concerns  -- Arthea Platt, PA-C [facility] 08/09/2024, 2:22 PM M-F: 7am - 4pm

## 2024-08-09 NOTE — Patient Instructions (Signed)

## 2024-10-09 ENCOUNTER — Inpatient Hospital Stay

## 2024-10-10 ENCOUNTER — Inpatient Hospital Stay: Admitting: Oncology

## 2024-10-10 ENCOUNTER — Inpatient Hospital Stay

## 2025-01-22 ENCOUNTER — Ambulatory Visit
